# Patient Record
Sex: Male | Born: 1965 | Race: White | Hispanic: No | Smoking: Never smoker
Health system: Southern US, Community
[De-identification: ages and names within clinical notes are randomized; demographics above are authoritative.]

## PROBLEM LIST (undated history)

## (undated) DIAGNOSIS — J309 Allergic rhinitis, unspecified: Secondary | ICD-10-CM

## (undated) DIAGNOSIS — K5733 Diverticulitis of large intestine without perforation or abscess with bleeding: Secondary | ICD-10-CM

## (undated) DIAGNOSIS — K5790 Diverticulosis of intestine, part unspecified, without perforation or abscess without bleeding: Secondary | ICD-10-CM

## (undated) DIAGNOSIS — G4733 Obstructive sleep apnea (adult) (pediatric): Secondary | ICD-10-CM

## (undated) DIAGNOSIS — S82891A Other fracture of right lower leg, initial encounter for closed fracture: Secondary | ICD-10-CM

## (undated) DIAGNOSIS — R972 Elevated prostate specific antigen [PSA]: Secondary | ICD-10-CM

## (undated) DIAGNOSIS — R079 Chest pain, unspecified: Secondary | ICD-10-CM

## (undated) DIAGNOSIS — F411 Generalized anxiety disorder: Secondary | ICD-10-CM

## (undated) DIAGNOSIS — E785 Hyperlipidemia, unspecified: Secondary | ICD-10-CM

## (undated) DIAGNOSIS — R413 Other amnesia: Secondary | ICD-10-CM

## (undated) DIAGNOSIS — K219 Gastro-esophageal reflux disease without esophagitis: Secondary | ICD-10-CM

## (undated) DIAGNOSIS — I10 Essential (primary) hypertension: Secondary | ICD-10-CM

## (undated) DIAGNOSIS — F988 Other specified behavioral and emotional disorders with onset usually occurring in childhood and adolescence: Secondary | ICD-10-CM

## (undated) DIAGNOSIS — G4700 Insomnia, unspecified: Secondary | ICD-10-CM

## (undated) DIAGNOSIS — N529 Male erectile dysfunction, unspecified: Secondary | ICD-10-CM

## (undated) HISTORY — DX: Insomnia, unspecified: G47.00

## (undated) HISTORY — DX: Diverticulitis of large intestine without perforation or abscess with bleeding: K57.33

## (undated) HISTORY — DX: Obstructive sleep apnea (adult) (pediatric): G47.33

## (undated) HISTORY — DX: Gastro-esophageal reflux disease without esophagitis: K21.9

## (undated) HISTORY — DX: Other specified behavioral and emotional disorders with onset usually occurring in childhood and adolescence: F98.8

## (undated) HISTORY — DX: Diverticulosis of intestine, part unspecified, without perforation or abscess without bleeding: K57.90

## (undated) HISTORY — PX: TONSILLECTOMY: SUR1361

## (undated) HISTORY — DX: Generalized anxiety disorder: F41.1

## (undated) HISTORY — DX: Elevated prostate specific antigen (PSA): R97.20

## (undated) HISTORY — PX: NASAL SINUS SURGERY: SHX719

## (undated) HISTORY — DX: Hyperlipidemia, unspecified: E78.5

## (undated) HISTORY — DX: Chest pain, unspecified: R07.9

## (undated) HISTORY — DX: Other amnesia: R41.3

## (undated) HISTORY — DX: Allergic rhinitis, unspecified: J30.9

## (undated) HISTORY — DX: Male erectile dysfunction, unspecified: N52.9

---

## 2004-08-24 ENCOUNTER — Ambulatory Visit: Payer: Self-pay | Admitting: Internal Medicine

## 2005-01-13 ENCOUNTER — Encounter: Payer: Self-pay | Admitting: Pulmonary Disease

## 2005-01-13 ENCOUNTER — Ambulatory Visit (HOSPITAL_BASED_OUTPATIENT_CLINIC_OR_DEPARTMENT_OTHER): Admission: RE | Admit: 2005-01-13 | Discharge: 2005-01-13 | Payer: Self-pay | Admitting: Otolaryngology

## 2005-01-16 ENCOUNTER — Ambulatory Visit: Payer: Self-pay | Admitting: Internal Medicine

## 2005-02-22 ENCOUNTER — Ambulatory Visit: Payer: Self-pay | Admitting: Internal Medicine

## 2005-03-23 ENCOUNTER — Ambulatory Visit: Payer: Self-pay | Admitting: Internal Medicine

## 2005-05-10 ENCOUNTER — Ambulatory Visit: Payer: Self-pay | Admitting: Pulmonary Disease

## 2005-07-29 ENCOUNTER — Ambulatory Visit: Payer: Self-pay | Admitting: Internal Medicine

## 2005-08-16 ENCOUNTER — Ambulatory Visit: Payer: Self-pay | Admitting: Internal Medicine

## 2005-11-24 ENCOUNTER — Ambulatory Visit: Payer: Self-pay | Admitting: Internal Medicine

## 2006-06-02 ENCOUNTER — Ambulatory Visit: Payer: Self-pay | Admitting: Internal Medicine

## 2006-07-06 ENCOUNTER — Ambulatory Visit: Payer: Self-pay | Admitting: Internal Medicine

## 2006-07-14 ENCOUNTER — Ambulatory Visit: Payer: Self-pay | Admitting: Internal Medicine

## 2006-11-08 ENCOUNTER — Ambulatory Visit: Payer: Self-pay | Admitting: Internal Medicine

## 2007-05-02 ENCOUNTER — Ambulatory Visit: Payer: Self-pay | Admitting: Internal Medicine

## 2007-05-03 ENCOUNTER — Ambulatory Visit: Payer: Self-pay | Admitting: Internal Medicine

## 2007-05-03 DIAGNOSIS — F988 Other specified behavioral and emotional disorders with onset usually occurring in childhood and adolescence: Secondary | ICD-10-CM | POA: Insufficient documentation

## 2007-05-03 DIAGNOSIS — G4733 Obstructive sleep apnea (adult) (pediatric): Secondary | ICD-10-CM | POA: Insufficient documentation

## 2007-05-03 DIAGNOSIS — F411 Generalized anxiety disorder: Secondary | ICD-10-CM

## 2007-05-03 HISTORY — DX: Obstructive sleep apnea (adult) (pediatric): G47.33

## 2007-05-03 HISTORY — DX: Other specified behavioral and emotional disorders with onset usually occurring in childhood and adolescence: F98.8

## 2007-05-03 HISTORY — DX: Generalized anxiety disorder: F41.1

## 2007-05-29 ENCOUNTER — Encounter: Payer: Self-pay | Admitting: Internal Medicine

## 2007-07-31 ENCOUNTER — Ambulatory Visit: Payer: Self-pay | Admitting: Internal Medicine

## 2007-07-31 LAB — CONVERTED CEMR LAB
Albumin: 4.4 g/dL (ref 3.5–5.2)
Alkaline Phosphatase: 65 units/L (ref 39–117)
BUN: 9 mg/dL (ref 6–23)
Basophils Absolute: 0 10*3/uL (ref 0.0–0.1)
Bilirubin Urine: NEGATIVE
Creatinine, Ser: 1 mg/dL (ref 0.4–1.5)
Eosinophils Absolute: 0.1 10*3/uL (ref 0.0–0.6)
GFR calc Af Amer: 105 mL/min
GFR calc non Af Amer: 87 mL/min
HDL: 30.9 mg/dL — ABNORMAL LOW (ref >39.0)
Hemoglobin, Urine: NEGATIVE
Hemoglobin: 16.7 g/dL (ref 13.0–17.0)
Ketones, ur: NEGATIVE mg/dL
LDL Cholesterol: 94 mg/dL (ref 0–99)
Leukocytes, UA: NEGATIVE
MCHC: 34.4 g/dL (ref 30.0–36.0)
Monocytes Absolute: 0.6 10*3/uL (ref 0.2–0.7)
Monocytes Relative: 10.6 % (ref 3.0–11.0)
Potassium: 4.2 meq/L (ref 3.5–5.1)
RDW: 12.3 % (ref 11.5–14.6)
TSH: 2.46 microintl units/mL (ref 0.35–5.50)
Total Bilirubin: 0.9 mg/dL (ref 0.3–1.2)
Triglycerides: 113 mg/dL (ref 0–149)
VLDL: 23 mg/dL (ref 0–40)
pH: 7 (ref 5.0–8.0)

## 2007-08-01 ENCOUNTER — Encounter: Payer: Self-pay | Admitting: Internal Medicine

## 2007-08-01 ENCOUNTER — Ambulatory Visit: Payer: Self-pay | Admitting: Internal Medicine

## 2007-08-01 DIAGNOSIS — E785 Hyperlipidemia, unspecified: Secondary | ICD-10-CM | POA: Insufficient documentation

## 2007-08-01 HISTORY — DX: Hyperlipidemia, unspecified: E78.5

## 2007-10-17 ENCOUNTER — Ambulatory Visit: Payer: Self-pay | Admitting: Pulmonary Disease

## 2007-10-17 DIAGNOSIS — G4700 Insomnia, unspecified: Secondary | ICD-10-CM

## 2007-10-17 HISTORY — DX: Insomnia, unspecified: G47.00

## 2007-10-24 ENCOUNTER — Telehealth: Payer: Self-pay | Admitting: Internal Medicine

## 2007-10-24 ENCOUNTER — Telehealth: Payer: Self-pay | Admitting: Pulmonary Disease

## 2007-10-24 ENCOUNTER — Telehealth (INDEPENDENT_AMBULATORY_CARE_PROVIDER_SITE_OTHER): Payer: Self-pay | Admitting: *Deleted

## 2007-10-26 ENCOUNTER — Encounter: Payer: Self-pay | Admitting: Internal Medicine

## 2007-10-29 ENCOUNTER — Telehealth (INDEPENDENT_AMBULATORY_CARE_PROVIDER_SITE_OTHER): Payer: Self-pay | Admitting: *Deleted

## 2007-11-16 ENCOUNTER — Encounter: Payer: Self-pay | Admitting: Internal Medicine

## 2007-11-21 ENCOUNTER — Telehealth (INDEPENDENT_AMBULATORY_CARE_PROVIDER_SITE_OTHER): Payer: Self-pay | Admitting: *Deleted

## 2007-11-23 ENCOUNTER — Ambulatory Visit: Payer: Self-pay | Admitting: Internal Medicine

## 2007-11-23 DIAGNOSIS — R413 Other amnesia: Secondary | ICD-10-CM | POA: Insufficient documentation

## 2007-11-23 HISTORY — DX: Other amnesia: R41.3

## 2007-11-25 DIAGNOSIS — J309 Allergic rhinitis, unspecified: Secondary | ICD-10-CM

## 2007-11-25 HISTORY — DX: Allergic rhinitis, unspecified: J30.9

## 2007-11-26 ENCOUNTER — Encounter: Payer: Self-pay | Admitting: Pulmonary Disease

## 2007-11-26 ENCOUNTER — Encounter: Payer: Self-pay | Admitting: Internal Medicine

## 2008-02-12 ENCOUNTER — Telehealth (INDEPENDENT_AMBULATORY_CARE_PROVIDER_SITE_OTHER): Payer: Self-pay | Admitting: *Deleted

## 2008-02-20 ENCOUNTER — Telehealth: Payer: Self-pay | Admitting: Internal Medicine

## 2008-02-27 ENCOUNTER — Telehealth (INDEPENDENT_AMBULATORY_CARE_PROVIDER_SITE_OTHER): Payer: Self-pay | Admitting: *Deleted

## 2008-03-28 ENCOUNTER — Ambulatory Visit (HOSPITAL_COMMUNITY): Admission: RE | Admit: 2008-03-28 | Discharge: 2008-03-29 | Payer: Self-pay | Admitting: Otolaryngology

## 2008-06-27 ENCOUNTER — Ambulatory Visit: Payer: Self-pay | Admitting: Internal Medicine

## 2008-12-08 ENCOUNTER — Ambulatory Visit: Payer: Self-pay | Admitting: Internal Medicine

## 2008-12-08 DIAGNOSIS — J019 Acute sinusitis, unspecified: Secondary | ICD-10-CM | POA: Insufficient documentation

## 2009-09-10 ENCOUNTER — Ambulatory Visit: Payer: Self-pay | Admitting: Internal Medicine

## 2009-09-27 ENCOUNTER — Emergency Department (HOSPITAL_COMMUNITY): Admission: EM | Admit: 2009-09-27 | Discharge: 2009-09-27 | Payer: Self-pay | Admitting: Emergency Medicine

## 2009-10-06 ENCOUNTER — Encounter: Payer: Self-pay | Admitting: Internal Medicine

## 2009-11-29 ENCOUNTER — Encounter: Payer: Self-pay | Admitting: Internal Medicine

## 2010-01-01 ENCOUNTER — Ambulatory Visit: Payer: Self-pay | Admitting: Internal Medicine

## 2010-01-01 LAB — CONVERTED CEMR LAB
ALT: 31 units/L (ref 0–53)
Albumin: 4.2 g/dL (ref 3.5–5.2)
Basophils Relative: 0.3 % (ref 0.0–3.0)
CO2: 30 meq/L (ref 19–32)
Calcium: 9.1 mg/dL (ref 8.4–10.5)
Chloride: 106 meq/L (ref 96–112)
Cholesterol: 152 mg/dL (ref 0–200)
Creatinine, Ser: 1 mg/dL (ref 0.4–1.5)
Eosinophils Relative: 1 % (ref 0.0–5.0)
Glucose, Bld: 92 mg/dL (ref 70–99)
HCT: 49.2 % (ref 39.0–52.0)
Hemoglobin, Urine: NEGATIVE
Hemoglobin: 17.1 g/dL — ABNORMAL HIGH (ref 13.0–17.0)
LDL Cholesterol: 83 mg/dL (ref 0–99)
Lymphocytes Relative: 18.6 % (ref 12.0–46.0)
Lymphs Abs: 1.2 10*3/uL (ref 0.7–4.0)
Monocytes Relative: 10.2 % (ref 3.0–12.0)
Neutro Abs: 4.5 10*3/uL (ref 1.4–7.7)
Nitrite: NEGATIVE
RBC: 5.49 M/uL (ref 4.22–5.81)
Specific Gravity, Urine: 1.02 (ref 1.000–1.030)
Total CHOL/HDL Ratio: 4
Total Protein: 6.8 g/dL (ref 6.0–8.3)
Triglycerides: 148 mg/dL (ref 0.0–149.0)
Urine Glucose: NEGATIVE mg/dL
Urobilinogen, UA: 0.2 (ref 0.0–1.0)
WBC: 6.4 10*3/uL (ref 4.5–10.5)

## 2010-01-06 ENCOUNTER — Ambulatory Visit: Payer: Self-pay | Admitting: Internal Medicine

## 2010-01-06 DIAGNOSIS — N529 Male erectile dysfunction, unspecified: Secondary | ICD-10-CM

## 2010-01-06 DIAGNOSIS — R972 Elevated prostate specific antigen [PSA]: Secondary | ICD-10-CM | POA: Insufficient documentation

## 2010-01-06 HISTORY — DX: Male erectile dysfunction, unspecified: N52.9

## 2010-01-06 HISTORY — DX: Elevated prostate specific antigen (PSA): R97.20

## 2010-07-27 ENCOUNTER — Ambulatory Visit: Payer: Self-pay | Admitting: Internal Medicine

## 2010-07-27 LAB — CONVERTED CEMR LAB: PSA: 0.44 ng/mL (ref 0.10–4.00)

## 2010-07-28 ENCOUNTER — Ambulatory Visit: Payer: Self-pay | Admitting: Internal Medicine

## 2010-07-28 DIAGNOSIS — R079 Chest pain, unspecified: Secondary | ICD-10-CM

## 2010-07-28 DIAGNOSIS — H6690 Otitis media, unspecified, unspecified ear: Secondary | ICD-10-CM | POA: Insufficient documentation

## 2010-07-28 HISTORY — DX: Chest pain, unspecified: R07.9

## 2010-07-31 DIAGNOSIS — K219 Gastro-esophageal reflux disease without esophagitis: Secondary | ICD-10-CM | POA: Insufficient documentation

## 2010-07-31 HISTORY — DX: Gastro-esophageal reflux disease without esophagitis: K21.9

## 2010-11-04 NOTE — Assessment & Plan Note (Signed)
Summary: CPX /NWS  #   Vital Signs:  Patient profile:   45 year old male Height:      68 inches Weight:      179.25 pounds BMI:     27.35 O2 Sat:      95 % on Room air Temp:     97.6 degrees F oral Pulse rate:   98 / minute BP sitting:   110 / 82  (left arm) Cuff size:   regular  Vitals Entered ByZella Ball Ewing (January 06, 2010 10:48 AM)  O2 Flow:  Room air  CC: Adult Physical/RE   CC:  Adult Physical/RE.  History of Present Illness: excercises daily now with good HDL improvement;  using the generic flonase and doing well;  Pt denies CP, sob, doe, wheezing, orthopnea, pnd, worsening LE edema, palps, dizziness or syncope  Pt denies new neuro symptoms such as headache, facial or extremity weakness   But has had mild facial pain, pressure and now fever today with greenish d/c.    Preventive Screening-Counseling & Management      Drug Use:  no.    Problems Prior to Update: 1)  Psa, Increased  (ICD-790.93) 2)  Erectile Dysfunction, Organic  (ICD-607.84) 3)  Sinusitis- Acute-nos  (ICD-461.9) 4)  Sinusitis- Acute-nos  (ICD-461.9) 5)  Allergic Rhinitis  (ICD-477.9) 6)  Memory Loss  (ICD-780.93) 7)  Insomnia  (ICD-780.52) 8)  Preventive Health Care  (ICD-V70.0) 9)  Hyperlipidemia  (ICD-272.4) 10)  Add  (ICD-314.00) 11)  Anxiety  (ICD-300.00) 12)  Sleep Apnea, Obstructive, Severe  (ICD-327.23)  Medications Prior to Update: 1)  Alprazolam 0.5 Mg  Tabs (Alprazolam) .Marland Kitchen.. 1 - 2 By Mouth Three Times A Day As Needed 2)  Fluticasone Propionate 50 Mcg/act Susp (Fluticasone Propionate) .... Use 2 Spray Each Nostril Once Daily 3)  Cephalexin 500 Mg Caps (Cephalexin) .Marland Kitchen.. 1 By Mouth Three Times A Day 4)  Promethazine-Codeine 6.25-10 Mg/80ml Syrp (Promethazine-Codeine) .Marland Kitchen.. 1 Tsp By Mouth Q 6 Hrs As Needed Cough  Current Medications (verified): 1)  Fluticasone Propionate 50 Mcg/act Susp (Fluticasone Propionate) .... Use 2 Spray Each Nostril Once Daily 2)  Cephalexin 500 Mg Caps  (Cephalexin) .Marland Kitchen.. 1 By Mouth Three Times A Day 3)  Viagra 100 Mg Tabs (Sildenafil Citrate) .Marland Kitchen.. 1po Every Other Day As Needed  Allergies (verified): 1)  Augmentin  Past History:  Past Medical History: Last updated: 11/23/2007 Obstructive Sleep Apnea Anxiety H/O ADD Hyperlipidemia Allergic rhinitis  Past Surgical History: Last updated: 05/03/2007 Tonsillectomy  Family History: Last updated: 08/01/2007 grandfather with prostate cancer  Social History: Last updated: 01/06/2010 Never Smoked Alcohol use-yes Married Drug use-no  Risk Factors: Smoking Status: never (08/01/2007)  Social History: Never Smoked Alcohol use-yes Married Drug use-no Drug Use:  no  Review of Systems  The patient denies anorexia, fever, vision loss, decreased hearing, hoarseness, chest pain, syncope, dyspnea on exertion, peripheral edema, prolonged cough, headaches, hemoptysis, abdominal pain, melena, hematochezia, severe indigestion/heartburn, hematuria, muscle weakness, suspicious skin lesions, difficulty walking, depression, unusual weight change, abnormal bleeding, enlarged lymph nodes, and angioedema.         all otherwise negative per pt -    Physical Exam  General:  alert and well-developed.   Head:  normocephalic and atraumatic.   Eyes:  vision grossly intact, pupils equal, and pupils round.   Ears:  bilat tm's red, sinus tender bilat Nose:  nasal dischargemucosal pallor and mucosal edema.   Mouth:  pharyngeal erythema and fair dentition.  Neck:  supple and no masses.   Lungs:  normal respiratory effort and normal breath sounds.   Heart:  normal rate and regular rhythm.   Abdomen:  soft, non-tender, and normal bowel sounds.   Msk:  no joint tenderness and no joint swelling.   Extremities:  no edema, no erythema  Neurologic:  cranial nerves II-XII intact and strength normal in all extremities.     Impression & Recommendations:  Problem # 1:  Preventive Health Care  (ICD-V70.0) Overall doing well, age appropriate education and counseling updated and referral for appropriate preventive services done unless declined, immunizations up to date or declined, diet counseling done if overweight, urged to quit smoking if smokes , most recent labs reviewed and current ordered if appropriate, ecg reviewed or declined (interpretation per ECG scanned in the EMR if done); information regarding Medicare Prevention requirements given if appropriate  Orders: EKG w/ Interpretation (93000)  Problem # 2:  SINUSITIS- ACUTE-NOS (ICD-461.9)  The following medications were removed from the medication list:    Promethazine-codeine 6.25-10 Mg/8ml Syrp (Promethazine-codeine) .Marland Kitchen... 1 tsp by mouth q 6 hrs as needed cough His updated medication list for this problem includes:    Fluticasone Propionate 50 Mcg/act Susp (Fluticasone propionate) ..... Use 2 spray each nostril once daily    Cephalexin 500 Mg Caps (Cephalexin) .Marland Kitchen... 1 by mouth three times a day treat as above, f/u any worsening signs or symptoms   Problem # 3:  ERECTILE DYSFUNCTION, ORGANIC (ICD-607.84)  His updated medication list for this problem includes:    Viagra 100 Mg Tabs (Sildenafil citrate) .Marland Kitchen... 1po every other day as needed treat as above, f/u any worsening signs or symptoms   Problem # 4:  PSA, INCREASED (ICD-790.93) somewhat suspic increased PSA - to f/u PSA only in 6 mo  Complete Medication List: 1)  Fluticasone Propionate 50 Mcg/act Susp (Fluticasone propionate) .... Use 2 spray each nostril once daily 2)  Cephalexin 500 Mg Caps (Cephalexin) .Marland Kitchen.. 1 by mouth three times a day 3)  Viagra 100 Mg Tabs (Sildenafil citrate) .Marland Kitchen.. 1po every other day as needed  Patient Instructions: 1)  please return in 6 mo for LAB only:   2)  PSA 790.3 3)  Please take all new medications as prescribed 4)  Continue all previous medications as before this visit  5)  Please schedule a follow-up appointment in 1 year or  sooner if needed Prescriptions: VIAGRA 100 MG TABS (SILDENAFIL CITRATE) 1po every other day as needed  #5 x 11   Entered and Authorized by:   Corwin Levins MD   Signed by:   Corwin Levins MD on 01/06/2010   Method used:   Print then Give to Patient   RxID:   [id number] FLUTICASONE PROPIONATE 50 MCG/ACT SUSP (FLUTICASONE PROPIONATE) USE 2 SPRAY EACH NOSTRIL once daily  #1 x 11   Entered and Authorized by:   Corwin Levins MD   Signed by:   Corwin Levins MD on 01/06/2010   Method used:   Print then Give to Patient   RxID:   [id number] CEPHALEXIN 500 MG CAPS (CEPHALEXIN) 1 by mouth three times a day  #30 x 0   Entered and Authorized by:   Corwin Levins MD   Signed by:   Corwin Levins MD on 01/06/2010   Method used:   Print then Give to Patient   RxID:   [id number]

## 2010-11-04 NOTE — Assessment & Plan Note (Signed)
Summary: EARACHE  SORE THROAT  CHEST CONGEST--STC   Vital Signs:  Patient profile:   45 year old male Height:      68 inches Weight:      180.25 pounds O2 Sat:      96 % Temp:     97 degrees F Pulse rate:   80 / minute BP sitting:   120 / 72  (left arm) Cuff size:   regular  Vitals Entered By: Jarome Lamas (July 28, 2010 11:47 AM) CC: earache, sore throat, chest congestion/ pb Comments pt no longer taking cephalexin and viagra/ pb   CC:  earache, sore throat, and chest congestion/ pb.  History of Present Illness: here with acute c/o; had flu shot yesterday, and today c/o fever, ST , milid to mod right ear pain, pressure, and slight decreaed hearing, without vertigo or n/v or sinus pain, pressure, d/c.  Also with mild left upper chest sharp pleuritic pains this am, fleeting in nature, without radiation, n/v, diaphoresis, sob or doe.  Has had occasional dizziness and palpitations this am as well.  Wanted to go over PSA today that he just had done in f/u yesterday; also mentions reflux symptoms not well controlled with daily breathrough despite dietary precautions and no signficant wt gain.  Pt denies other CP, worsening sob, doe, wheezing, orthopnea, pnd, worsening LE edema, palps, or syncope .  No recent  wt loss, night sweats, loss of appetite or other constitutional symptoms .  Pt denies new neuro symptoms such as headache, facial or extremity weakness . Denies GU symtpoms such as dysuria, freq, urgency or hematuria; and no other GI such as dysphagia, other abd pain, bowel change or blood.   Problems Prior to Update: 1)  Psa, Increased  (ICD-790.93) 2)  Erectile Dysfunction, Organic  (ICD-607.84) 3)  Sinusitis- Acute-nos  (ICD-461.9) 4)  Sinusitis- Acute-nos  (ICD-461.9) 5)  Allergic Rhinitis  (ICD-477.9) 6)  Memory Loss  (ICD-780.93) 7)  Insomnia  (ICD-780.52) 8)  Preventive Health Care  (ICD-V70.0) 9)  Hyperlipidemia  (ICD-272.4) 10)  Add  (ICD-314.00) 11)  Anxiety   (ICD-300.00) 12)  Sleep Apnea, Obstructive, Severe  (ICD-327.23)  Medications Prior to Update: 1)  Fluticasone Propionate 50 Mcg/act Susp (Fluticasone Propionate) .... Use 2 Spray Each Nostril Once Daily 2)  Cephalexin 500 Mg Caps (Cephalexin) .Marland Kitchen.. 1 By Mouth Three Times A Day 3)  Viagra 100 Mg Tabs (Sildenafil Citrate) .Marland Kitchen.. 1po Every Other Day As Needed  Current Medications (verified): 1)  Fluticasone Propionate 50 Mcg/act Susp (Fluticasone Propionate) .... Use 2 Spray Each Nostril Once Daily 2)  Levofloxacin 500 Mg Tabs (Levofloxacin) .Marland Kitchen.. 1po Once Daily 3)  Omeprazole 20 Mg Cpdr (Omeprazole) .... 2 By Mouth Once Daily 4)  Alprazolam 0.5 Mg Tabs (Alprazolam) .... Per Neurology  Allergies (verified): 1)  Augmentin  Past History:  Past Surgical History: Last updated: 05/03/2007 Tonsillectomy  Social History: Last updated: 01/06/2010 Never Smoked Alcohol use-yes Married Drug use-no  Risk Factors: Smoking Status: never (08/01/2007)  Past Medical History: Obstructive Sleep Apnea Anxiety H/O ADD Hyperlipidemia Allergic rhinitis GERD  Review of Systems       all otherwise negative per pt -    Physical Exam  General:  alert and well-developed.  , mild ill  Head:  normocephalic and atraumatic.   Eyes:  vision grossly intact, pupils equal, and pupils round.   Ears:  right tm mod to severe erythema, sinus nontender Nose:  nasal dischargemucosal pallor and mucosal edema.   Mouth:  pharyngeal erythema and fair dentition.   Neck:  cervical lymphadenopathy.   Lungs:  normal respiratory effort and normal breath sounds.   Heart:  normal rate and regular rhythm.   Abdomen:  soft, non-tender, and normal bowel sounds.   Rectal:  deferred Msk:  no chest wall tender, no joint tenderness.   Extremities:  no edema, no erythema    Impression & Recommendations:  Problem # 1:  OTITIS MEDIA, ACUTE, RIGHT (ICD-382.9)  His updated medication list for this problem includes:     Levofloxacin 500 Mg Tabs (Levofloxacin) .Marland Kitchen... 1po once daily treat as above, f/u any worsening signs or symptoms   Problem # 2:  PSA, INCREASED (ICD-790.93) d/w pt - PSA improved, ok to hold further referral for now,  f/u in 6 months, ow pt asympt  Problem # 3:  CHEST PAIN (ICD-786.50) c/w pleurisy vs MSK - mild, atypical for cardiac, delcines ECG, ok to follow for now, exam o/w benign  Problem # 4:  GERD (ICD-530.81)  His updated medication list for this problem includes:    Omeprazole 20 Mg Cpdr (Omeprazole) .Marland Kitchen... 2 by mouth once daily treat as above, f/u any worsening signs or symptoms   Complete Medication List: 1)  Fluticasone Propionate 50 Mcg/act Susp (Fluticasone propionate) .... Use 2 spray each nostril once daily 2)  Levofloxacin 500 Mg Tabs (Levofloxacin) .Marland Kitchen.. 1po once daily 3)  Omeprazole 20 Mg Cpdr (Omeprazole) .... 2 by mouth once daily 4)  Alprazolam 0.5 Mg Tabs (Alprazolam) .... Per neurology  Patient Instructions: 1)  Please take all new medications as prescribed 2)  Continue all previous medications as before this visit  3)  You will be contacted about the referral(s) to: stress test 4)  Please schedule a follow-up appointment in 6 months with CPX labs Prescriptions: OMEPRAZOLE 20 MG CPDR (OMEPRAZOLE) 2 by mouth once daily  #180 x 3   Entered and Authorized by:   Corwin Levins MD   Signed by:   Corwin Levins MD on 07/28/2010   Method used:   Print then Give to Patient   RxID:   [id number] LEVOFLOXACIN 500 MG TABS (LEVOFLOXACIN) 1po once daily  #10 x 0   Entered and Authorized by:   Corwin Levins MD   Signed by:   Corwin Levins MD on 07/28/2010   Method used:   Print then Give to Patient   RxID:   [id number]    Orders Added: 1)  Est. Patient Level IV [84696]

## 2010-11-04 NOTE — Assessment & Plan Note (Signed)
Summary: FLU SHOT/JSS   Nurse Visit   Allergies: 1)  Augmentin  Orders Added: 1)  Admin 1st Vaccine [90471] 2)  Flu Vaccine 36yrs + [16109] Flu Vaccine Consent Questions     Do you have a history of severe allergic reactions to this vaccine? no    Any prior history of allergic reactions to egg and/or gelatin? no    Do you have a sensitivity to the preservative Thimersol? no    Do you have a past history of Guillan-Barre Syndrome? no    Do you currently have an acute febrile illness? no    Have you ever had a severe reaction to latex? no    Vaccine information given and explained to patient? yes    Are you currently pregnant? no    Lot Number:[id number]   Exp Date:04/02/2011   Site Given  Left Deltoid IMu Vaccine 51yrs + [60454] .lbflu

## 2010-11-04 NOTE — Letter (Signed)
Summary: [facility]  [facility]   Imported By: Lester [place] 10/15/2009 11:57:14  _____________________________________________________________________  External Attachment:    Type:   Image     Comment:   External Document

## 2011-02-15 NOTE — Op Note (Signed)
NAME:  Nathaniel [name], Nathaniel [name] NO.:  [id number]   MEDICAL RECORD NO.:  [id number]          PATIENT TYPE:  OIB   LOCATION:  3316                         FACILITY:  [facility]   PHYSICIAN:  Suzanna Obey, M.D.       DATE OF BIRTH:  07-08-1966   DATE OF PROCEDURE:  03/28/2008  DATE OF DISCHARGE:                               OPERATIVE REPORT   PREOPERATIVE DIAGNOSES:  1. Deviated septum.  2. Turbinate hypertrophy.   POSTOPERATIVE DIAGNOSES:  1. Deviated septum.  2. Turbinate hypertrophy.   SURGICAL PROCEDURE:  Septoplasty and submucous resection of the inferior  turbinates.   ANESTHESIA:  General.   ESTIMATED BLOOD LOSS:  Less than 10 mL.   INDICATIONS:  This is a 45 year old with a significant amount of  obstructive sleep apnea with nasal obstruction and congestion.  He is  really struggling to try to perform the CPAP and does not have the  anatomy of the remaining oral cavity to justify a surgical intervention  for that.  He is informed the risks and benefits of the procedure and  options were discussed.  All questions were answered and consent was  obtained.   OPERATION:  The patient was taken to the operating room and placed in  supine position.  After adequate general endotracheal tube anesthesia,  he was placed in the supine position and prepped and draped in the usual  sterile manner.  Oxymetazoline pledgets were placed into the nose  bilaterally and then the septum inferior turbinates were injected with  1% lidocaine with 1:100,000 epinephrine.  Right hemitransfixion incision  was performed, raising the mucoperichondrial and ostial flap.  The  cartilage was divided about 1.5-cm posterior to the caudal strut and the  deviated portion of the cartilage was removed.  The Jansen-Middleton  forceps were used to remove the deviated portion of the bone.  This  corrected the septal deflection.  Turbinates were infractured.  Midline  incision made with a 15-blade.  Mucosal  flap elevated superiorly and the  inferior mucosa and bone were removed with the turbinate scissors.  The  edge was cauterized with suction cautery and flaps were laid back down  and outfractured both turbinates.  Hemitransfixion incision closed with  interrupted 4-0 chromic and a 4-0 plain gut quilting stitch placed to  the septum.  There was excellent hemostasis, so no packing was placed.  The nasopharynx was suctioned out of all blood and debris.  The patient  was awakened and brought to the recovery room in stable condition.  Counts were correct.           ______________________________  Suzanna Obey, M.D.    JB/MEDQ  D:  03/28/2008  T:  03/28/2008  Job:  [id number]

## 2011-02-18 NOTE — Procedures (Signed)
NAME:  Nathaniel [name], Nathaniel [name] NO.:  [id number]   MEDICAL RECORD NO.:  [id number]          PATIENT TYPE:  OUT   LOCATION:  SLEEP CENTER                 FACILITY:  [facility]   PHYSICIAN:  Clinton D. Maple Hudson, M.D. DATE OF BIRTH:  Jun 18, 1966   DATE OF STUDY:  01/13/2005                              NOCTURNAL POLYSOMNOGRAM   REFERRING PHYSICIAN:  Onalee Hua L. Annalee Genta, M.D.   INDICATIONS FOR STUDY:  Hypersomnia with sleep apnea. Epworth sleepiness  score 5/24, BMI 24, weight 164 pounds.   SLEEP ARCHITECTURE:  Total sleep time 336 minutes with sleep efficiency of  75%. Stage I was 13%, stage II was 71%, stages III and IV were 3%, REM was  13% of total sleep time. Sleep latency was 13 minutes. REM latency 238  minutes. Awake after sleep onset 98 minutes. Arousal index increased at 48.  Ambien was taken at 22:30 p.m.   RESPIRATORY DATA:  Split study protocol.  Respiratory disturbance index  (RDI, AHI) 57 obstructive events per hour indicating severe obstructive  sleep apnea/hypopnea syndrome. This included 87 obstructive apneas, 8  central apneas, 6 mixed apneas, and 24 hypopneas before CPAP control. Most  events were recorded while sleeping supine. REM RDI 4 per hour. CPAP was  titrated to 9 CWP, RDI 2.6 per hour using a medium ResMed Ultra Mirage full  face mask with heated humidifier.   OXYGEN DATA:  Mild to moderate snoring with oxygen desaturation to a nadir  of 74% before CPAP control. After CPAP saturation held 95% to 98% on room  air.   CARDIAC DATA:  Normal sinus rhythm.   MOVEMENT/PARASOMNIA:  Rare leg jerk.   IMPRESSION/RECOMMENDATIONS:  1.  Moderately severe obstructive sleep apnea/hypopnea syndrome, RDI of 57      per hour with moderate snoring and oxygen desaturation to 74%.  2.  Successful CPAP titration to 9 CWP, RDI 2.6 per hour using a medium      ResMed Ultra Mirage full face mask with heated humidifier.     CDY/MEDQ  D:  01/16/2005 17:01:29  T:  01/16/2005  21:35:07  Job:  [id number]

## 2011-06-30 LAB — CBC
HCT: 47.2
Platelets: 213
RDW: 13
WBC: 6.2

## 2011-09-12 ENCOUNTER — Encounter: Payer: Self-pay | Admitting: Internal Medicine

## 2011-09-12 DIAGNOSIS — Z0001 Encounter for general adult medical examination with abnormal findings: Secondary | ICD-10-CM | POA: Insufficient documentation

## 2011-09-12 DIAGNOSIS — Z0000 Encounter for general adult medical examination without abnormal findings: Secondary | ICD-10-CM | POA: Insufficient documentation

## 2011-09-13 ENCOUNTER — Encounter: Payer: Self-pay | Admitting: Internal Medicine

## 2011-09-13 ENCOUNTER — Ambulatory Visit (INDEPENDENT_AMBULATORY_CARE_PROVIDER_SITE_OTHER): Payer: BC Managed Care – PPO | Admitting: Internal Medicine

## 2011-09-13 VITALS — BP 120/80 | HR 79 | Temp 98.7°F | Ht 68.0 in | Wt 174.0 lb

## 2011-09-13 DIAGNOSIS — F411 Generalized anxiety disorder: Secondary | ICD-10-CM

## 2011-09-13 DIAGNOSIS — Z0000 Encounter for general adult medical examination without abnormal findings: Secondary | ICD-10-CM

## 2011-09-13 MED ORDER — ALPRAZOLAM 0.5 MG PO TABS: 0.5000 mg | ORAL_TABLET | Freq: Every evening | ORAL | Status: DC | PRN

## 2011-09-13 MED ORDER — FLUTICASONE PROPIONATE 50 MCG/ACT NA SUSP: 2.0000 | Freq: Every day | NASAL | Status: DC

## 2011-09-13 NOTE — Assessment & Plan Note (Signed)
?    Bipolar per recent psychiatry eval,  Pt plans to seek second opinion

## 2011-09-13 NOTE — Assessment & Plan Note (Signed)
Overall doing well, age appropriate education and counseling updated, referrals for preventative services and immunizations addressed, dietary and smoking counseling addressed, most recent labs and ECG reviewed.  I have personally reviewed and have noted: 1) the patient's medical and social history 2) The pt's use of alcohol, tobacco, and illicit drugs 3) The patient's current medications and supplements 4) Functional ability including ADL's, fall risk, home safety risk, hearing and visual impairment 5) Diet and physical activities 6) Evidence for depression or mood disorder 7) The patient's height, weight, and BMI have been recorded in the chart I have made referrals, and provided counseling and education based on review of the above ECG reviewed as per emr For blood work today

## 2011-09-13 NOTE — Patient Instructions (Addendum)
You had the flu shot today Your EKG was normal Please go to LAB in the Basement for the blood and/or urine tests to be done today Please call the phone number [phone] (the PhoneTree System) for results of testing in 2-3 days;  When calling, simply dial the number, and when prompted enter the MRN number above (the Medical Record Number) and the # key, then the message should start. Your medications were refilled today (the nasal spray was sent to the pharmacy) Please consider Dr Emerson Monte for second opinion Please return in 1 year for your yearly visit, or sooner if needed, with Lab testing done 3-5 days before

## 2011-09-14 ENCOUNTER — Telehealth: Payer: Self-pay

## 2011-09-14 MED ORDER — BENZONATATE 100 MG PO CAPS: ORAL_CAPSULE | ORAL | Status: DC

## 2011-09-14 MED ORDER — OSELTAMIVIR PHOSPHATE 75 MG PO CAPS: 75.0000 mg | ORAL_CAPSULE | Freq: Two times a day (BID) | ORAL | Status: AC

## 2011-09-14 NOTE — Telephone Encounter (Signed)
Patient informed. 

## 2011-09-14 NOTE — Telephone Encounter (Signed)
Ok for tamiflu course for prevention/tx

## 2011-09-14 NOTE — Telephone Encounter (Signed)
Pt called stating that his daughter was Dx with the flu yesterday. Pt is concerned because his sxs have gotten worse; body pain, chills, cough. Pt denies ST or fever. Pt is requesting advisement from MD, could he possible be at beginning stages of flu? Is there something he should be taking as a precaution? Please advise,

## 2011-09-14 NOTE — Telephone Encounter (Signed)
No common serious side effect;  Complete list can be obtained at the pharmacy  tamiflu is an antibiotic, just for viral infection;  No other antibiotic needed  OK for tesssalon perle for cough - done per emr

## 2011-09-14 NOTE — Telephone Encounter (Signed)
Patient was called in Nathaniel [name] today and would like to know the side affects if any. Also would like an antibiotic and cough medication as has bad cough. He did inform would understand if needed an OV, please advise

## 2011-09-15 NOTE — Telephone Encounter (Signed)
Patient informed. 

## 2011-09-17 ENCOUNTER — Encounter: Payer: Self-pay | Admitting: Family Medicine

## 2011-09-17 ENCOUNTER — Ambulatory Visit (INDEPENDENT_AMBULATORY_CARE_PROVIDER_SITE_OTHER): Payer: BC Managed Care – PPO | Admitting: Family Medicine

## 2011-09-17 VITALS — BP 120/82 | HR 77 | Temp 98.0°F | Ht 68.0 in | Wt 175.0 lb

## 2011-09-17 DIAGNOSIS — J40 Bronchitis, not specified as acute or chronic: Secondary | ICD-10-CM

## 2011-09-17 MED ORDER — AZITHROMYCIN 250 MG PO TABS: 250.0000 mg | ORAL_TABLET | Freq: Every day | ORAL | Status: AC

## 2011-09-17 NOTE — Assessment & Plan Note (Signed)
Given duration of cough and change in character will start abx.  Reviewed supportive care and red flags that should prompt return. Pt expressed understanding and is in agreement w/ plan.

## 2011-09-17 NOTE — Progress Notes (Signed)
  Subjective:    Patient ID: Nathaniel [name], male    DOB: [birth year], 45 y.o.   MRN: [id number]  HPI ? Sinus infxn- saw Dr Jonny Ruiz on Monday for CPE, was started on Tamiflu b/c daughter had flu.  sxs started Monday afternoon.  + fever and chills.  Fever broke Thursday AM and was feeling 'a lot better'.  Now w/ cough productive of green sputum, + ear pain, nasal congestion, sinus pressure.  Total cough duration- 3 weeks.   Review of Systems For ROS see HPI     Objective:   Physical Exam  Vitals reviewed. Constitutional: He appears well-developed and well-nourished. No distress.  HENT:  Head: Normocephalic and atraumatic.  Right Ear: Tympanic membrane normal.  Left Ear: Tympanic membrane normal.  Nose: No mucosal edema or rhinorrhea. Right sinus exhibits no maxillary sinus tenderness and no frontal sinus tenderness. Left sinus exhibits no maxillary sinus tenderness and no frontal sinus tenderness.  Mouth/Throat: Mucous membranes are normal. No oropharyngeal exudate, posterior oropharyngeal edema or posterior oropharyngeal erythema.  Eyes: Conjunctivae and EOM are normal. Pupils are equal, round, and reactive to light.  Neck: Normal range of motion. Neck supple.  Cardiovascular: Normal rate, regular rhythm and normal heart sounds.   Pulmonary/Chest: Effort normal and breath sounds normal. No respiratory distress. He has no wheezes.       + hacking cough  Lymphadenopathy:    He has no cervical adenopathy.  Skin: Skin is warm and dry.          Assessment & Plan:

## 2011-09-17 NOTE — Patient Instructions (Signed)
Start the Zpack for the bronchitis Add Mucinex to thin your congestion Tessalon as needed for cough Drink plenty of fluids REST! Hang in there! Happy Holidays!

## 2011-09-18 ENCOUNTER — Encounter: Payer: Self-pay | Admitting: Internal Medicine

## 2011-09-18 NOTE — Progress Notes (Signed)
Subjective:    Patient ID: Nathaniel [name], male    DOB: [birth year], 45 y.o.   MRN: [id number]  HPI  Here for wellness and f/u;  Overall doing ok;  Pt denies CP, worsening SOB, DOE, wheezing, orthopnea, PND, worsening LE edema, palpitations, dizziness or syncope.  Pt denies neurological change such as new Headache, facial or extremity weakness.  Pt denies polydipsia, polyuria, or low sugar symptoms. Pt states overall good compliance with treatment and medications, good tolerability, and trying to follow lower cholesterol diet.  Pt denies worsening depressive symptoms, suicidal ideation or panic. No fever, wt loss, night sweats, loss of appetite, or other constitutional symptoms.  Pt states good ability with ADL's, low fall risk, home safety reviewed and adequate, no significant changes in hearing or vision, and occasionally active with exercise.  Has some concern that it ws suggested he may have bipolar by a psychiatrist and is considering a second opinion Past Medical History  Diagnosis Date  . ADD 05/03/2007  . ALLERGIC RHINITIS 11/25/2007  . ANXIETY 05/03/2007  . CHEST PAIN 07/28/2010  . 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 GERD 07/31/2010  . HYPERLIPIDEMIA 08/01/2007  . INSOMNIA 10/17/2007  . Memory loss 11/23/2007  . PSA, INCREASED 01/06/2010  . SLEEP APNEA, OBSTRUCTIVE, SEVERE 05/03/2007   Past Surgical History  Procedure Date  . Tonsillectomy     reports that he has never smoked. He does not have any smokeless tobacco history on file. He reports that he drinks alcohol. He reports that he does not use illicit drugs. family history includes Cancer in his maternal grandfather. Allergies  Allergen Reactions  . BJY:NWGNFAOZHYQ+MVHQIONGE+XBMWUXLKGM Acid+Aspartame     REACTION: nausea/GI upset   No current outpatient prescriptions on file prior to visit.   Review of Systems Review of Systems  Constitutional: Negative for diaphoresis, activity change, appetite change and unexpected weight  change.  HENT: Negative for hearing loss, ear pain, facial swelling, mouth sores and neck stiffness.   Eyes: Negative for pain, redness and visual disturbance.  Respiratory: Negative for shortness of breath and wheezing.   Cardiovascular: Negative for chest pain and palpitations.  Gastrointestinal: Negative for diarrhea, blood in stool, abdominal distention and rectal pain.  Genitourinary: Negative for hematuria, flank pain and decreased urine volume.  Musculoskeletal: Negative for myalgias and joint swelling.  Skin: Negative for color change and wound.  Neurological: Negative for syncope and numbness.  Hematological: Negative for adenopathy.  Psychiatric/Behavioral: Negative for hallucinations, self-injury, decreased concentration and agitation.      Objective:   Physical Exam BP 120/80  Pulse 79  Temp(Src) 98.7 F (37.1 C) (Oral)  Ht 5\' 8"  (1.727 m)  Wt 174 lb (78.926 kg)  BMI 26.46 kg/m2  SpO2 97% Physical Exam  VS noted Constitutional: Pt is oriented to person, place, and time. Appears well-developed and well-nourished.  HENT:  Head: Normocephalic and atraumatic.  Right Ear: External ear normal.  Left Ear: External ear normal.  Nose: Nose normal.  Mouth/Throat: Oropharynx is clear and moist.  Eyes: Conjunctivae and EOM are normal. Pupils are equal, round, and reactive to light.  Neck: Normal range of motion. Neck supple. No JVD present. No tracheal deviation present.  Cardiovascular: Normal rate, regular rhythm, normal heart sounds and intact distal pulses.   Pulmonary/Chest: Effort normal and breath sounds normal.  Abdominal: Soft. Bowel sounds are normal. There is no tenderness.  Musculoskeletal: Normal range of motion. Exhibits no edema.  Lymphadenopathy:  Has no cervical adenopathy.  Neurological: Pt is alert  and oriented to person, place, and time. Pt has normal reflexes. No cranial nerve deficit.  Skin: Skin is warm and dry. No rash noted.  Psychiatric:   Behavior  is normal. 1+ nervous    Assessment & Plan:

## 2011-09-19 ENCOUNTER — Telehealth: Payer: Self-pay

## 2011-09-19 NOTE — Telephone Encounter (Signed)
[facility] Triage Call Report Triage Record Num: [id number] Operator: Chevis Pretty Patient Name: Nathaniel [name] Call Date & Time: 09/17/2011 8:29:37AM Patient Phone: [phone] PCP: Patient Gender: Male PCP Fax : Patient DOB: 1966-01-23 Practice Name: [facility] Reason for Call: Caller: Giovannie/Patient; PCP: Oliver Barre; CB#: [phone]; Call Reason: Cough/Congestion; Sx Onset: 09/10/2011; Sx Notes: Seen in office 09/12/11 for cold symptoms. Since that visit, daughter was diagnosed with flu, and office called in Tamiflu for him 09/14/11. States has continued to have increasing green nasal drainage, and now earache. Emergent symptoms denied; to be seen within 24 hours.; Afebrile; Home treatment(s) tried: Fluids, Acetaminophen, ; Did home treatment help?: Yes; Guideline Used: Cough, Adult; Disp: See/24; Appt Scheduled?: No; patient to call office for appt 0900 09/17/11. Protocol(s) Used: Cough - Adult Recommended Outcome per Protocol: See Provider within 24 hours Reason for Outcome: Productive cough with colored sputum (other than clear or white sputum) Care Advice: ~ Use a cool mist humidifier to moisten air. Be sure to clean according to manufacturer's instructions. Limit or avoid exposure to irritants and allergens (e.g. air pollution, smoke/smoking, chemicals, dust, pollen, pet dander, etc.) ~ Call provider if fever greater than 101.5 F (38.6 C) or 100.5 F (38.1C) in an immunocompromised patient (such as diabetes, HIV/AIDS, renal disease, chemotherapy, organ transplant, or chronic steroid use) has not improved in 24 hours. ~ Increase fluids to 8-12 eight oz (1.6 to 2.4 liters) glasses per day, half of them to be water. Soups, popsicles, fruit juices, non-caffeinated sodas (unless restricting sodium intake), jello, broths, decaf teas, etc. are all okay. Warm fluids can be soothing. ~ ~ If you can, stop smoking now and avoid all secondhand smoke. ~ HEALTH PROMOTION /  MAINTENANCE ~ SYMPTOM / CONDITION MANAGEMENT ~ CAUTIONS Coughing up mucus or phlegm helps to get rid of an infection. A productive cough should not be stopped. A cough medicine with guaifenesin (Robitussin, Mucinex) can help loosen the mucus. Cough medicine with dextromethorphan (DM) should be avoided. Drinking lots of fluids can help loosen the mucus too, especially warm fluids. ~ 09/17/2011 8:36:34AM Page 1 of 1 [id number]

## 2011-09-23 ENCOUNTER — Telehealth: Payer: Self-pay

## 2011-09-23 MED ORDER — HYDROCODONE-HOMATROPINE 5-1.5 MG/5ML PO SYRP: 5.0000 mL | ORAL_SOLUTION | Freq: Four times a day (QID) | ORAL | Status: AC | PRN

## 2011-09-23 NOTE — Telephone Encounter (Signed)
Pt informed, Rx faxed to pharmacy

## 2011-09-23 NOTE — Telephone Encounter (Signed)
Pt called c/o continued cough. Pt is concerned and is requesting advisement from MD. Pt has not tried tessalon during the day due to drowsiness but is willing try another medication, please advise.

## 2011-09-23 NOTE — Telephone Encounter (Signed)
Ok for cough med - Done hardcopy to dahlia/LIM B

## 2011-10-06 ENCOUNTER — Telehealth: Payer: Self-pay

## 2011-10-06 MED ORDER — ESCITALOPRAM OXALATE 10 MG PO TABS: 10.0000 mg | ORAL_TABLET | Freq: Every day | ORAL | Status: DC

## 2011-10-06 NOTE — Telephone Encounter (Signed)
Pt called stating that his BP has been elevated the last 2 times he has check it at his local pharmacy - 144/117  145/132. Pt stated that he also had palpitations and chest tightness at the time. Pt is concerned that this can be related to anxiety and possible panic attacks. He states that he been taking the Xanax only sparingly and it does seem to help with stress (has been experiencing increased stress since last OV). Pt is requesting advisement from MD

## 2011-10-06 NOTE — Telephone Encounter (Signed)
Last 4 BP on the chart from 2011 and 2012 have been normal  I suspect possibly the BP's at the pharmacy may be inaccurate; I would hold on medication for BP at this time  Stress can also make BP temporarily increase  OK for lexapro 10 mg per day if he is willing to try - done per emr

## 2011-10-07 NOTE — Telephone Encounter (Signed)
Patient informed. 

## 2011-10-14 ENCOUNTER — Encounter: Payer: Self-pay | Admitting: Internal Medicine

## 2011-11-09 ENCOUNTER — Telehealth: Payer: Self-pay

## 2011-11-09 MED ORDER — HYDROCORTISONE ACETATE 25 MG RE SUPP: 25.0000 mg | Freq: Two times a day (BID) | RECTAL | Status: AC

## 2011-11-09 NOTE — Telephone Encounter (Signed)
If no bleeding or fever, I can rx anusol HC supp for ? Anal tear - done per emr

## 2011-11-09 NOTE — Telephone Encounter (Signed)
Pt called stating he is experiencing rectal pain. Pt denied constipation, says he had a normal BM this morning. Pt is requesting MD's advisement on possible causes for his pain, please advise.

## 2011-11-09 NOTE — Telephone Encounter (Signed)
Patient informed. 

## 2011-12-23 ENCOUNTER — Encounter: Payer: Self-pay | Admitting: Internal Medicine

## 2011-12-23 ENCOUNTER — Other Ambulatory Visit (INDEPENDENT_AMBULATORY_CARE_PROVIDER_SITE_OTHER): Payer: BC Managed Care – PPO

## 2011-12-23 ENCOUNTER — Telehealth: Payer: Self-pay

## 2011-12-23 DIAGNOSIS — Z0000 Encounter for general adult medical examination without abnormal findings: Secondary | ICD-10-CM

## 2011-12-23 LAB — URINALYSIS, ROUTINE W REFLEX MICROSCOPIC
Bilirubin Urine: NEGATIVE
Hgb urine dipstick: NEGATIVE
Leukocytes, UA: NEGATIVE
Nitrite: NEGATIVE
pH: 7.5 (ref 5.0–8.0)

## 2011-12-23 LAB — CBC WITH DIFFERENTIAL/PLATELET
Basophils Relative: 0.9 % (ref 0.0–3.0)
Eosinophils Absolute: 0.1 10*3/uL (ref 0.0–0.7)
HCT: 45.1 % (ref 39.0–52.0)
Hemoglobin: 15.6 g/dL (ref 13.0–17.0)
Lymphocytes Relative: 22.4 % (ref 12.0–46.0)
Lymphs Abs: 1.1 10*3/uL (ref 0.7–4.0)
MCHC: 34.6 g/dL (ref 30.0–36.0)
MCV: 90.9 fl (ref 78.0–100.0)
Neutro Abs: 3.3 10*3/uL (ref 1.4–7.7)
RBC: 4.96 Mil/uL (ref 4.22–5.81)

## 2011-12-23 LAB — BASIC METABOLIC PANEL
BUN: 16 mg/dL (ref 6–23)
Calcium: 9.6 mg/dL (ref 8.4–10.5)
Chloride: 105 mEq/L (ref 96–112)
Creatinine, Ser: 1.1 mg/dL (ref 0.4–1.5)

## 2011-12-23 LAB — PSA: PSA: 0.48 ng/mL (ref 0.10–4.00)

## 2011-12-23 LAB — HEPATIC FUNCTION PANEL
ALT: 23 U/L (ref 0–53)
Total Bilirubin: 0.8 mg/dL (ref 0.3–1.2)
Total Protein: 6.5 g/dL (ref 6.0–8.3)

## 2011-12-23 LAB — LIPID PANEL: Total CHOL/HDL Ratio: 3

## 2011-12-23 LAB — TSH: TSH: 1.36 u[IU]/mL (ref 0.35–5.50)

## 2011-12-23 NOTE — Telephone Encounter (Signed)
Put order in for labs. 

## 2012-02-17 ENCOUNTER — Ambulatory Visit (INDEPENDENT_AMBULATORY_CARE_PROVIDER_SITE_OTHER): Payer: BC Managed Care – PPO | Admitting: Internal Medicine

## 2012-02-17 ENCOUNTER — Encounter: Payer: Self-pay | Admitting: Internal Medicine

## 2012-02-17 VITALS — BP 112/80 | HR 76 | Temp 97.0°F | Ht 68.0 in | Wt 172.1 lb

## 2012-02-17 DIAGNOSIS — F988 Other specified behavioral and emotional disorders with onset usually occurring in childhood and adolescence: Secondary | ICD-10-CM

## 2012-02-17 DIAGNOSIS — F411 Generalized anxiety disorder: Secondary | ICD-10-CM

## 2012-02-17 DIAGNOSIS — G4700 Insomnia, unspecified: Secondary | ICD-10-CM

## 2012-02-17 DIAGNOSIS — K219 Gastro-esophageal reflux disease without esophagitis: Secondary | ICD-10-CM

## 2012-02-17 MED ORDER — VILAZODONE HCL 40 MG PO TABS: 40.0000 mg | ORAL_TABLET | Freq: Every day | ORAL | Status: DC

## 2012-02-17 MED ORDER — CLONAZEPAM 0.5 MG PO TABS: ORAL_TABLET | ORAL | Status: DC

## 2012-02-17 NOTE — Patient Instructions (Addendum)
Take all new medications as prescribed Continue all other medications as before Please keep your appointments with your specialists as you have planned Please continue your efforts at being more active, low cholesterol diet, and weight control. Please have the pharmacy call with any refills you may need.

## 2012-02-18 ENCOUNTER — Encounter: Payer: Self-pay | Admitting: Internal Medicine

## 2012-02-18 NOTE — Assessment & Plan Note (Signed)
Not clear the viibryd is best choice for depression with his anxiety and is expensive, but overall no daytime panic and to cont meds as is, f/u with psychiatry

## 2012-02-18 NOTE — Progress Notes (Signed)
Subjective:    Patient ID: Nathaniel [name], male    DOB: [birth year], 46 y.o.   MRN: [id number]  HPI  Here to f/u;  Has been seeing psychiatry with worsening depression now improved on escalating viibryd but anxiety and sleep remains difficult, not so much getting to sleep as staying asleep, meds seem to tend to wear off about 3-4 am and cannot get back to sleep.  Has been on risperidone but did not work well, was suggested to change to seroquel but is expensive and does not like the idea of taking and atypical antipsychotic.  Even took [place] 10 and risperidone together without good improveement.  Does not have signficant panic during the day, does not want daytime benzo, but was suggested per psychiatry to try change of xanax to clonopin.  ADD overall stable with task completion, concentration.  Denies worsening reflux, dysphagia, abd pain, n/v, bowel change or blood.  Allergy symptoms overall controlled on current meds without worsening congestion, sneeze, itch. Past Medical History  Diagnosis Date  . ADD 05/03/2007  . ALLERGIC RHINITIS 11/25/2007  . ANXIETY 05/03/2007  . CHEST PAIN 07/28/2010  . 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 GERD 07/31/2010  . HYPERLIPIDEMIA 08/01/2007  . INSOMNIA 10/17/2007  . Memory loss 11/23/2007  . PSA, INCREASED 01/06/2010  . SLEEP APNEA, OBSTRUCTIVE, SEVERE 05/03/2007   Past Surgical History  Procedure Date  . Tonsillectomy     reports that he has never smoked. He does not have any smokeless tobacco history on file. He reports that he drinks alcohol. He reports that he does not use illicit drugs. family history includes Cancer in his maternal grandfather. Allergies  Allergen Reactions  . Amoxicillin-Pot Clavulanate     REACTION: nausea/GI upset   Current Outpatient Prescriptions on File Prior to Visit  Medication Sig Dispense Refill  . benzonatate (TESSALON PERLES) 100 MG capsule 1-2 tabs by mouth 3 times daily prn cough  90 capsule  0  . clonazePAM  (KLONOPIN) 0.5 MG tablet 1-2 tabs by mouth at night for sleep as needed  60 tablet  1  . escitalopram (LEXAPRO) 10 MG tablet Take 1 tablet (10 mg total) by mouth daily.  30 tablet  11  . fluticasone (FLONASE) 50 MCG/ACT nasal spray Place 2 sprays into the nose daily.  16 g  5  . Vilazodone HCl (VIIBRYD) 40 MG TABS Take 1 tablet (40 mg total) by mouth daily.  90 tablet  3   Review of Systems Review of Systems  Constitutional: Negative for diaphoresis and unexpected weight change.  HENT: Negative for drooling and tinnitus.   Eyes: Negative for photophobia and visual disturbance.  Respiratory: Negative for choking and stridor.    Musculoskeletal: Negative for gait problem.  Skin: Negative for color change and wound.  Neurological: Negative for tremors and numbness.  Psychiatric/Behavioral: Negative for decreased concentration. The patient is not hyperactive.     Objective:   Physical Exam BP 112/80  Pulse 76  Temp(Src) 97 F (36.1 C) (Oral)  Ht 5\' 8"  (1.727 m)  Wt 172 lb 2 oz (78.075 kg)  BMI 26.17 kg/m2  SpO2 97% Physical Exam  VS noted Constitutional: Pt appears well-developed and well-nourished.  HENT: Head: Normocephalic.  Right Ear: External ear normal.  Left Ear: External ear normal.  Eyes: Conjunctivae and EOM are normal. Pupils are equal, round, and reactive to light.  Neck: Normal range of motion. Neck supple.  Cardiovascular: Normal rate and regular rhythm.   Pulmonary/Chest: Effort normal  and breath sounds normal.  Neurological: Pt is alert. Not confused, motor/gait intact Skin: Skin is warm. No erythema. No rash Psychiatric: Pt behavior is normal. Thought content normal. 1+ nervous    Assessment & Plan:

## 2012-02-18 NOTE — Assessment & Plan Note (Signed)
stable overall by hx and exam, , and pt to continue medical treatment as before - declines need for further tx at this time

## 2012-02-18 NOTE — Assessment & Plan Note (Signed)
stable overall by hx and exam, , and pt to continue medical treatment as before   

## 2012-02-18 NOTE — Assessment & Plan Note (Signed)
Ok to change xanax to klonopin asd,   to f/u any worsening symptoms or concerns

## 2012-04-16 ENCOUNTER — Telehealth: Payer: Self-pay

## 2012-04-16 MED ORDER — SILDENAFIL CITRATE 100 MG PO TABS: 50.0000 mg | ORAL_TABLET | Freq: Every day | ORAL | Status: DC | PRN

## 2012-04-16 NOTE — Telephone Encounter (Signed)
Done erx 

## 2012-04-16 NOTE — Telephone Encounter (Signed)
Pt called requesting Rx for Viagra, please advise.  

## 2012-04-17 NOTE — Telephone Encounter (Signed)
Called the patient left message that prescription requested has been filled

## 2012-04-26 ENCOUNTER — Telehealth: Payer: Self-pay

## 2012-04-26 NOTE — Telephone Encounter (Signed)
viagra

## 2012-04-27 NOTE — Telephone Encounter (Signed)
A user error has taken place: encounter opened in error, closed for administrative reasons.

## 2012-05-11 ENCOUNTER — Other Ambulatory Visit: Payer: Self-pay | Admitting: Internal Medicine

## 2012-06-12 ENCOUNTER — Other Ambulatory Visit: Payer: Self-pay | Admitting: Internal Medicine

## 2012-06-12 NOTE — Telephone Encounter (Signed)
Done hardcopy to robin  

## 2012-06-12 NOTE — Telephone Encounter (Signed)
Faxed hardcopy to pharmacy. 

## 2012-09-13 ENCOUNTER — Telehealth: Payer: Self-pay

## 2012-09-13 ENCOUNTER — Ambulatory Visit (INDEPENDENT_AMBULATORY_CARE_PROVIDER_SITE_OTHER): Payer: BC Managed Care – PPO | Admitting: Internal Medicine

## 2012-09-13 ENCOUNTER — Encounter: Payer: Self-pay | Admitting: Internal Medicine

## 2012-09-13 VITALS — BP 100/78 | HR 76 | Temp 98.0°F | Ht 68.0 in | Wt 189.5 lb

## 2012-09-13 DIAGNOSIS — Z0000 Encounter for general adult medical examination without abnormal findings: Secondary | ICD-10-CM

## 2012-09-13 DIAGNOSIS — Z23 Encounter for immunization: Secondary | ICD-10-CM

## 2012-09-13 DIAGNOSIS — F411 Generalized anxiety disorder: Secondary | ICD-10-CM

## 2012-09-13 MED ORDER — VILAZODONE HCL 40 MG PO TABS: 20.0000 mg | ORAL_TABLET | Freq: Every day | ORAL | Status: DC

## 2012-09-13 MED ORDER — CELECOXIB 400 MG PO CAPS: 200.0000 mg | ORAL_CAPSULE | Freq: Two times a day (BID) | ORAL | Status: DC | PRN

## 2012-09-13 NOTE — Telephone Encounter (Signed)
The pharmacy called requesting clarification on Celebrex 400 mg sent in today.  Please advise if ok to fill as pharmacist stated is a high dose.

## 2012-09-13 NOTE — Assessment & Plan Note (Signed)
To cont med, f/u psychiatry as planned

## 2012-09-13 NOTE — Telephone Encounter (Signed)
rx should have been for 200 bid prn, ok for this for this pt

## 2012-09-13 NOTE — Progress Notes (Signed)
Subjective:    Patient ID: Nathaniel [name], male    DOB: May 09, 1966, 46 y.o.   MRN: [id number]  HPI  Here for wellness and f/u;  Overall doing ok;  Pt denies CP, worsening SOB, DOE, wheezing, orthopnea, PND, worsening LE edema, palpitations, dizziness or syncope.  Pt denies neurological change such as new Headache, facial or extremity weakness.  Pt denies polydipsia, polyuria, or low sugar symptoms. Pt states overall good compliance with treatment and medications, good tolerability, and trying to follow lower cholesterol diet.  Pt denies worsening depressive symptoms, suicidal ideation or panic. No fever, wt loss, night sweats, loss of appetite, or other constitutional symptoms.  Pt states good ability with ADL's, low fall risk, home safety reviewed and adequate, no significant changes in hearing or vision, and occasionally active with exercise.  Vibrryd seems to be helping, but has been less active and gained several lbs. No elevated BP at home. Only takine half vibryd for now, as appetitie seems to have increased, seems to work ok.  Only taking half the klonopin at bedtime some nights. Has been seeing Dr Ann Maki McKinney/psychiatry, due soon for f/u.   Past Medical History  Diagnosis Date  . ADD 05/03/2007  . ALLERGIC RHINITIS 11/25/2007  . ANXIETY 05/03/2007  . CHEST PAIN 07/28/2010  . 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 GERD 07/31/2010  . HYPERLIPIDEMIA 08/01/2007  . INSOMNIA 10/17/2007  . Memory loss 11/23/2007  . PSA, INCREASED 01/06/2010  . SLEEP APNEA, OBSTRUCTIVE, SEVERE 05/03/2007   Past Surgical History  Procedure Date  . Tonsillectomy     reports that he has never smoked. He does not have any smokeless tobacco history on file. He reports that he drinks alcohol. He reports that he does not use illicit drugs. family history includes Cancer in his maternal grandfather. Allergies  Allergen Reactions  . Amoxicillin-Pot Clavulanate     REACTION: nausea/GI upset   Current  Outpatient Prescriptions on File Prior to Visit  Medication Sig Dispense Refill  . clonazePAM (KLONOPIN) 0.5 MG tablet take 1-2 tablets by mouth at bedtime for sleep  60 tablet  2  . Vilazodone HCl (VIIBRYD) 40 MG TABS Take 1 tablet (40 mg total) by mouth daily.  90 tablet  3  . benzonatate (TESSALON PERLES) 100 MG capsule 1-2 tabs by mouth 3 times daily prn cough  90 capsule  0  . escitalopram (LEXAPRO) 10 MG tablet Take 1 tablet (10 mg total) by mouth daily.  30 tablet  11  . fluticasone (FLONASE) 50 MCG/ACT nasal spray instill 2 sprays into each nostril once daily  16 g  5  . sildenafil (VIAGRA) 100 MG tablet Take 0.5-1 tablets (50-100 mg total) by mouth daily as needed for erectile dysfunction.  5 tablet  11   Review of Systems Review of Systems  Constitutional: Negative for diaphoresis, activity change, appetite change and unexpected weight change.  HENT: Negative for hearing loss, ear pain, facial swelling, mouth sores and neck stiffness.   Eyes: Negative for pain, redness and visual disturbance.  Respiratory: Negative for shortness of breath and wheezing.   Cardiovascular: Negative for chest pain and palpitations.  Gastrointestinal: Negative for diarrhea, blood in stool, abdominal distention and rectal pain.  Genitourinary: Negative for hematuria, flank pain and decreased urine volume.  Musculoskeletal: Negative for myalgias and joint swelling.  Skin: Negative for color change and wound.  Neurological: Negative for syncope and numbness.  Hematological: Negative for adenopathy.  Psychiatric/Behavioral: Negative for hallucinations, self-injury, decreased concentration  and agitation.      Objective:   Physical Exam BP 100/78  Pulse 76  Temp 98 F (36.7 C) (Oral)  Ht 5\' 8"  (1.727 m)  Wt 189 lb 8 oz (85.957 kg)  BMI 28.81 kg/m2  SpO2 93% Physical Exam  VS noted Constitutional: Pt is oriented to person, place, and time. Appears well-developed and well-nourished.  HENT:  Head:  Normocephalic and atraumatic.  Right Ear: External ear normal.  Left Ear: External ear normal.  Nose: Nose normal.  Mouth/Throat: Oropharynx is clear and moist.  Eyes: Conjunctivae and EOM are normal. Pupils are equal, round, and reactive to light.  Neck: Normal range of motion. Neck supple. No JVD present. No tracheal deviation present.  Cardiovascular: Normal rate, regular rhythm, normal heart sounds and intact distal pulses.   Pulmonary/Chest: Effort normal and breath sounds normal.  Abdominal: Soft. Bowel sounds are normal. There is no tenderness.  Musculoskeletal: Normal range of motion. Exhibits no edema.  Lymphadenopathy:  Has no cervical adenopathy.  Neurological: Pt is alert and oriented to person, place, and time. Pt has normal reflexes. No cranial nerve deficit.  Skin: Skin is warm and dry. No rash noted.  Psychiatric:  Has  normal mood and affect. Behavior is normal. Not nervous or depressed affect    Assessment & Plan:

## 2012-09-13 NOTE — Patient Instructions (Addendum)
You had the flu shot today Continue all other medications as before Please have the pharmacy call with any other refills you may need Please continue your efforts at being more active, low cholesterol diet, and weight control. You are otherwise up to date with prevention Thank you for enrolling in MyChart. Please follow the instructions below to securely access your online medical record. MyChart allows you to send messages to your doctor, view your test results, renew your prescriptions, schedule appointments, and more. To Log into MyChart, please go to [email or web address], and your Username is: [email or web address] Please return in 1 year for your yearly visit, or sooner if needed, with Lab testing done 3-5 days before

## 2012-09-13 NOTE — Assessment & Plan Note (Signed)

## 2012-09-14 NOTE — Telephone Encounter (Signed)
Pharmacist informed and corrected med. List.

## 2012-10-05 ENCOUNTER — Telehealth: Payer: Self-pay

## 2012-10-05 NOTE — Telephone Encounter (Signed)
Received PA Approval for Celebrex from 09/14/12 through 10/05/2013 ZO#[id number].

## 2013-02-28 ENCOUNTER — Other Ambulatory Visit: Payer: Self-pay

## 2013-02-28 ENCOUNTER — Other Ambulatory Visit: Payer: Self-pay | Admitting: Internal Medicine

## 2013-02-28 MED ORDER — FLUTICASONE PROPIONATE 50 MCG/ACT NA SUSP: 2.0000 | Freq: Every day | NASAL | Status: DC

## 2013-03-01 ENCOUNTER — Telehealth: Payer: Self-pay

## 2013-03-01 NOTE — Telephone Encounter (Signed)
Called left message to call back 

## 2013-03-01 NOTE — Telephone Encounter (Signed)
Sorry, there is none less expensive, but he could try a lower quantity such as 5 instead of the 10 like a whole prescription

## 2013-03-01 NOTE — Telephone Encounter (Signed)
The patient would like a refill on viagra, but cost was going to be $180 please advise on alternative  Cheaper medication.  Pharmacy is [facility]

## 2013-03-01 NOTE — Telephone Encounter (Signed)
Patient calls requesting samples of viagra. I called him back and left a message that we do not have any samples.

## 2013-03-04 NOTE — Telephone Encounter (Signed)
Called the patient left message to call back 

## 2013-05-22 ENCOUNTER — Telehealth: Payer: Self-pay | Admitting: Neurology

## 2013-05-24 NOTE — Telephone Encounter (Signed)
I called pt and he would like to see if Dr. Vickey Huger will write a letter relating to the last time he was in the office.  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  He states he was going thru some rough times back when seen and due to his condition (trouble sleeping, apnea, insomnia) that this caused him to forget about filing his taxes.  The note can be a brief paragraph noting his condition at that time.  Please advise  Dr. Vickey Huger if will do.  Thanks [phone]

## 2013-06-06 ENCOUNTER — Encounter: Payer: Self-pay | Admitting: Neurology

## 2013-06-06 NOTE — Telephone Encounter (Signed)
Dr. Vickey Huger has agreed to the letter.  Will notify patient when complete.

## 2013-08-08 ENCOUNTER — Other Ambulatory Visit: Payer: Self-pay

## 2013-12-09 ENCOUNTER — Other Ambulatory Visit: Payer: Self-pay | Admitting: Internal Medicine

## 2013-12-16 ENCOUNTER — Other Ambulatory Visit: Payer: Self-pay | Admitting: Internal Medicine

## 2013-12-16 ENCOUNTER — Other Ambulatory Visit: Payer: Self-pay | Admitting: *Deleted

## 2013-12-20 ENCOUNTER — Encounter: Payer: Self-pay | Admitting: Internal Medicine

## 2013-12-20 ENCOUNTER — Other Ambulatory Visit (INDEPENDENT_AMBULATORY_CARE_PROVIDER_SITE_OTHER): Payer: BC Managed Care – PPO

## 2013-12-20 ENCOUNTER — Ambulatory Visit (INDEPENDENT_AMBULATORY_CARE_PROVIDER_SITE_OTHER): Payer: BC Managed Care – PPO | Admitting: Internal Medicine

## 2013-12-20 VITALS — BP 140/100 | HR 93 | Temp 98.1°F | Ht 68.0 in | Wt 196.5 lb

## 2013-12-20 DIAGNOSIS — Z0000 Encounter for general adult medical examination without abnormal findings: Secondary | ICD-10-CM

## 2013-12-20 DIAGNOSIS — R030 Elevated blood-pressure reading, without diagnosis of hypertension: Secondary | ICD-10-CM

## 2013-12-20 DIAGNOSIS — J019 Acute sinusitis, unspecified: Secondary | ICD-10-CM

## 2013-12-20 LAB — URINALYSIS, ROUTINE W REFLEX MICROSCOPIC
BILIRUBIN URINE: NEGATIVE
HGB URINE DIPSTICK: NEGATIVE
KETONES UR: NEGATIVE
LEUKOCYTES UA: NEGATIVE
Nitrite: NEGATIVE
RBC / HPF: NONE SEEN (ref 0–?)
Specific Gravity, Urine: 1.01 (ref 1.000–1.030)
Total Protein, Urine: NEGATIVE
URINE GLUCOSE: NEGATIVE
UROBILINOGEN UA: 1 (ref 0.0–1.0)
pH: 7 (ref 5.0–8.0)

## 2013-12-20 LAB — CBC WITH DIFFERENTIAL/PLATELET
BASOS ABS: 0 10*3/uL (ref 0.0–0.1)
Basophils Relative: 0.5 % (ref 0.0–3.0)
EOS PCT: 2.3 % (ref 0.0–5.0)
Eosinophils Absolute: 0.2 10*3/uL (ref 0.0–0.7)
HEMATOCRIT: 46.8 % (ref 39.0–52.0)
HEMOGLOBIN: 16.2 g/dL (ref 13.0–17.0)
LYMPHS PCT: 19.7 % (ref 12.0–46.0)
Lymphs Abs: 1.6 10*3/uL (ref 0.7–4.0)
MCHC: 34.7 g/dL (ref 30.0–36.0)
MCV: 88.2 fl (ref 78.0–100.0)
MONOS PCT: 14.4 % — AB (ref 3.0–12.0)
Monocytes Absolute: 1.1 10*3/uL — ABNORMAL HIGH (ref 0.1–1.0)
Neutro Abs: 5 10*3/uL (ref 1.4–7.7)
Neutrophils Relative %: 63.1 % (ref 43.0–77.0)
PLATELETS: 234 10*3/uL (ref 150.0–400.0)
RBC: 5.3 Mil/uL (ref 4.22–5.81)
RDW: 12.9 % (ref 11.5–14.6)
WBC: 7.9 10*3/uL (ref 4.5–10.5)

## 2013-12-20 LAB — BASIC METABOLIC PANEL
BUN: 11 mg/dL (ref 6–23)
CHLORIDE: 101 meq/L (ref 96–112)
CO2: 29 mEq/L (ref 19–32)
CREATININE: 1 mg/dL (ref 0.4–1.5)
Calcium: 9.5 mg/dL (ref 8.4–10.5)
GFR: 88.13 mL/min (ref >60.00)
Glucose, Bld: 88 mg/dL (ref 70–99)
Potassium: 4.3 mEq/L (ref 3.5–5.1)
Sodium: 138 mEq/L (ref 135–145)

## 2013-12-20 LAB — LIPID PANEL
CHOL/HDL RATIO: 4
Cholesterol: 160 mg/dL (ref 0–200)
HDL: 36 mg/dL — ABNORMAL LOW (ref >39.00)
LDL CALC: 72 mg/dL (ref 0–99)
Triglycerides: 262 mg/dL — ABNORMAL HIGH (ref 0.0–149.0)
VLDL: 52.4 mg/dL — ABNORMAL HIGH (ref 0.0–40.0)

## 2013-12-20 LAB — HEPATIC FUNCTION PANEL
ALT: 46 U/L (ref 0–53)
AST: 29 U/L (ref 0–37)
Albumin: 4.3 g/dL (ref 3.5–5.2)
Alkaline Phosphatase: 80 U/L (ref 39–117)
BILIRUBIN DIRECT: 0.1 mg/dL (ref 0.0–0.3)
BILIRUBIN TOTAL: 0.8 mg/dL (ref 0.3–1.2)
Total Protein: 6.7 g/dL (ref 6.0–8.3)

## 2013-12-20 LAB — PSA: PSA: 0.4 ng/mL (ref 0.10–4.00)

## 2013-12-20 LAB — TSH: TSH: 2.61 u[IU]/mL (ref 0.35–5.50)

## 2013-12-20 MED ORDER — LEVOFLOXACIN 500 MG PO TABS: 500.0000 mg | ORAL_TABLET | Freq: Every day | ORAL | Status: DC

## 2013-12-20 MED ORDER — FLUTICASONE PROPIONATE 50 MCG/ACT NA SUSP: 2.0000 | Freq: Every day | NASAL | Status: DC

## 2013-12-20 NOTE — Progress Notes (Signed)
Subjective:    Patient ID: Nathaniel [name], male    DOB: 05/29/1966, 48 y.o.   MRN: [id number]  HPI  Here for wellness and f/u;  Overall doing ok;  Pt denies CP, worsening SOB, DOE, wheezing, orthopnea, PND, worsening LE edema, palpitations, dizziness or syncope.  Pt denies neurological change such as new headache, facial or extremity weakness.  Pt denies polydipsia, polyuria, or low sugar symptoms. Pt states overall good compliance with treatment and medications, good tolerability, and has been trying to follow lower cholesterol diet.  Pt denies worsening depressive symptoms, suicidal ideation or panic. No fever, night sweats, wt loss, loss of appetite, or other constitutional symptoms.  Pt states good ability with ADL's, has low fall risk, home safety reviewed and adequate, no other significant changes in hearing or vision, and only occasionally active with exercise, though has gained a few lbs with better appetiete after depression resolved. Incidentally with 2-3 days acute onset fever, facial pain, pressure, headache, general weakness and malaise, and greenish d/c, with mild ST and cough,  BP at home < 140/90 Past Medical History  Diagnosis Date  . ADD 05/03/2007  . ALLERGIC RHINITIS 11/25/2007  . ANXIETY 05/03/2007  . CHEST PAIN 07/28/2010  . 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 GERD 07/31/2010  . HYPERLIPIDEMIA 08/01/2007  . INSOMNIA 10/17/2007  . Memory loss 11/23/2007  . PSA, INCREASED 01/06/2010  . SLEEP APNEA, OBSTRUCTIVE, SEVERE 05/03/2007   Past Surgical History  Procedure Laterality Date  . Tonsillectomy      reports that he has never smoked. He does not have any smokeless tobacco history on file. He reports that he drinks alcohol. He reports that he does not use illicit drugs. family history includes Cancer in his maternal grandfather. Allergies  Allergen Reactions  . Amoxicillin-Pot Clavulanate     REACTION: nausea/GI upset   Current Outpatient Prescriptions on File  Prior to Visit  Medication Sig Dispense Refill  . celecoxib (CELEBREX) 200 MG capsule Take 200 mg by mouth 2 (two) times daily.      . clonazePAM (KLONOPIN) 0.5 MG tablet take 1-2 tablets by mouth at bedtime for sleep  60 tablet  2  . sildenafil (VIAGRA) 100 MG tablet Take 0.5-1 tablets (50-100 mg total) by mouth daily as needed for erectile dysfunction.  5 tablet  11   No current facility-administered medications on file prior to visit.   Review of Systems  Constitutional: Negative for diaphoresis, activity change, appetite change or unexpected weight change.  HENT: Negative for hearing loss, ear pain, facial swelling, mouth sores and neck stiffness.   Eyes: Negative for pain, redness and visual disturbance.  Respiratory: Negative for shortness of breath and wheezing.   Cardiovascular: Negative for chest pain and palpitations.  Gastrointestinal: Negative for diarrhea, blood in stool, abdominal distention or other pain Genitourinary: Negative for hematuria, flank pain or change in urine volume.  Musculoskeletal: Negative for myalgias and joint swelling.  Skin: Negative for color change and wound.  Neurological: Negative for syncope and numbness. other than noted Hematological: Negative for adenopathy.  Psychiatric/Behavioral: Negative for hallucinations, self-injury, decreased concentration and agitation.         Objective:   Physical Exam BP 140/100  Pulse 93  Temp(Src) 98.1 F (36.7 C) (Oral)  Ht 5\' 8"  (1.727 m)  Wt 196 lb 8 oz (89.132 kg)  BMI 29.88 kg/m2  SpO2 96% VS noted, mild ill  Constitutional: Pt is oriented to person, place, and time. Appears well-developed and  well-nourished.  Head: Normocephalic and atraumatic.  Right Ear: External ear normal.  Left Ear: External ear normal.  Nose: Nose normal.  Mouth/Throat: Oropharynx is clear and moist.  Bilat tm's with mild erythema.  Max sinus areas mild tender.  Pharynx with mild erythema, no exudate Eyes: Conjunctivae and  EOM are normal. Pupils are equal, round, and reactive to light.  Neck: Normal range of motion. Neck supple. No JVD present. No tracheal deviation present.  Cardiovascular: Normal rate, regular rhythm, normal heart sounds and intact distal pulses.   Pulmonary/Chest: Effort normal and breath sounds normal.  Abdominal: Soft. Bowel sounds are normal. There is no tenderness. No HSM  Musculoskeletal: Normal range of motion. Exhibits no edema.  Lymphadenopathy:  Has no cervical adenopathy.  Neurological: Pt is alert and oriented to person, place, and time. Pt has normal reflexes. No cranial nerve deficit.  Skin: Skin is warm and dry. No rash noted.  Psychiatric:  Has  normal mood and affect. Behavior is normal.     Assessment & Plan:

## 2013-12-20 NOTE — Progress Notes (Signed)
Pre visit review using our clinic review tool, if applicable. No additional management support is needed unless otherwise documented below in the visit note. 

## 2013-12-20 NOTE — Patient Instructions (Signed)
Please take all new medication as prescribed - the antibiotic  Please continue all other medications as before, and refills have been done if requested. - the flonase Please have the pharmacy call with any other refills you may need.  You will be contacted regarding the referral for: colonoscopy  Please continue your efforts at being more active, low cholesterol diet, and weight control. You are otherwise up to date with prevention measures today.  Please go to the LAB in the Basement (turn left off the elevator) for the tests to be done today You will be contacted by phone if any changes need to be made immediately.  Otherwise, you will receive a letter about your results with an explanation, but please check with MyChart first.  Please return in 1 year for your yearly visit, or sooner if needed, with Lab testing done 3-5 days before  OK to cancel the appt in June 2015

## 2013-12-21 DIAGNOSIS — J019 Acute sinusitis, unspecified: Secondary | ICD-10-CM | POA: Insufficient documentation

## 2013-12-21 DIAGNOSIS — R030 Elevated blood-pressure reading, without diagnosis of hypertension: Secondary | ICD-10-CM | POA: Insufficient documentation

## 2013-12-21 NOTE — Assessment & Plan Note (Signed)
Mild to mod, for antibx course,  to f/u any worsening symptoms or concerns 

## 2013-12-21 NOTE — Assessment & Plan Note (Signed)

## 2013-12-21 NOTE — Assessment & Plan Note (Signed)
?   Reactive due to illness, to cont to monitor for persistent elev > 140/90

## 2014-01-23 ENCOUNTER — Encounter: Payer: Self-pay | Admitting: Internal Medicine

## 2014-03-11 ENCOUNTER — Encounter: Payer: BC Managed Care – PPO | Admitting: Internal Medicine

## 2014-04-09 ENCOUNTER — Encounter: Payer: Self-pay | Admitting: Internal Medicine

## 2014-06-10 ENCOUNTER — Telehealth: Payer: Self-pay

## 2014-06-10 ENCOUNTER — Ambulatory Visit (AMBULATORY_SURGERY_CENTER): Payer: Self-pay | Admitting: *Deleted

## 2014-06-10 ENCOUNTER — Telehealth: Payer: Self-pay | Admitting: Internal Medicine

## 2014-06-10 VITALS — Ht 68.0 in | Wt 193.2 lb

## 2014-06-10 DIAGNOSIS — Z1211 Encounter for screening for malignant neoplasm of colon: Secondary | ICD-10-CM

## 2014-06-10 MED ORDER — MOVIPREP 100 G PO SOLR: 1.0000 | Freq: Once | ORAL | Status: DC

## 2014-06-10 NOTE — Telephone Encounter (Signed)
The patients dentist is needing to make a new oral appliance for the patients Sleep Apnea.  His present one he has had for 5 years.  The dentist needs PCP's ok.  Call back number to Dr. Toy Cookey or her assistant Darrick Penna is [phone].

## 2014-06-10 NOTE — Telephone Encounter (Signed)
Called the patient left a detailed message of results and mailed a copy of results to his home.

## 2014-06-10 NOTE — Telephone Encounter (Signed)
Called Dr. Corky Sing office left detailed message of verbal ok.

## 2014-06-10 NOTE — Telephone Encounter (Signed)
Pt came into office this morning wanting results from labs drawn December 20, 2013.  He stated he never heard anything regarding the results.  Please advise.

## 2014-06-10 NOTE — Progress Notes (Signed)
No egg or soy allergy. ewm Has cpap, cannot use, no home 02 use. ewm No issues with past sedation. ewm emmi video to pt's e mail. ewm

## 2014-06-10 NOTE — Telephone Encounter (Signed)
[place] for verbal if that is what is needed, thanks

## 2014-06-20 ENCOUNTER — Encounter: Payer: Self-pay | Admitting: Internal Medicine

## 2014-06-24 ENCOUNTER — Encounter: Payer: Self-pay | Admitting: Internal Medicine

## 2014-06-24 ENCOUNTER — Ambulatory Visit (AMBULATORY_SURGERY_CENTER): Payer: BC Managed Care – PPO | Admitting: Internal Medicine

## 2014-06-24 VITALS — BP 137/88 | HR 68 | Temp 97.4°F | Resp 15 | Ht 68.0 in | Wt 193.0 lb

## 2014-06-24 DIAGNOSIS — Z1211 Encounter for screening for malignant neoplasm of colon: Secondary | ICD-10-CM

## 2014-06-24 MED ORDER — SODIUM CHLORIDE 0.9 % IV SOLN: 500.0000 mL | INTRAVENOUS | Status: DC

## 2014-06-24 NOTE — Patient Instructions (Signed)
YOU HAD AN ENDOSCOPIC PROCEDURE TODAY AT THE [facility]: Refer to the procedure report that was given to you for any specific questions about what was found during the examination.  If the procedure report does not answer your questions, please call your gastroenterologist to clarify.  If you requested that your care partner not be given the details of your procedure findings, then the procedure report has been included in a sealed envelope for you to review at your convenience later.  YOU SHOULD EXPECT: Some feelings of bloating in the abdomen. Passage of more gas than usual.  Walking can help get rid of the air that was put into your GI tract during the procedure and reduce the bloating. If you had a lower endoscopy (such as a colonoscopy or flexible sigmoidoscopy) you may notice spotting of blood in your stool or on the toilet paper. If you underwent a bowel prep for your procedure, then you may not have a normal bowel movement for a few days.  DIET: Your first meal following the procedure should be a light meal and then it is ok to progress to your normal diet.  A half-sandwich or bowl of soup is an example of a good first meal.  Heavy or fried foods are harder to digest and may make you feel nauseous or bloated.  Likewise meals heavy in dairy and vegetables can cause extra gas to form and this can also increase the bloating.  Drink plenty of fluids but you should avoid alcoholic beverages for 24 hours.  ACTIVITY: Your care partner should take you home directly after the procedure.  You should plan to take it easy, moving slowly for the rest of the day.  You can resume normal activity the day after the procedure however you should NOT DRIVE or use heavy machinery for 24 hours (because of the sedation medicines used during the test).    SYMPTOMS TO REPORT IMMEDIATELY: A gastroenterologist can be reached at any hour.  During normal business hours, 8:30 AM to 5:00 PM Monday through Friday,  call [phone].  After hours and on weekends, please call the GI answering service at [phone] who will take a message and have the physician on call contact you.   Following lower endoscopy (colonoscopy or flexible sigmoidoscopy):  Excessive amounts of blood in the stool  Significant tenderness or worsening of abdominal pains  Swelling of the abdomen that is new, acute  Fever of 100F or higher  FOLLOW UP: If any biopsies were taken you will be contacted by phone or by letter within the next 1-3 weeks.  Call your gastroenterologist if you have not heard about the biopsies in 3 weeks.  Our staff will call the home number listed on your records the next business day following your procedure to check on you and address any questions or concerns that you may have at that time regarding the information given to you following your procedure. This is a courtesy call and so if there is no answer at the home number and we have not heard from you through the emergency physician on call, we will assume that you have returned to your regular daily activities without incident.  SIGNATURES/CONFIDENTIALITY: You and/or your care partner have signed paperwork which will be entered into your electronic medical record.  These signatures attest to the fact that that the information above on your After Visit Summary has been reviewed and is understood.  Full responsibility of the confidentiality of this   discharge information lies with you and/or your care-partner.  Recommendations Next colonoscopy in 10 years.

## 2014-06-24 NOTE — Op Note (Signed)
[facility] [place]  [address] [place], [id number]   COLONOSCOPY PROCEDURE REPORT  PATIENT: Nathaniel [name], Nathaniel [name]  MR#: [id number] BIRTHDATE: Jul 09, 1966 , 48  yrs. old GENDER: male ENDOSCOPIST: Eustace Quail, MD REFERRED [id number] Damarko, M.D. PROCEDURE DATE:  06/24/2014 PROCEDURE:   Colonoscopy, screening First Screening Colonoscopy - Avg.  risk and is 50 yrs.  old or older Yes.  Prior Negative Screening - Now for repeat screening. N/A  History of Adenoma - Now for follow-up colonoscopy & has been > or = to 3 yrs.  N/A  Polyps Removed Today? No.  Recommend repeat exam, <10 yrs? No. ASA CLASS:   Class I INDICATIONS:average risk for colorectal cancer. MEDICATIONS: Propofol 350 mg  DESCRIPTION OF PROCEDURE:   After the risks benefits and alternatives of the procedure were thoroughly explained, informed consent was obtained. Digital exam  revealed no abnormalities of the rectum.   The LB [id number]  endoscope was introduced through the anus and advanced to the cecum, which was identified by both the appendix and ileocecal valve. No adverse events experienced.   The quality of the prep was good, using MoviPrep The instrument was then slowly withdrawn as the colon was fully examined.  COLON FINDINGS: The examined terminal ileum appeared to be normal. There was mild diverticulosis noted in the left colon.   The colon mucosa was otherwise normal.  No polyps, cancers or masses were seen.  Retroflexed views revealed no abnormalities. The time to cecum=3 minutes 15 seconds.  Withdrawal time=16 minutes 32 seconds. The scope was withdrawn and the procedure completed. COMPLICATIONS: There were no complications.  ENDOSCOPIC IMPRESSION: 1.   The examined terminal ileum appeared to be normal 2.   Mild diverticulosis was noted in the left colon 3.   The colon mucosa was otherwise normal  RECOMMENDATIONS: 1. Continue current colorectal screening recommendations for "routine  risk" patients with a repeat colonoscopy in 10 years.  eSigned:  Eustace Quail, MD 06/24/2014 10:50 AM   cc: Biagio Borg, MD and The Patient

## 2014-06-25 ENCOUNTER — Telehealth: Payer: Self-pay | Admitting: *Deleted

## 2014-06-25 NOTE — Telephone Encounter (Signed)
  Follow up Call-  Call back number 06/24/2014  Post procedure Call Back phone  # [phone]  Permission to leave phone message Yes     Patient questions:  Do you have a fever, pain , or abdominal swelling? No. Pain Score  0 *  Have you tolerated food without any problems? Yes.    Have you been able to return to your normal activities? Yes.    Do you have any questions about your discharge instructions: Diet   No. Medications  No. Follow up visit  No.  Do you have questions or concerns about your Care? No.  Actions: * If pain score is 4 or above: No action needed, pain <4.

## 2014-08-08 ENCOUNTER — Telehealth: Payer: Self-pay | Admitting: Internal Medicine

## 2014-08-08 NOTE — Telephone Encounter (Signed)
Patient Information:  Caller Name: April  Phone: [phone]  Patient: Nathaniel [name], Nathaniel [name]  Gender: Male  DOB: 06/05/1966  Age: 48 Years  PCP: [name] (Adults only)  Office Follow Up:  Does the office need to follow up with this patient?: No  Instructions For The Office: N/A  RN Note:  Pt reports that he has chest congestion, mild ear congestion, coughing, afebrile.  He states that he is able to eat well and sleep.  The cough is productive.  He currently has an appt on 11/10 for OV.  No diff breathing.  No face or sinus pain.  Pt agrees to be seen on Tues or earlier if needed.  Home care advice and call back information given.  Symptoms  Reason For Call & Symptoms: URI  Reviewed Health History In EMR: Yes  Reviewed Medications In EMR: Yes  Reviewed Allergies In EMR: Yes  Reviewed Surgeries / Procedures: Yes  Date of Onset of Symptoms: 07/25/2014  Guideline(s) Used:  Colds  Cough  Disposition Per Guideline:   See Within 3 Days in Office  Reason For Disposition Reached:   Cough has been present for > 10 days  Advice Given:  Reassurance  Coughing is the way that our lungs remove irritants and mucus. It helps protect our lungs from getting pneumonia.  You can get a dry hacking cough after a chest cold. Sometimes this type of cough can last 1-3 weeks, and be worse at night.  OTC Cough Syrup - Dextromethorphan:  Cough syrups containing the cough suppressant dextromethorphan (DM) may help decrease your cough. Cough syrups work best for coughs that keep you awake at night. They can also sometimes help in the late stages of a respiratory infection when the cough is dry and hacking. They can be used along with cough drops.  Coughing Spasms:  Drink warm fluids. Inhale warm mist (Reason: both relax the airway and loosen up the phlegm).  Prevent Dehydration:  Drink adequate liquids.  Call Back If:  Difficulty breathing  Cough lasts more than 3 weeks  Fever lasts > 3 days  You become  worse.  Patient Will Follow Care Advice:  YES

## 2014-08-12 ENCOUNTER — Encounter: Payer: Self-pay | Admitting: Internal Medicine

## 2014-08-12 ENCOUNTER — Ambulatory Visit (INDEPENDENT_AMBULATORY_CARE_PROVIDER_SITE_OTHER): Payer: BC Managed Care – PPO | Admitting: Internal Medicine

## 2014-08-12 VITALS — BP 112/72 | HR 91 | Temp 98.1°F | Wt 194.1 lb

## 2014-08-12 DIAGNOSIS — J209 Acute bronchitis, unspecified: Secondary | ICD-10-CM | POA: Insufficient documentation

## 2014-08-12 MED ORDER — HYDROCODONE-HOMATROPINE 5-1.5 MG/5ML PO SYRP: 5.0000 mL | ORAL_SOLUTION | Freq: Four times a day (QID) | ORAL | Status: DC | PRN

## 2014-08-12 MED ORDER — LEVOFLOXACIN 500 MG PO TABS: 500.0000 mg | ORAL_TABLET | Freq: Every day | ORAL | Status: DC

## 2014-08-12 NOTE — Assessment & Plan Note (Signed)
?   Viral but with worsening cough, prod colored  - Mild to mod, for antibx course,  to f/u any worsening symptoms or concerns, also cough med

## 2014-08-12 NOTE — Patient Instructions (Signed)
You had the flu shot today  Please take all new medication as prescribed - the antibiotic, and cough med  Please continue all other medications as before, and refills have been done if requested.  Please have the pharmacy call with any other refills you may need.  Please keep your appointments with your specialists as you may have planned

## 2014-08-12 NOTE — Progress Notes (Signed)
Pre visit review using our clinic review tool, if applicable. No additional management support is needed unless otherwise documented below in the visit note. 

## 2014-08-12 NOTE — Progress Notes (Signed)
   Subjective:    Patient ID: Nathaniel [name], male    DOB: 02-11-1966, 48 y.o.   MRN: [id number]  HPI  Here with 3 wks worsening URI symtpoms, now with more prod cough - Here with acute onset mild to mod 2-3 wks ST, HA, general weakness and malaise, with prod cough greenish sputum, but Pt denies chest pain, increased sob or doe, wheezing, orthopnea, PND, increased LE swelling, palpitations, dizziness or syncope. Past Medical History  Diagnosis Date  . ADD 05/03/2007  . ALLERGIC RHINITIS 11/25/2007  . ANXIETY 05/03/2007  . CHEST PAIN 07/28/2010  . 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 GERD 07/31/2010  . HYPERLIPIDEMIA 08/01/2007  . INSOMNIA 10/17/2007  . Memory loss 11/23/2007  . PSA, INCREASED 01/06/2010  . SLEEP APNEA, OBSTRUCTIVE, SEVERE 05/03/2007    no cpap use   Past Surgical History  Procedure Laterality Date  . Tonsillectomy    . Nasal sinus surgery      dr Janace Hoard    reports that he has never smoked. He has never used smokeless tobacco. He reports that he drinks alcohol. He reports that he does not use illicit drugs. family history includes Cancer in his maternal grandfather; Liver disease in his sister. There is no history of Colon cancer, Rectal cancer, or Stomach cancer. Allergies  Allergen Reactions  . Amoxicillin-Pot Clavulanate Nausea And Vomiting    REACTION: nausea/GI upset   Current Outpatient Prescriptions on File Prior to Visit  Medication Sig Dispense Refill  . fluticasone (FLONASE) 50 MCG/ACT nasal spray Place 2 sprays into both nostrils daily. 16 g 5  . sildenafil (VIAGRA) 100 MG tablet Take 0.5-1 tablets (50-100 mg total) by mouth daily as needed for erectile dysfunction. 5 tablet 11   No current facility-administered medications on file prior to visit.   Review of Systems All otherwise neg per pt     Objective:   Physical Exam BP 112/72 mmHg  Pulse 91  Temp(Src) 98.1 F (36.7 C) (Oral)  Wt 194 lb 2 oz (88.055 kg)  SpO2 95% VS noted,    Constitutional: Pt appears well-developed, well-nourished.  HENT: Head: NCAT.  Right Ear: External ear normal.  Left Ear: External ear normal.  Eyes: . Pupils are equal, round, and reactive to light. Conjunctivae and EOM are normal Neck: Normal range of motion. Neck supple.  Cardiovascular: Normal rate and regular rhythm.   Pulmonary/Chest: Effort normal and breath sounds slight decreased, no rales or wheezing Neurological: Pt is alert. Not confused , motor grossly intact Skin: Skin is warm. No rash Psychiatric: Pt behavior is normal. No agitation.      Assessment & Plan:

## 2014-09-03 ENCOUNTER — Telehealth: Payer: Self-pay | Admitting: Internal Medicine

## 2014-09-03 MED ORDER — FLUTICASONE PROPIONATE 50 MCG/ACT NA SUSP: 2.0000 | Freq: Every day | NASAL | Status: DC

## 2014-09-03 NOTE — Telephone Encounter (Signed)
Pt requesting generic flonase refill.

## 2014-09-03 NOTE — Telephone Encounter (Signed)
Refill done.  

## 2014-11-14 ENCOUNTER — Telehealth: Payer: Self-pay

## 2014-11-14 NOTE — Telephone Encounter (Signed)
lvm for pt to call back  RE: flu vaccine for 2015/2016?

## 2014-12-10 NOTE — Telephone Encounter (Signed)
Pt called in rec flu vaccine here in nov or dec 2015

## 2015-03-25 ENCOUNTER — Encounter: Payer: Self-pay | Admitting: Internal Medicine

## 2015-03-25 ENCOUNTER — Other Ambulatory Visit (INDEPENDENT_AMBULATORY_CARE_PROVIDER_SITE_OTHER): Payer: BC Managed Care – PPO

## 2015-03-25 ENCOUNTER — Ambulatory Visit (INDEPENDENT_AMBULATORY_CARE_PROVIDER_SITE_OTHER): Payer: BC Managed Care – PPO | Admitting: Internal Medicine

## 2015-03-25 VITALS — BP 138/88 | HR 86 | Temp 98.2°F | Ht 68.0 in | Wt 195.0 lb

## 2015-03-25 DIAGNOSIS — I10 Essential (primary) hypertension: Secondary | ICD-10-CM | POA: Insufficient documentation

## 2015-03-25 DIAGNOSIS — Z0000 Encounter for general adult medical examination without abnormal findings: Secondary | ICD-10-CM

## 2015-03-25 LAB — LIPID PANEL
CHOLESTEROL: 156 mg/dL (ref 0–200)
HDL: 34.6 mg/dL — ABNORMAL LOW (ref >39.00)
LDL Cholesterol: 87 mg/dL (ref 0–99)
NonHDL: 121.4
Total CHOL/HDL Ratio: 5
Triglycerides: 170 mg/dL — ABNORMAL HIGH (ref 0.0–149.0)
VLDL: 34 mg/dL (ref 0.0–40.0)

## 2015-03-25 LAB — URINALYSIS, ROUTINE W REFLEX MICROSCOPIC
Bilirubin Urine: NEGATIVE
HGB URINE DIPSTICK: NEGATIVE
Ketones, ur: NEGATIVE
LEUKOCYTES UA: NEGATIVE
Nitrite: NEGATIVE
PH: 7 (ref 5.0–8.0)
RBC / HPF: NONE SEEN (ref 0–?)
Total Protein, Urine: NEGATIVE
UROBILINOGEN UA: 0.2 (ref 0.0–1.0)
Urine Glucose: NEGATIVE
WBC UA: NONE SEEN (ref 0–?)

## 2015-03-25 LAB — BASIC METABOLIC PANEL
BUN: 15 mg/dL (ref 6–23)
CHLORIDE: 104 meq/L (ref 96–112)
CO2: 28 mEq/L (ref 19–32)
Calcium: 9.6 mg/dL (ref 8.4–10.5)
Creatinine, Ser: 0.98 mg/dL (ref 0.40–1.50)
GFR: 85.62 mL/min (ref >60.00)
Glucose, Bld: 92 mg/dL (ref 70–99)
POTASSIUM: 4.3 meq/L (ref 3.5–5.1)
SODIUM: 137 meq/L (ref 135–145)

## 2015-03-25 LAB — HEPATIC FUNCTION PANEL
ALK PHOS: 74 U/L (ref 39–117)
ALT: 28 U/L (ref 0–53)
AST: 19 U/L (ref 0–37)
Albumin: 4.5 g/dL (ref 3.5–5.2)
BILIRUBIN DIRECT: 0.1 mg/dL (ref 0.0–0.3)
BILIRUBIN TOTAL: 0.5 mg/dL (ref 0.2–1.2)
Total Protein: 7.2 g/dL (ref 6.0–8.3)

## 2015-03-25 LAB — CBC WITH DIFFERENTIAL/PLATELET
BASOS PCT: 0.5 % (ref 0.0–3.0)
Basophils Absolute: 0 10*3/uL (ref 0.0–0.1)
Eosinophils Absolute: 0.1 10*3/uL (ref 0.0–0.7)
Eosinophils Relative: 1.7 % (ref 0.0–5.0)
HCT: 48.9 % (ref 39.0–52.0)
Hemoglobin: 16.8 g/dL (ref 13.0–17.0)
Lymphocytes Relative: 26.9 % (ref 12.0–46.0)
Lymphs Abs: 1.7 10*3/uL (ref 0.7–4.0)
MCHC: 34.4 g/dL (ref 30.0–36.0)
MCV: 87.7 fl (ref 78.0–100.0)
MONO ABS: 0.7 10*3/uL (ref 0.1–1.0)
Monocytes Relative: 11.5 % (ref 3.0–12.0)
NEUTROS ABS: 3.8 10*3/uL (ref 1.4–7.7)
Neutrophils Relative %: 59.4 % (ref 43.0–77.0)
PLATELETS: 242 10*3/uL (ref 150.0–400.0)
RBC: 5.58 Mil/uL (ref 4.22–5.81)
RDW: 13 % (ref 11.5–15.5)
WBC: 6.3 10*3/uL (ref 4.0–10.5)

## 2015-03-25 LAB — TSH: TSH: 2.38 u[IU]/mL (ref 0.35–4.50)

## 2015-03-25 MED ORDER — ASPIRIN EC 81 MG PO TBEC: 81.0000 mg | DELAYED_RELEASE_TABLET | Freq: Every day | ORAL | Status: DC

## 2015-03-25 MED ORDER — LOSARTAN POTASSIUM 50 MG PO TABS: 50.0000 mg | ORAL_TABLET | Freq: Every day | ORAL | Status: DC

## 2015-03-25 NOTE — Progress Notes (Signed)
Pre visit review using our clinic review tool, if applicable. No additional management support is needed unless otherwise documented below in the visit note. 

## 2015-03-25 NOTE — Assessment & Plan Note (Signed)
Borderline elevated, ok to start losartan 50 qd, cont monitor BP at home and next visit

## 2015-03-25 NOTE — Progress Notes (Signed)
Subjective:    Patient ID: Nathaniel [name], male    DOB: 03-02-1966, 49 y.o.   MRN: [id number]  HPI  Here for wellness and f/u;  Overall doing ok;  Pt denies Chest pain, worsening SOB, DOE, wheezing, orthopnea, PND, worsening LE edema, palpitations, dizziness or syncope.  Pt denies neurological change such as new headache, facial or extremity weakness.  Pt denies polydipsia, polyuria, or low sugar symptoms. Pt states overall good compliance with treatment and medications, good tolerability, and has been trying to follow appropriate diet.  Pt denies worsening depressive symptoms, suicidal ideation or panic. No fever, night sweats, wt loss, loss of appetite, or other constitutional symptoms.  Pt states good ability with ADL's, has low fall risk, home safety reviewed and adequate, no other significant changes in hearing or vision, and only occasionally active with exercise.  Bp has been mildly elevated recently, has gained wt - lowest wt in past has been 165. BP high at [facility] 136/77f BP Readings from Last 3 Encounters:  03/25/15 138/88  08/12/14 112/72  06/24/14 137/88    Wt Readings from Last 3 Encounters:  03/25/15 195 lb (88.451 kg)  08/12/14 194 lb 2 oz (88.055 kg)  06/24/14 193 lb (87.544 kg)   Past Medical History  Diagnosis Date  . ADD 05/03/2007  . ALLERGIC RHINITIS 11/25/2007  . ANXIETY 05/03/2007  . CHEST PAIN 07/28/2010  . 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 GERD 07/31/2010  . HYPERLIPIDEMIA 08/01/2007  . INSOMNIA 10/17/2007  . Memory loss 11/23/2007  . PSA, INCREASED 01/06/2010  . SLEEP APNEA, OBSTRUCTIVE, SEVERE 05/03/2007    no cpap use   Past Surgical History  Procedure Laterality Date  . Tonsillectomy    . Nasal sinus surgery      dr Janace Hoard    reports that he has never smoked. He has never used smokeless tobacco. He reports that he drinks alcohol. He reports that he does not use illicit drugs. family history includes Cancer in his maternal grandfather; Liver  disease in his sister. There is no history of Colon cancer, Rectal cancer, or Stomach cancer. Allergies  Allergen Reactions  . Amoxicillin-Pot Clavulanate Nausea And Vomiting    REACTION: nausea/GI upset    Current Outpatient Prescriptions on File Prior to Visit  Medication Sig Dispense Refill  . fluticasone (FLONASE) 50 MCG/ACT nasal spray Place 2 sprays into both nostrils daily. 16 g 11  . HYDROcodone-homatropine (HYCODAN) 5-1.5 MG/5ML syrup Take 5 mLs by mouth every 6 (six) hours as needed for cough. (Patient not taking: Reported on 03/25/2015) 180 mL 0  . levofloxacin (LEVAQUIN) 500 MG tablet Take 1 tablet (500 mg total) by mouth daily. (Patient not taking: Reported on 03/25/2015) 10 tablet 0  . sildenafil (VIAGRA) 100 MG tablet Take 0.5-1 tablets (50-100 mg total) by mouth daily as needed for erectile dysfunction. 5 tablet 11   No current facility-administered medications on file prior to visit.   Review of Systems Constitutional: Negative for increased diaphoresis, other activity, appetite or siginficant weight change other than noted HENT: Negative for worsening hearing loss, ear pain, facial swelling, mouth sores and neck stiffness.   Eyes: Negative for other worsening pain, redness or visual disturbance.  Respiratory: Negative for shortness of breath and wheezing  Cardiovascular: Negative for chest pain and palpitations.  Gastrointestinal: Negative for diarrhea, blood in stool, abdominal distention or other pain Genitourinary: Negative for hematuria, flank pain or change in urine volume.  Musculoskeletal: Negative for myalgias or other joint  complaints.  Skin: Negative for color change and wound or drainage.  Neurological: Negative for syncope and numbness. other than noted Hematological: Negative for adenopathy. or other swelling Psychiatric/Behavioral: Negative for hallucinations, SI, self-injury, decreased concentration or other worsening agitation.      Objective:    Physical Exam BP 138/88 mmHg  Pulse 86  Temp(Src) 98.2 F (36.8 C) (Oral)  Ht 5\' 8"  (1.727 m)  Wt 195 lb (88.451 kg)  BMI 29.66 kg/m2  SpO2 97% VS noted,  Constitutional: Pt is oriented to person, place, and time. Appears well-developed and well-nourished, in no significant distress Head: Normocephalic and atraumatic.  Right Ear: External ear normal.  Left Ear: External ear normal.  Nose: Nose normal.  Mouth/Throat: Oropharynx is clear and moist.  Eyes: Conjunctivae and EOM are normal. Pupils are equal, round, and reactive to light.  Neck: Normal range of motion. Neck supple. No JVD present. No tracheal deviation present or significant neck LA or mass Cardiovascular: Normal rate, regular rhythm, normal heart sounds and intact distal pulses.   Pulmonary/Chest: Effort normal and breath sounds without rales or wheezing  Abdominal: Soft. Bowel sounds are normal. NT. No HSM  Musculoskeletal: Normal range of motion. Exhibits no edema.  Lymphadenopathy:  Has no cervical adenopathy.  Neurological: Pt is alert and oriented to person, place, and time. Pt has normal reflexes. No cranial nerve deficit. Motor grossly intact Skin: Skin is warm and dry. No rash noted.  Psychiatric:  Has normal mood and affect. Behavior is normal.     Assessment & Plan:

## 2015-03-25 NOTE — Patient Instructions (Addendum)
Please take all new medication as prescribed - the losartan 50mg    Please continue all other medications as before, and refills have been done if requested.  Please have the pharmacy call with any other refills you may need.  Please continue your efforts at being more active, low cholesterol diet, and weight control.  You are otherwise up to date with prevention measures today.  Please keep your appointments with your specialists as you may have planned  Please go to the LAB in the Basement (turn left off the elevator) for the tests to be done today  You will be contacted by phone if any changes need to be made immediately.  Otherwise, you will receive a letter about your results with an explanation, but please check with MyChart first.  Please remember to sign up for MyChart if you have not done so, as this will be important to you in the future with finding out test results, communicating by private email, and scheduling acute appointments online when needed.  Please return in 1 year for your yearly visit, or sooner if needed, with Lab testing done 3-5 days before

## 2015-03-25 NOTE — Assessment & Plan Note (Signed)

## 2015-04-22 ENCOUNTER — Telehealth: Payer: Self-pay | Admitting: Internal Medicine

## 2015-04-22 NOTE — Telephone Encounter (Signed)
Patient has questions about b/pmedication Dr Jenny Reichmann prescribed. Please advise

## 2015-04-22 NOTE — Telephone Encounter (Signed)
Left message on machine for pt to return my call  

## 2015-04-24 NOTE — Telephone Encounter (Signed)
Pt called to ask if BP medication are live-long medications. Pt states that he has been trying to make lifestyle changes. Pt was advised that HTN is a closely monitored condition and it will be regularly evaluated and adjusted/discontinued is needed

## 2015-05-28 ENCOUNTER — Encounter: Payer: BC Managed Care – PPO | Admitting: Internal Medicine

## 2015-07-30 ENCOUNTER — Encounter: Payer: Self-pay | Admitting: Internal Medicine

## 2015-07-30 ENCOUNTER — Ambulatory Visit (INDEPENDENT_AMBULATORY_CARE_PROVIDER_SITE_OTHER): Payer: BC Managed Care – PPO | Admitting: Internal Medicine

## 2015-07-30 VITALS — BP 116/72 | HR 82 | Temp 98.0°F | Ht 68.0 in | Wt 201.0 lb

## 2015-07-30 DIAGNOSIS — Z23 Encounter for immunization: Secondary | ICD-10-CM | POA: Diagnosis not present

## 2015-07-30 DIAGNOSIS — E785 Hyperlipidemia, unspecified: Secondary | ICD-10-CM | POA: Diagnosis not present

## 2015-07-30 DIAGNOSIS — I10 Essential (primary) hypertension: Secondary | ICD-10-CM | POA: Diagnosis not present

## 2015-07-30 DIAGNOSIS — H6691 Otitis media, unspecified, right ear: Secondary | ICD-10-CM | POA: Insufficient documentation

## 2015-07-30 MED ORDER — AZITHROMYCIN 250 MG PO TABS: ORAL_TABLET | ORAL | Status: DC

## 2015-07-30 NOTE — Assessment & Plan Note (Signed)
stable overall by history and exam, recent data reviewed with pt, and pt to continue medical treatment as before,  to f/u any worsening symptoms or concerns Lab Results  Component Value Date   LDLCALC 87 03/25/2015

## 2015-07-30 NOTE — Assessment & Plan Note (Addendum)
Mild to mod, for antibx course,  to f/u any worsening symptoms or concerns, also for mucinex otc prn

## 2015-07-30 NOTE — Progress Notes (Signed)
Subjective:    Patient ID: Nathaniel [name], male    DOB: 07/30/1966, 49 y.o.   MRN: [id number]  HPI   Here with 2-3 days acute onset right ear pain and pressure, nausea without vomiting,  headache, general weakness and malaise, with mild ST and cough, but pt denies chest pain, wheezing, increased sob or doe, orthopnea, PND, increased LE swelling, palpitations, dizziness or syncope.  No help with sudafed. Has some reduced hearing on the right as well.  Would like to review chol as well.  Pt denies new neurological symptoms such as new headache, or facial or extremity weakness or numbness  Pt denies polydipsia, polyuria, has missed a few cozaar recently Past Medical History  Diagnosis Date  . ADD 05/03/2007  . ALLERGIC RHINITIS 11/25/2007  . ANXIETY 05/03/2007  . CHEST PAIN 07/28/2010  . 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 GERD 07/31/2010  . HYPERLIPIDEMIA 08/01/2007  . INSOMNIA 10/17/2007  . Memory loss 11/23/2007  . PSA, INCREASED 01/06/2010  . SLEEP APNEA, OBSTRUCTIVE, SEVERE 05/03/2007    no cpap use   Past Surgical History  Procedure Laterality Date  . Tonsillectomy    . Nasal sinus surgery      dr Janace Hoard    reports that he has never smoked. He has never used smokeless tobacco. He reports that he drinks alcohol. He reports that he does not use illicit drugs. family history includes Cancer in his maternal grandfather; Liver disease in his sister. There is no history of Colon cancer, Rectal cancer, or Stomach cancer. Allergies  Allergen Reactions  . Amoxicillin-Pot Clavulanate Nausea And Vomiting    REACTION: nausea/GI upset   Current Outpatient Prescriptions on File Prior to Visit  Medication Sig Dispense Refill  . aspirin EC 81 MG tablet Take 1 tablet (81 mg total) by mouth daily. 90 tablet 11  . fluticasone (FLONASE) 50 MCG/ACT nasal spray Place 2 sprays into both nostrils daily. 16 g 11  . losartan (COZAAR) 50 MG tablet Take 1 tablet (50 mg total) by mouth daily. 90 tablet  3  . sildenafil (VIAGRA) 100 MG tablet Take 0.5-1 tablets (50-100 mg total) by mouth daily as needed for erectile dysfunction. 5 tablet 11   No current facility-administered medications on file prior to visit.   Review of Systems  Constitutional: Negative for unusual diaphoresis or night sweats HENT: Negative for ringing in ear or discharge Eyes: Negative for double vision or worsening visual disturbance.  Respiratory: Negative for choking and stridor.   Gastrointestinal: Negative for vomiting or other signifcant bowel change Genitourinary: Negative for hematuria or change in urine volume.  Musculoskeletal: Negative for other MSK pain or swelling Skin: Negative for color change and worsening wound.  Neurological: Negative for tremors and numbness other than noted  Psychiatric/Behavioral: Negative for decreased concentration or agitation other than above       Objective:   Physical Exam BP 116/72 mmHg  Pulse 82  Temp(Src) 98 F (36.7 C) (Oral)  Ht 5\' 8"  (1.727 m)  Wt 201 lb (91.173 kg)  BMI 30.57 kg/m2  SpO2 97% VS noted, mild ill Constitutional: Pt appears in no significant distress HENT: Head: NCAT.  Right Ear: External ear normal.  Left Ear: External ear normal.  Eyes: . Pupils are equal, round, and reactive to light. Conjunctivae and EOM are normal. Bilat tm's with mild erythema right > left with effusion on the right, slight bulging.  Max sinus areas non tender.  Pharynx with mild erythema, no exudate  Neck: Normal range of motion. Neck supple.  Cardiovascular: Normal rate and regular rhythm.   Pulmonary/Chest: Effort normal and breath sounds without rales or wheezing.  Neurological: Pt is alert. Not confused , motor grossly intact Skin: Skin is warm. No rash, no LE edema Psychiatric: Pt behavior is normal. No agitation.     Assessment & Plan:

## 2015-07-30 NOTE — Progress Notes (Signed)
Pre visit review using our clinic review tool, if applicable. No additional management support is needed unless otherwise documented below in the visit note. 

## 2015-07-30 NOTE — Patient Instructions (Signed)
Please take all new medication as prescribed - the antibiotic  You can also take Delsym OTC for cough, and/or Mucinex (or it's generic off brand) for congestion, and tylenol as needed for pain.  Please continue all other medications as before, and refills have been done if requested.  Please have the pharmacy call with any other refills you may need.  Please continue your efforts at being more active, low cholesterol diet, and weight control.  Please keep your appointments with your specialists as you may have planned     

## 2015-07-30 NOTE — Assessment & Plan Note (Signed)
stable overall by history and exam, recent data reviewed with pt, and pt to continue medical treatment as before,  to f/u any worsening symptoms or concerns BP Readings from Last 3 Encounters:  07/30/15 116/72  03/25/15 138/88  08/12/14 112/72

## 2016-01-07 ENCOUNTER — Telehealth: Payer: Self-pay | Admitting: Internal Medicine

## 2016-01-07 MED ORDER — CIPROFLOXACIN HCL 500 MG PO TABS: 500.0000 mg | ORAL_TABLET | Freq: Two times a day (BID) | ORAL | Status: DC

## 2016-01-07 NOTE — Telephone Encounter (Signed)
Please advise can we do this 

## 2016-01-07 NOTE — Telephone Encounter (Signed)
Pt is going to the [place] and would like to take some med with him in case he drinks water and get sick.

## 2016-01-07 NOTE — Telephone Encounter (Signed)
rx done erx 

## 2016-05-30 ENCOUNTER — Other Ambulatory Visit: Payer: Self-pay | Admitting: Internal Medicine

## 2016-08-02 ENCOUNTER — Telehealth: Payer: Self-pay | Admitting: Internal Medicine

## 2016-08-02 NOTE — Telephone Encounter (Signed)
[facility]  Patient Name: Nathaniel [name]  DOB: Jan 27, 1966    Initial Comment Caller states his eyes are red, watering and sore.    Nurse Assessment  Nurse: Harlow Mares, RN, Suanne Marker Date/Time [name] Time): 08/02/2016 2:36:05 PM  Confirm and document reason for call. If symptomatic, describe symptoms. You must click the next button to save text entered. ---Caller states his eyes are red and puffy on the lower lid, watering and sore.  Has the patient traveled out of the country within the last 30 days? ---Not Applicable  Does the patient have any new or worsening symptoms? ---Yes  Will a triage be completed? ---Yes  Related visit to physician within the last 2 weeks? ---No  Does the PT have any chronic conditions? (i.e. diabetes, asthma, etc.) ---Yes  List chronic conditions. ---HTN  Is this a behavioral health or substance abuse call? ---No     Guidelines    Guideline Title Affirmed Question Affirmed Notes  Sty Sty on eyelid (all triage questions negative)    Final Disposition User   [place], RN, Suanne Marker    Disagree/Comply: Comply

## 2016-09-27 ENCOUNTER — Ambulatory Visit: Payer: BC Managed Care – PPO | Admitting: Internal Medicine

## 2016-10-05 ENCOUNTER — Ambulatory Visit (INDEPENDENT_AMBULATORY_CARE_PROVIDER_SITE_OTHER): Payer: BLUE CROSS/BLUE SHIELD | Admitting: Internal Medicine

## 2016-10-05 ENCOUNTER — Encounter: Payer: Self-pay | Admitting: Internal Medicine

## 2016-10-05 ENCOUNTER — Other Ambulatory Visit (INDEPENDENT_AMBULATORY_CARE_PROVIDER_SITE_OTHER): Payer: BLUE CROSS/BLUE SHIELD

## 2016-10-05 VITALS — BP 128/74 | HR 85 | Temp 98.5°F | Resp 20 | Wt 212.0 lb

## 2016-10-05 DIAGNOSIS — Z23 Encounter for immunization: Secondary | ICD-10-CM

## 2016-10-05 DIAGNOSIS — E669 Obesity, unspecified: Secondary | ICD-10-CM | POA: Insufficient documentation

## 2016-10-05 DIAGNOSIS — Z0001 Encounter for general adult medical examination with abnormal findings: Secondary | ICD-10-CM

## 2016-10-05 DIAGNOSIS — Z1159 Encounter for screening for other viral diseases: Secondary | ICD-10-CM

## 2016-10-05 DIAGNOSIS — I10 Essential (primary) hypertension: Secondary | ICD-10-CM

## 2016-10-05 DIAGNOSIS — G4733 Obstructive sleep apnea (adult) (pediatric): Secondary | ICD-10-CM

## 2016-10-05 LAB — CBC WITH DIFFERENTIAL/PLATELET
BASOS ABS: 0 10*3/uL (ref 0.0–0.1)
Basophils Relative: 0.2 % (ref 0.0–3.0)
EOS ABS: 0.1 10*3/uL (ref 0.0–0.7)
Eosinophils Relative: 1.5 % (ref 0.0–5.0)
HEMATOCRIT: 47 % (ref 39.0–52.0)
HEMOGLOBIN: 16.3 g/dL (ref 13.0–17.0)
LYMPHS PCT: 23.4 % (ref 12.0–46.0)
Lymphs Abs: 1.9 10*3/uL (ref 0.7–4.0)
MCHC: 34.8 g/dL (ref 30.0–36.0)
MCV: 87.4 fl (ref 78.0–100.0)
MONOS PCT: 10.4 % (ref 3.0–12.0)
Monocytes Absolute: 0.8 10*3/uL (ref 0.1–1.0)
NEUTROS ABS: 5.2 10*3/uL (ref 1.4–7.7)
Neutrophils Relative %: 64.5 % (ref 43.0–77.0)
PLATELETS: 273 10*3/uL (ref 150.0–400.0)
RBC: 5.38 Mil/uL (ref 4.22–5.81)
RDW: 13.4 % (ref 11.5–15.5)
WBC: 8 10*3/uL (ref 4.0–10.5)

## 2016-10-05 LAB — LIPID PANEL
CHOL/HDL RATIO: 5
CHOLESTEROL: 174 mg/dL (ref 0–200)
HDL: 34.5 mg/dL — AB (ref >39.00)
NONHDL: 139.11
TRIGLYCERIDES: 232 mg/dL — AB (ref 0.0–149.0)
VLDL: 46.4 mg/dL — ABNORMAL HIGH (ref 0.0–40.0)

## 2016-10-05 LAB — BASIC METABOLIC PANEL
BUN: 11 mg/dL (ref 6–23)
CALCIUM: 9.4 mg/dL (ref 8.4–10.5)
CHLORIDE: 104 meq/L (ref 96–112)
CO2: 27 mEq/L (ref 19–32)
CREATININE: 0.9 mg/dL (ref 0.40–1.50)
GFR: 93.9 mL/min (ref >60.00)
Glucose, Bld: 96 mg/dL (ref 70–99)
Potassium: 4.1 mEq/L (ref 3.5–5.1)
Sodium: 138 mEq/L (ref 135–145)

## 2016-10-05 LAB — URINALYSIS, ROUTINE W REFLEX MICROSCOPIC
BILIRUBIN URINE: NEGATIVE
HGB URINE DIPSTICK: NEGATIVE
KETONES UR: NEGATIVE
LEUKOCYTES UA: NEGATIVE
NITRITE: NEGATIVE
PH: 7 (ref 5.0–8.0)
RBC / HPF: NONE SEEN (ref 0–?)
Specific Gravity, Urine: 1.01 (ref 1.000–1.030)
Total Protein, Urine: NEGATIVE
UROBILINOGEN UA: 0.2 (ref 0.0–1.0)
Urine Glucose: NEGATIVE
WBC UA: NONE SEEN (ref 0–?)

## 2016-10-05 LAB — TSH: TSH: 3.22 u[IU]/mL (ref 0.35–4.50)

## 2016-10-05 LAB — HEPATIC FUNCTION PANEL
ALT: 30 U/L (ref 0–53)
AST: 19 U/L (ref 0–37)
Albumin: 4.6 g/dL (ref 3.5–5.2)
Alkaline Phosphatase: 82 U/L (ref 39–117)
BILIRUBIN DIRECT: 0.1 mg/dL (ref 0.0–0.3)
TOTAL PROTEIN: 6.8 g/dL (ref 6.0–8.3)
Total Bilirubin: 0.5 mg/dL (ref 0.2–1.2)

## 2016-10-05 LAB — LDL CHOLESTEROL, DIRECT: Direct LDL: 110 mg/dL

## 2016-10-05 LAB — PSA: PSA: 0.49 ng/mL (ref 0.10–4.00)

## 2016-10-05 MED ORDER — LOSARTAN POTASSIUM 50 MG PO TABS: 50.0000 mg | ORAL_TABLET | Freq: Every day | ORAL | 3 refills | Status: DC

## 2016-10-05 MED ORDER — LOSARTAN POTASSIUM 100 MG PO TABS: 100.0000 mg | ORAL_TABLET | Freq: Every day | ORAL | 3 refills | Status: DC

## 2016-10-05 NOTE — Patient Instructions (Addendum)
You had the flu shot today, and the Tdap tetanus shot today  Please continue all other medications as before, and refills have been done if requested.  Please have the pharmacy call with any other refills you may need.  Please continue your efforts at being more active, low cholesterol diet, and weight control.  You are otherwise up to date with prevention measures today.  Please keep your appointments with your specialists as you may have planned  You will be contacted regarding the referral for: pulmonary  Please go to the LAB in the Basement (turn left off the elevator) for the tests to be done today  You will be contacted by phone if any changes need to be made immediately.  Otherwise, you will receive a letter about your results with an explanation, but please check with MyChart first.  Please remember to sign up for MyChart if you have not done so, as this will be important to you in the future with finding out test results, communicating by private email, and scheduling acute appointments online when needed.  Please return in 1 year for your yearly visit, or sooner if needed, with Lab testing done 3-5 days before

## 2016-10-05 NOTE — Progress Notes (Signed)
Subjective:    Patient ID: Nathaniel [name], male    DOB: October 21, 1965, 51 y.o.   MRN: [id number]  HPI  Here for wellness and f/u;  Overall doing ok;  Pt denies Chest pain, worsening SOB, DOE, wheezing, orthopnea, PND, worsening LE edema, palpitations, dizziness or syncope.  Pt denies neurological change such as new headache, facial or extremity weakness.  Pt denies polydipsia, polyuria, or low sugar symptoms. Pt states overall good compliance with treatment and medications, good tolerability, and has been trying to follow appropriate diet.  Pt denies worsening depressive symptoms, suicidal ideation or panic. No fever, night sweats, wt loss, loss of appetite, or other constitutional symptoms.  Pt states good ability with ADL's, has low fall risk, home safety reviewed and adequate, no other significant changes in hearing or vision, and only occasionally active with exercise.  Has been working more hours lately,  Has gained significant weight. Wt Readings from Last 3 Encounters:  10/05/16 212 lb (96.2 kg)  07/30/15 201 lb (91.2 kg)  03/25/15 195 lb (88.5 kg)  BP ok at home with sbp < 140, though occas has dbp 90 and app on his phone says he is "high".   Due for OSA f/u, not using cpap as mask is difficult Past Medical History:  Diagnosis Date  . ADD 05/03/2007  . ALLERGIC RHINITIS 11/25/2007  . ANXIETY 05/03/2007  . CHEST PAIN 07/28/2010  . 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 GERD 07/31/2010  . HYPERLIPIDEMIA 08/01/2007  . INSOMNIA 10/17/2007  . Memory loss 11/23/2007  . PSA, INCREASED 01/06/2010  . SLEEP APNEA, OBSTRUCTIVE, SEVERE 05/03/2007   no cpap use   Past Surgical History:  Procedure Laterality Date  . NASAL SINUS SURGERY     dr Janace Hoard  . TONSILLECTOMY      reports that he has never smoked. He has never used smokeless tobacco. He reports that he drinks alcohol. He reports that he does not use drugs. family history includes Cancer in his maternal grandfather; Liver disease in his  sister. Allergies  Allergen Reactions  . Amoxicillin-Pot Clavulanate Nausea And Vomiting    REACTION: nausea/GI upset   Current Outpatient Prescriptions on File Prior to Visit  Medication Sig Dispense Refill  . aspirin EC 81 MG tablet Take 1 tablet (81 mg total) by mouth daily. 90 tablet 11  . sildenafil (VIAGRA) 100 MG tablet Take 0.5-1 tablets (50-100 mg total) by mouth daily as needed for erectile dysfunction. 5 tablet 11   No current facility-administered medications on file prior to visit.    Review of Systems Constitutional: Negative for increased diaphoresis, or other activity, appetite or siginficant weight change other than noted HENT: Negative for worsening hearing loss, ear pain, facial swelling, mouth sores and neck stiffness.   Eyes: Negative for other worsening pain, redness or visual disturbance.  Respiratory: Negative for choking or stridor Cardiovascular: Negative for other chest pain and palpitations.  Gastrointestinal: Negative for worsening diarrhea, blood in stool, or abdominal distention Genitourinary: Negative for hematuria, flank pain or change in urine volume.  Musculoskeletal: Negative for myalgias or other joint complaints.  Skin: Negative for other color change and wound or drainage.  Neurological: Negative for syncope and numbness. other than noted Hematological: Negative for adenopathy. or other swelling Psychiatric/Behavioral: Negative for hallucinations, SI, self-injury, decreased concentration or other worsening agitation.  All other system neg per pt    Objective:   Physical Exam BP 128/74   Pulse 85   Temp 98.5 F (36.9  C) (Oral)   Resp 20   Wt 212 lb (96.2 kg)   SpO2 97%   BMI 32.23 kg/m  VS noted,  Constitutional: Pt appears in no apparent distress HENT: Head: NCAT.  Right Ear: External ear normal.  Left Ear: External ear normal.  Eyes: . Pupils are equal, round, and reactive to light. Conjunctivae and EOM are normal Neck: Normal range  of motion. Neck supple.  Cardiovascular: Normal rate and regular rhythm.   Pulmonary/Chest: Effort normal and breath sounds without rales or wheezing.  Abd:  Soft, NT, ND, + BS Neurological: Pt is alert. Not confused , motor grossly intact Skin: Skin is warm. No rash, no LE edema Psychiatric: Pt behavior is normal. No agitation.  No other new exam findings    Assessment & Plan:

## 2016-10-05 NOTE — Progress Notes (Signed)
Pre visit review using our clinic review tool, if applicable. No additional management support is needed unless otherwise documented below in the visit note. 

## 2016-10-06 LAB — HEPATITIS C ANTIBODY: HCV Ab: NEGATIVE

## 2016-10-09 NOTE — Assessment & Plan Note (Signed)
Ok for referral pulm for osa f/u

## 2016-10-09 NOTE — Assessment & Plan Note (Signed)
stable overall by history and exam, recent data reviewed with pt, and pt to continue medical treatment as before,  to f/u any worsening symptoms or concerns BP Readings from Last 3 Encounters:  10/05/16 128/74  07/30/15 116/72  03/25/15 138/88

## 2016-10-09 NOTE — Assessment & Plan Note (Signed)

## 2016-10-21 ENCOUNTER — Encounter: Payer: Self-pay | Admitting: Internal Medicine

## 2016-11-03 ENCOUNTER — Telehealth: Payer: Self-pay | Admitting: Internal Medicine

## 2016-11-03 NOTE — Telephone Encounter (Signed)
Patient Name: Nathaniel [name]  DOB: 06/08/1966    Initial Comment caller states he woke up with upset stomach, diarrhea, nausea and feels like he has a fever   Nurse Assessment  Nurse: Julien Girt, RN, Almyra Free Date/Time [name] Time): 11/03/2016 4:21:16 PM  Confirm and document reason for call. If symptomatic, describe symptoms. ---Caller states he woke up this morning with stomach upset/cramping and began to have diarrhea. Then he developed nausea, vomited , felt better but is nauseated and now having constant upper abdominal pain. Marland Kitchen He has been chilling, temp is 100.2/ear. He urinated 40 mins ago.  Does the patient have any new or worsening symptoms? ---Yes  Will a triage be completed? ---Yes  Related visit to physician within the last 2 weeks? ---No  Does the PT have any chronic conditions? (i.e. diabetes, asthma, etc.) ---Yes  List chronic conditions. ---Htn  Is this a behavioral health or substance abuse call? ---No     Guidelines    Guideline Title Affirmed Question Affirmed Notes  Diarrhea [1] Constant abdominal pain AND [2] present > 2 hours    Final Disposition User   See Physician within 4 Hours (or PCP triage) Julien Girt, RN, Almyra Free    Referrals  [facility]   Reason: Called back line, spoke to [place] who provided information to Dr. Jenny Reichmann. He agrees that pt needs to be evaluated in ED or UC.

## 2016-12-14 ENCOUNTER — Encounter: Payer: Self-pay | Admitting: Pulmonary Disease

## 2016-12-14 ENCOUNTER — Ambulatory Visit (INDEPENDENT_AMBULATORY_CARE_PROVIDER_SITE_OTHER): Payer: BLUE CROSS/BLUE SHIELD | Admitting: Pulmonary Disease

## 2016-12-14 VITALS — BP 122/74 | HR 85 | Ht 68.0 in | Wt 214.4 lb

## 2016-12-14 DIAGNOSIS — G4710 Hypersomnia, unspecified: Secondary | ICD-10-CM

## 2016-12-14 DIAGNOSIS — G4733 Obstructive sleep apnea (adult) (pediatric): Secondary | ICD-10-CM

## 2016-12-14 NOTE — Patient Instructions (Signed)

## 2016-12-14 NOTE — Progress Notes (Signed)
Subjective:    Patient ID: Nathaniel [name], male    DOB: 29-Aug-1966, 51 y.o.   MRN: [id number]  HPI   This is the case of TREYLAN MCCLINTOCK [name], 51 y.o. Male, who was referred by Dr. [name] in consultation regarding OSA.    As you very well know, patient is a non smoker, not known to have asthma or copd.  He was diagnosed with severe OSA in 2006.  He had a  Lab study and his RDI was 56. He was very symptomatic with snoring, gasping, choking, unrefreshed sleep, frequent awakenings, fatigue, hypersomnia. He was started on cpap 9 cm water.   He had issues falling asleep with cpap. Too much pressure. He ended up using it for couple months only. He tried an oral device from [place], [place]. He has had it for 7 years. Initially was working well but through the years, the oral device is lost its luster.  Patient has  gained 30 pounds through the years. He also tries to sleep on his side. Despite that, his symptoms have gotten worse.   Has snoring, witnessed apneas, gasping, choking, has frequent awakenings at night.  He does Adult nurse estate. Gets sleepy driving.   Feels unrefreshed in am.  (-) abnormal behavior in sleep.     Past Medical History:  Diagnosis Date  . ADD 05/03/2007  . ALLERGIC RHINITIS 11/25/2007  . ANXIETY 05/03/2007  . CHEST PAIN 07/28/2010  . 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 GERD 07/31/2010  . HYPERLIPIDEMIA 08/01/2007  . INSOMNIA 10/17/2007  . Memory loss 11/23/2007  . PSA, INCREASED 01/06/2010  . SLEEP APNEA, OBSTRUCTIVE, SEVERE 05/03/2007   no cpap use   (-) CA, DVT  Family History  Problem Relation Age of Onset  . Cancer Maternal Grandfather     prostate cancer  . Liver disease Sister   . Colon cancer Neg Hx   . Rectal cancer Neg Hx   . Stomach cancer Neg Hx      Past Surgical History:  Procedure Laterality Date  . NASAL SINUS SURGERY     dr Janace Hoard  . TONSILLECTOMY      Social History   Social History  . Marital status: Married     Spouse name: N/A  . Number of children: N/A  . Years of education: N/A   Occupational History  . Not on file.   Social History Main Topics  . Smoking status: Never Smoker  . Smokeless tobacco: Never Used  . Alcohol use Yes     Comment: socially on weekends  . Drug use: No  . Sexual activity: Not on file   Other Topics Concern  . Not on file   Social History Narrative  . No narrative on file     Allergies  Allergen Reactions  . Amoxicillin-Pot Clavulanate Nausea And Vomiting    REACTION: nausea/GI upset     Outpatient Medications Prior to Visit  Medication Sig Dispense Refill  . aspirin EC 81 MG tablet Take 1 tablet (81 mg total) by mouth daily. 90 tablet 11  . losartan (COZAAR) 50 MG tablet Take 1 tablet (50 mg total) by mouth daily. 90 tablet 3  . sildenafil (VIAGRA) 100 MG tablet Take 0.5-1 tablets (50-100 mg total) by mouth daily as needed for erectile dysfunction. 5 tablet 11   No facility-administered medications prior to visit.    No orders of the defined types were placed in this encounter.  Review of Systems  Constitutional: Positive for unexpected weight change. Negative for fever.  HENT: Negative.  Negative for congestion, dental problem, ear pain, nosebleeds, postnasal drip, rhinorrhea, sinus pressure, sneezing, sore throat and trouble swallowing.   Eyes: Negative.  Negative for redness and itching.  Respiratory: Negative.  Negative for cough, chest tightness, shortness of breath and wheezing.   Cardiovascular: Negative.  Negative for palpitations and leg swelling.  Gastrointestinal: Negative.  Negative for nausea and vomiting.  Endocrine: Negative.   Genitourinary: Negative.  Negative for dysuria.  Musculoskeletal: Negative.  Negative for joint swelling.  Skin: Negative.  Negative for rash.  Allergic/Immunologic: Negative.  Negative for environmental allergies, food allergies and immunocompromised state.  Neurological: Negative.  Negative for  headaches.  Hematological: Negative.  Does not bruise/bleed easily.  Psychiatric/Behavioral: Negative.  Negative for dysphoric mood. The patient is not nervous/anxious.        Objective:   Physical Exam  Vitals:  Vitals:   12/14/16 1446  BP: 122/74  Pulse: 85  SpO2: 97%  Weight: 214 lb 6.4 oz (97.3 kg)  Height: 5\' 8"  (1.727 m)    Constitutional/General:  Pleasant, well-nourished, well-developed, not in any distress,  Comfortably seating.  Well kempt  Body mass index is 32.6 kg/m. Wt Readings from Last 3 Encounters:  12/14/16 214 lb 6.4 oz (97.3 kg)  10/05/16 212 lb (96.2 kg)  07/30/15 201 lb (91.2 kg)     HEENT: Pupils equal and reactive to light and accommodation. Anicteric sclerae. Normal nasal mucosa.   No oral  lesions,  mouth clear,  oropharynx clear, no postnasal drip. (-) Oral thrush. No dental caries.  Airway - Mallampati class [name]  Neck: No masses. Midline trachea. No JVD, (-) LAD. (-) bruits appreciated.  Respiratory/Chest: Grossly normal chest. (-) deformity. (-) Accessory muscle use.  Symmetric expansion. (-) Tenderness on palpation.  Resonant on percussion.  Diminished BS on both lower lung zones. (-) wheezing, crackles, rhonchi (-) egophony  Cardiovascular: Regular rate and  rhythm, heart sounds normal, no murmur or gallops, no peripheral edema  Gastrointestinal:  Normal bowel sounds. Soft, non-tender. No hepatosplenomegaly.  (-) masses.   Musculoskeletal:  Normal muscle tone. Normal gait.   Extremities: Grossly normal. (-) clubbing, cyanosis.  (-) edema  Skin: (-) rash,lesions seen.   Neurological/Psychiatric : alert, oriented to time, place, person. Normal mood and affect         Assessment & Plan:  Obstructive sleep apnea He was diagnosed with severe OSA in 2006.  He had a  Lab study and his RDI was 56. He was very symptomatic with snoring, gasping, choking, unrefreshed sleep, frequent awakenings, fatigue, hypersomnia. He was  started on cpap 9 cm water.   He had issues falling asleep with cpap. Too much pressure. He ended up using it for couple months only. He tried an oral device from [place], [place]. He has had it for 7 years. Initially was working well but through the years, the oral device is lost its luster.  Patient has  gained 30 pounds through the years. He also tries to sleep on his side. Despite that, his symptoms have gotten worse.   Has snoring, witnessed apneas, gasping, choking, has frequent awakenings at night.  He does Adult nurse estate. Gets sleepy driving.   Feels unrefreshed in am.  (-) abnormal behavior in sleep.   Plan :   We discussed about the diagnosis of Obstructive Sleep Apnea (OSA) and implications of untreated OSA. We discussed about CPAP and  BiPaP as possible treatment options.    We will schedule the patient for a sleep study. Plan for home sleep study. Most likely moderate-severe. He had issues with CPAP pressure in the past. We'll try lower CPAP pressures and increase accordingly. Had issues with a mass. We'll need a mask fitting session. May benefit from nasal pillows or nasal mask. Patient also has an oral device from a dentist in [place]. That's an alternative Rx for OSA.    Patient was instructed to call the office if he/she has not heard back from the office 1-2 weeks after the sleep study.   Patient was instructed to call the office if he/she is having issues with the PAP device.   We discussed good sleep hygiene.   Patient was advised not to engage in activities requiring concentration and/or vigilance if he/she is sleepy.  Patient was advised not to drive if he/she is sleepy.        Thank you very much for letting me participate in this patient's care. Please do not hesitate to give me a call if you have any questions or concerns regarding the treatment plan.   Patient will follow up with me in 8-10 weeks    J. Shirl Harris, MD 12/14/2016   5:15  PM [facility] [place] Pager: [phone] Office: [phone], Fax: [phone]

## 2016-12-14 NOTE — Assessment & Plan Note (Signed)
He was diagnosed with severe OSA in 2006.  He had a  Lab study and his RDI was 56. He was very symptomatic with snoring, gasping, choking, unrefreshed sleep, frequent awakenings, fatigue, hypersomnia. He was started on cpap 9 cm water.   He had issues falling asleep with cpap. Too much pressure. He ended up using it for couple months only. He tried an oral device from [place], [place]. He has had it for 7 years. Initially was working well but through the years, the oral device is lost its luster.  Patient has  gained 30 pounds through the years. He also tries to sleep on his side. Despite that, his symptoms have gotten worse.   Has snoring, witnessed apneas, gasping, choking, has frequent awakenings at night.  He does Adult nurse estate. Gets sleepy driving.   Feels unrefreshed in am.  (-) abnormal behavior in sleep.   Plan :   We discussed about the diagnosis of Obstructive Sleep Apnea (OSA) and implications of untreated OSA. We discussed about CPAP and BiPaP as possible treatment options.    We will schedule the patient for a sleep study. Plan for home sleep study. Most likely moderate-severe. He had issues with CPAP pressure in the past. We'll try lower CPAP pressures and increase accordingly. Had issues with a mass. We'll need a mask fitting session. May benefit from nasal pillows or nasal mask. Patient also has an oral device from a dentist in [place]. That's an alternative Rx for OSA.    Patient was instructed to call the office if he/she has not heard back from the office 1-2 weeks after the sleep study.   Patient was instructed to call the office if he/she is having issues with the PAP device.   We discussed good sleep hygiene.   Patient was advised not to engage in activities requiring concentration and/or vigilance if he/she is sleepy.  Patient was advised not to drive if he/she is sleepy.

## 2017-01-23 ENCOUNTER — Telehealth: Payer: Self-pay | Admitting: Pulmonary Disease

## 2017-01-23 DIAGNOSIS — G4733 Obstructive sleep apnea (adult) (pediatric): Secondary | ICD-10-CM

## 2017-01-23 NOTE — Telephone Encounter (Signed)
lmomtcb x1 

## 2017-01-24 NOTE — Telephone Encounter (Signed)
lmomtcb x 2  

## 2017-01-24 NOTE — Telephone Encounter (Signed)
Pt states that he has difficulty wearing cpap machine- has difficulty getting comfortable with mask on, difficulty breathing with air pressure blowing through mask. Pt was to have a HST per last office visit but pt declines at this time d/t cost of procedure- pt states "I already know I have sleep apnea, I don't need another sleep study to confirm this".  Pt is wanting to know if anything further can be recommended for him.  AD please advise.  Thanks!

## 2017-01-24 NOTE — Telephone Encounter (Signed)
Pt returning call and can be reached @ [phone].Nathaniel [name]

## 2017-01-25 NOTE — Telephone Encounter (Signed)
   I spoke with pt. He has a cpap machine obtained in 2006.  He hardly used it.  Can we hook with up with a DME company and have the machine checked and see if it still works? thanks  J. Shirl Harris, MD 01/25/2017, 7:11 AM [facility] Pager [phone] After 3 pm or if no answer, call [phone]

## 2017-01-26 NOTE — Telephone Encounter (Signed)
Spoke with pt, he could not remember who his dme was in the past, and he states he believes they are no longer in business. He is aware the order is being placed for his current machine to be evaluated to see if it can be fixed.  As of now, nothing further is needed

## 2017-01-27 NOTE — Telephone Encounter (Signed)
Staff message has been sent to me and AD will await AD's decision

## 2017-02-14 ENCOUNTER — Telehealth: Payer: Self-pay | Admitting: Pulmonary Disease

## 2017-02-14 NOTE — Telephone Encounter (Signed)
Spoke with pt, aware of TP's recs.  Nothing further needed at this time.

## 2017-02-14 NOTE — Telephone Encounter (Signed)
Most likely no . Can cause nasal stuffiness Would use saline nasal rinses and get As needed   If not better will need ov  Please contact office for sooner follow up if symptoms do not improve or worsen or seek emergency care

## 2017-02-14 NOTE — Telephone Encounter (Signed)
Spoke with pt, who states he has been using cpap machine for 3 week. Pt states he has fluid build up on his right up and nasal/chest congestion that developed 3-4d ago. Pt is wanting to be sure that the cpap machine is not causing the above symptoms. Pt is requesting recommendations.  TP please advise, as it is so late in the afternoon. Thanks.

## 2017-02-16 ENCOUNTER — Ambulatory Visit: Payer: BLUE CROSS/BLUE SHIELD | Admitting: Pulmonary Disease

## 2017-02-16 ENCOUNTER — Telehealth: Payer: Self-pay | Admitting: Pulmonary Disease

## 2017-02-16 DIAGNOSIS — G4733 Obstructive sleep apnea (adult) (pediatric): Secondary | ICD-10-CM

## 2017-02-16 NOTE — Telephone Encounter (Signed)
Spoke with pt and advised that order was sent to [facility] for cpap mask and supplies.  Pt verbalized understanding.  Nothing further needed

## 2017-04-24 ENCOUNTER — Other Ambulatory Visit (INDEPENDENT_AMBULATORY_CARE_PROVIDER_SITE_OTHER): Payer: BLUE CROSS/BLUE SHIELD

## 2017-04-24 ENCOUNTER — Other Ambulatory Visit: Payer: Self-pay | Admitting: Internal Medicine

## 2017-04-24 ENCOUNTER — Encounter: Payer: Self-pay | Admitting: Internal Medicine

## 2017-04-24 ENCOUNTER — Ambulatory Visit (INDEPENDENT_AMBULATORY_CARE_PROVIDER_SITE_OTHER): Payer: BLUE CROSS/BLUE SHIELD | Admitting: Internal Medicine

## 2017-04-24 DIAGNOSIS — R1030 Lower abdominal pain, unspecified: Secondary | ICD-10-CM | POA: Diagnosis not present

## 2017-04-24 DIAGNOSIS — F411 Generalized anxiety disorder: Secondary | ICD-10-CM

## 2017-04-24 DIAGNOSIS — R109 Unspecified abdominal pain: Secondary | ICD-10-CM | POA: Insufficient documentation

## 2017-04-24 LAB — CBC WITH DIFFERENTIAL/PLATELET
Basophils Absolute: 0 10*3/uL (ref 0.0–0.1)
Basophils Relative: 0.2 % (ref 0.0–3.0)
EOS ABS: 0.1 10*3/uL (ref 0.0–0.7)
Eosinophils Relative: 0.3 % (ref 0.0–5.0)
HCT: 44.7 % (ref 39.0–52.0)
HEMOGLOBIN: 15.2 g/dL (ref 13.0–17.0)
Lymphocytes Relative: 7.2 % — ABNORMAL LOW (ref 12.0–46.0)
Lymphs Abs: 1.2 10*3/uL (ref 0.7–4.0)
MCHC: 33.9 g/dL (ref 30.0–36.0)
MCV: 89.1 fl (ref 78.0–100.0)
MONO ABS: 1.3 10*3/uL — AB (ref 0.1–1.0)
Monocytes Relative: 7.8 % (ref 3.0–12.0)
NEUTROS PCT: 84.5 % — AB (ref 43.0–77.0)
Neutro Abs: 14.3 10*3/uL — ABNORMAL HIGH (ref 1.4–7.7)
Platelets: 262 10*3/uL (ref 150.0–400.0)
RBC: 5.02 Mil/uL (ref 4.22–5.81)
RDW: 13.3 % (ref 11.5–15.5)
WBC: 16.9 10*3/uL — AB (ref 4.0–10.5)

## 2017-04-24 LAB — HEPATIC FUNCTION PANEL
ALBUMIN: 4.3 g/dL (ref 3.5–5.2)
ALT: 22 U/L (ref 0–53)
AST: 16 U/L (ref 0–37)
Alkaline Phosphatase: 69 U/L (ref 39–117)
Bilirubin, Direct: 0.2 mg/dL (ref 0.0–0.3)
Total Bilirubin: 0.7 mg/dL (ref 0.2–1.2)
Total Protein: 7.2 g/dL (ref 6.0–8.3)

## 2017-04-24 LAB — BASIC METABOLIC PANEL
BUN: 10 mg/dL (ref 6–23)
CHLORIDE: 99 meq/L (ref 96–112)
CO2: 24 mEq/L (ref 19–32)
Calcium: 9.3 mg/dL (ref 8.4–10.5)
Creatinine, Ser: 0.92 mg/dL (ref 0.40–1.50)
GFR: 91.35 mL/min (ref >60.00)
GLUCOSE: 116 mg/dL — AB (ref 70–99)
POTASSIUM: 3.4 meq/L — AB (ref 3.5–5.1)
Sodium: 133 mEq/L — ABNORMAL LOW (ref 135–145)

## 2017-04-24 LAB — URINALYSIS
BILIRUBIN URINE: NEGATIVE
HGB URINE DIPSTICK: NEGATIVE
KETONES UR: 40 — AB
LEUKOCYTES UA: NEGATIVE
NITRITE: NEGATIVE
Specific Gravity, Urine: <=1.005 — AB (ref 1.000–1.030)
TOTAL PROTEIN, URINE-UPE24: NEGATIVE
Urine Glucose: NEGATIVE
Urobilinogen, UA: 0.2 (ref 0.0–1.0)
pH: 6.5 (ref 5.0–8.0)

## 2017-04-24 LAB — SEDIMENTATION RATE: SED RATE: 55 mm/h — AB (ref 0–20)

## 2017-04-24 MED ORDER — CIPROFLOXACIN HCL 500 MG PO TABS: 500.0000 mg | ORAL_TABLET | Freq: Two times a day (BID) | ORAL | 0 refills | Status: DC

## 2017-04-24 MED ORDER — ALPRAZOLAM 0.5 MG PO TABS: 0.5000 mg | ORAL_TABLET | Freq: Two times a day (BID) | ORAL | 0 refills | Status: DC | PRN

## 2017-04-24 NOTE — Assessment & Plan Note (Addendum)
?  diverticulitis vs other. Colon 2015 Labs Cipro CT if not better in 2-3 d or if abn labs

## 2017-04-24 NOTE — Patient Instructions (Signed)

## 2017-04-24 NOTE — Progress Notes (Signed)
Subjective:  Patient ID: Nathaniel [name], male    DOB: 1965/11/04  Age: 51 y.o. MRN: [id number]  CC: No chief complaint on file.   HPI Nigel Ericsson Purdie III presents for a lower abd pain in the lower abd pain since Thursday, worse on the weekend 6-7/10. C/o frequent urination since Fri  Outpatient Medications Prior to Visit  Medication Sig Dispense Refill  . aspirin EC 81 MG tablet Take 1 tablet (81 mg total) by mouth daily. 90 tablet 11  . losartan (COZAAR) 50 MG tablet Take 1 tablet (50 mg total) by mouth daily. 90 tablet 3  . sildenafil (VIAGRA) 100 MG tablet Take 0.5-1 tablets (50-100 mg total) by mouth daily as needed for erectile dysfunction. 5 tablet 11   No facility-administered medications prior to visit.     ROS Review of Systems  Constitutional: Negative for appetite change, fatigue and unexpected weight change.  HENT: Negative for congestion, nosebleeds, sneezing, sore throat and trouble swallowing.   Eyes: Negative for itching and visual disturbance.  Respiratory: Negative for cough.   Cardiovascular: Negative for chest pain, palpitations and leg swelling.  Gastrointestinal: Positive for abdominal pain. Negative for abdominal distention, blood in stool, diarrhea and nausea.  Genitourinary: Positive for frequency and urgency. Negative for hematuria.  Musculoskeletal: Negative for back pain, gait problem, joint swelling and neck pain.  Skin: Negative for rash.  Neurological: Negative for dizziness, tremors, speech difficulty and weakness.  Psychiatric/Behavioral: Negative for agitation, dysphoric mood, sleep disturbance and suicidal ideas. The patient is not nervous/anxious.     Objective:  BP 118/80 (BP Location: Left Arm, Patient Position: Sitting, Cuff Size: Large)   Pulse 93   Temp 98.5 F (36.9 C) (Oral)   Ht 5\' 8"  (1.727 m)   Wt 202 lb (91.6 kg)   SpO2 99%   BMI 30.71 kg/m   BP Readings from Last 3 Encounters:  04/24/17 118/80  12/14/16 122/74  10/05/16  128/74    Wt Readings from Last 3 Encounters:  04/24/17 202 lb (91.6 kg)  12/14/16 214 lb 6.4 oz (97.3 kg)  10/05/16 212 lb (96.2 kg)    Physical Exam  Constitutional: He is oriented to person, place, and time. He appears well-developed. No distress.  NAD  HENT:  Mouth/Throat: Oropharynx is clear and moist.  Eyes: Pupils are equal, round, and reactive to light. Conjunctivae are normal.  Neck: Normal range of motion. No JVD present. No thyromegaly present.  Cardiovascular: Normal rate, regular rhythm, normal heart sounds and intact distal pulses.  Exam reveals no gallop and no friction rub.   No murmur heard. Pulmonary/Chest: Effort normal and breath sounds normal. No respiratory distress. He has no wheezes. He has no rales. He exhibits no tenderness.  Abdominal: Soft. Bowel sounds are normal. He exhibits no distension and no mass. There is no tenderness. There is no rebound and no guarding.  Musculoskeletal: Normal range of motion. He exhibits no edema or tenderness.  Lymphadenopathy:    He has no cervical adenopathy.  Neurological: He is alert and oriented to person, place, and time. He has normal reflexes. No cranial nerve deficit. He exhibits normal muscle tone. He displays a negative Romberg sign. Coordination abnormal. Gait normal.  Skin: Skin is warm and dry. No rash noted.  Psychiatric: He has a normal mood and affect. His behavior is normal. Judgment and thought content normal.  Lower abd is tender L>R. No mass or rebound  Lab Results  Component Value Date  WBC 8.0 10/05/2016   HGB 16.3 10/05/2016   HCT 47.0 10/05/2016   PLT 273.0 10/05/2016   GLUCOSE 96 10/05/2016   CHOL 174 10/05/2016   TRIG 232.0 (H) 10/05/2016   HDL 34.50 (L) 10/05/2016   LDLDIRECT 110.0 10/05/2016   LDLCALC 87 03/25/2015   ALT 30 10/05/2016   AST 19 10/05/2016   NA 138 10/05/2016   K 4.1 10/05/2016   CL 104 10/05/2016   CREATININE 0.90 10/05/2016   BUN 11 10/05/2016   CO2 27 10/05/2016    TSH 3.22 10/05/2016   PSA 0.49 10/05/2016   HGBA1C 5.2 12/23/2011    No results found.  Assessment & Plan:   There are no diagnoses linked to this encounter. I am having [name] maintain his sildenafil, aspirin EC, and losartan.  No orders of the defined types were placed in this encounter.    Follow-up: No Follow-up on file.  Walker Kehr, MD

## 2017-04-24 NOTE — Assessment & Plan Note (Addendum)
Worse he is getting separated Xanax prn  Potential benefits of a long term benzodiazepines  use as well as potential risks  and complications were explained to the patient and were aknowledged. Dr Jenny Reichmann in 2 weeks

## 2017-04-25 ENCOUNTER — Telehealth: Payer: Self-pay | Admitting: *Deleted

## 2017-04-25 DIAGNOSIS — R1030 Lower abdominal pain, unspecified: Secondary | ICD-10-CM

## 2017-04-25 NOTE — Telephone Encounter (Signed)
Per Cecille Rubin pt is in her office. Pt was originally supposed to go to [facility] for his abdominal CT, but pt want to go to [facility] because it will be cheaper w/ghis insurance. [facility] is requesting new order. Updated new order...Nathaniel [name]

## 2017-04-26 ENCOUNTER — Encounter (HOSPITAL_COMMUNITY): Payer: Self-pay | Admitting: Emergency Medicine

## 2017-04-26 ENCOUNTER — Ambulatory Visit
Admission: RE | Admit: 2017-04-26 | Discharge: 2017-04-26 | Disposition: A | Discharge status: Home / Self Care | Payer: BLUE CROSS/BLUE SHIELD | Source: Ambulatory Visit | Attending: Internal Medicine | Admitting: Internal Medicine

## 2017-04-26 ENCOUNTER — Inpatient Hospital Stay (HOSPITAL_COMMUNITY)
Admission: EM | Admit: 2017-04-26 | Discharge: 2017-04-30 | DRG: 392 | Disposition: A | Discharge status: Home / Self Care | Payer: BLUE CROSS/BLUE SHIELD | Attending: Internal Medicine | Admitting: Internal Medicine

## 2017-04-26 ENCOUNTER — Inpatient Hospital Stay: Admission: RE | Admit: 2017-04-26 | Payer: BLUE CROSS/BLUE SHIELD | Source: Ambulatory Visit

## 2017-04-26 DIAGNOSIS — K5720 Diverticulitis of large intestine with perforation and abscess without bleeding: Principal | ICD-10-CM | POA: Diagnosis present

## 2017-04-26 DIAGNOSIS — G4733 Obstructive sleep apnea (adult) (pediatric): Secondary | ICD-10-CM | POA: Diagnosis present

## 2017-04-26 DIAGNOSIS — Z8042 Family history of malignant neoplasm of prostate: Secondary | ICD-10-CM

## 2017-04-26 DIAGNOSIS — N2882 Megaloureter: Secondary | ICD-10-CM | POA: Diagnosis present

## 2017-04-26 DIAGNOSIS — N529 Male erectile dysfunction, unspecified: Secondary | ICD-10-CM | POA: Diagnosis present

## 2017-04-26 DIAGNOSIS — I10 Essential (primary) hypertension: Secondary | ICD-10-CM | POA: Diagnosis present

## 2017-04-26 DIAGNOSIS — E785 Hyperlipidemia, unspecified: Secondary | ICD-10-CM | POA: Diagnosis present

## 2017-04-26 DIAGNOSIS — K219 Gastro-esophageal reflux disease without esophagitis: Secondary | ICD-10-CM | POA: Diagnosis present

## 2017-04-26 DIAGNOSIS — K5732 Diverticulitis of large intestine without perforation or abscess without bleeding: Secondary | ICD-10-CM

## 2017-04-26 DIAGNOSIS — R1030 Lower abdominal pain, unspecified: Secondary | ICD-10-CM

## 2017-04-26 DIAGNOSIS — G4700 Insomnia, unspecified: Secondary | ICD-10-CM | POA: Diagnosis present

## 2017-04-26 HISTORY — DX: Essential (primary) hypertension: I10

## 2017-04-26 LAB — CBC
HEMATOCRIT: 42.7 % (ref 39.0–52.0)
Hemoglobin: 14.4 g/dL (ref 13.0–17.0)
MCH: 29.3 pg (ref 26.0–34.0)
MCHC: 33.7 g/dL (ref 30.0–36.0)
MCV: 87 fL (ref 78.0–100.0)
PLATELETS: 307 10*3/uL (ref 150–400)
RBC: 4.91 MIL/uL (ref 4.22–5.81)
RDW: 13.2 % (ref 11.5–15.5)
WBC: 13.7 10*3/uL — ABNORMAL HIGH (ref 4.0–10.5)

## 2017-04-26 LAB — COMPREHENSIVE METABOLIC PANEL
ALT: 44 U/L (ref 17–63)
AST: 31 U/L (ref 15–41)
Albumin: 3.4 g/dL — ABNORMAL LOW (ref 3.5–5.0)
Alkaline Phosphatase: 70 U/L (ref 38–126)
Anion gap: 8 (ref 5–15)
BUN: 8 mg/dL (ref 6–20)
CHLORIDE: 102 mmol/L (ref 101–111)
CO2: 25 mmol/L (ref 22–32)
CREATININE: 0.91 mg/dL (ref 0.61–1.24)
Calcium: 8.9 mg/dL (ref 8.9–10.3)
GFR calc non Af Amer: >60 mL/min (ref >60)
Glucose, Bld: 94 mg/dL (ref 65–99)
POTASSIUM: 3.4 mmol/L — AB (ref 3.5–5.1)
SODIUM: 135 mmol/L (ref 135–145)
Total Bilirubin: 0.7 mg/dL (ref 0.3–1.2)
Total Protein: 6.9 g/dL (ref 6.5–8.1)

## 2017-04-26 LAB — URINALYSIS, ROUTINE W REFLEX MICROSCOPIC
Bilirubin Urine: NEGATIVE
GLUCOSE, UA: NEGATIVE mg/dL
HGB URINE DIPSTICK: NEGATIVE
Ketones, ur: 5 mg/dL — AB
Leukocytes, UA: NEGATIVE
Nitrite: NEGATIVE
PH: 7 (ref 5.0–8.0)
PROTEIN: NEGATIVE mg/dL
Specific Gravity, Urine: 1.02 (ref 1.005–1.030)

## 2017-04-26 LAB — LIPASE, BLOOD: LIPASE: 25 U/L (ref 11–51)

## 2017-04-26 MED ORDER — HYDRALAZINE HCL 20 MG/ML IJ SOLN: 10.0000 mg | INTRAMUSCULAR | Status: DC | PRN

## 2017-04-26 MED ORDER — ONDANSETRON HCL 4 MG/2ML IJ SOLN: 4.0000 mg | Freq: Four times a day (QID) | INTRAMUSCULAR | Status: DC | PRN

## 2017-04-26 MED ORDER — MORPHINE SULFATE (PF) 4 MG/ML IV SOLN
1.0000 mg | INTRAVENOUS | Status: DC | PRN
  Administered 2017-04-27: 1 mg via INTRAVENOUS
  Filled 2017-04-26: qty 1

## 2017-04-26 MED ORDER — CIPROFLOXACIN IN D5W 400 MG/200ML IV SOLN
400.0000 mg | Freq: Once | INTRAVENOUS | Status: AC
  Administered 2017-04-26: 400 mg via INTRAVENOUS
  Filled 2017-04-26: qty 200

## 2017-04-26 MED ORDER — KCL IN DEXTROSE-NACL 20-5-0.9 MEQ/L-%-% IV SOLN
INTRAVENOUS | Status: DC
  Administered 2017-04-27 – 2017-04-29 (×4): via INTRAVENOUS
  Filled 2017-04-26 (×6): qty 1000

## 2017-04-26 MED ORDER — METRONIDAZOLE IN NACL 5-0.79 MG/ML-% IV SOLN
500.0000 mg | Freq: Once | INTRAVENOUS | Status: AC
  Administered 2017-04-26: 500 mg via INTRAVENOUS
  Filled 2017-04-26: qty 100

## 2017-04-26 MED ORDER — FLUTICASONE PROPIONATE 50 MCG/ACT NA SUSP
2.0000 | Freq: Every day | NASAL | Status: DC
  Administered 2017-04-27 – 2017-04-30 (×4): 2 via NASAL
  Filled 2017-04-26: qty 16

## 2017-04-26 MED ORDER — IOPAMIDOL (ISOVUE-300) INJECTION 61%
100.0000 mL | Freq: Once | INTRAVENOUS | Status: AC | PRN
  Administered 2017-04-26: 100 mL via INTRAVENOUS

## 2017-04-26 MED ORDER — ACETAMINOPHEN 650 MG RE SUPP: 650.0000 mg | Freq: Four times a day (QID) | RECTAL | Status: DC | PRN

## 2017-04-26 MED ORDER — ACETAMINOPHEN 325 MG PO TABS
650.0000 mg | ORAL_TABLET | Freq: Four times a day (QID) | ORAL | Status: DC | PRN
  Administered 2017-04-27 (×2): 650 mg via ORAL
  Filled 2017-04-26 (×3): qty 2

## 2017-04-26 MED ORDER — ONDANSETRON HCL 4 MG PO TABS: 4.0000 mg | ORAL_TABLET | Freq: Four times a day (QID) | ORAL | Status: DC | PRN

## 2017-04-26 MED ORDER — METRONIDAZOLE IN NACL 5-0.79 MG/ML-% IV SOLN
500.0000 mg | Freq: Three times a day (TID) | INTRAVENOUS | Status: DC
  Administered 2017-04-27 – 2017-04-29 (×7): 500 mg via INTRAVENOUS
  Filled 2017-04-26 (×8): qty 100

## 2017-04-26 NOTE — ED Provider Notes (Signed)
[facility] DEPT Provider Note   CSN: [id number] Arrival date & time: 04/26/17  1938     History   Chief Complaint Chief Complaint  Patient presents with  . Abdominal Pain    HPI Nathaniel [name] is a 51 y.o. male.  HPI   51 year old male presenting with abdominal pain. Patient sent here from PCP for admission. Patient's been having left lower quadrant, pain. Started on Cipro. Not improving. Patient taken 4 doses of Cipro. Patient outpatient CAT scan. CAT scan showed sigmoid diverticulitis with colonic perforation with a small pericolonic abscess with air and fluid in the pericolonic soft tissues  Past Medical History:  Diagnosis Date  . ADD 05/03/2007  . ALLERGIC RHINITIS 11/25/2007  . ANXIETY 05/03/2007  . CHEST PAIN 07/28/2010  . 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 GERD 07/31/2010  . HYPERLIPIDEMIA 08/01/2007  . Hypertension   . INSOMNIA 10/17/2007  . Memory loss 11/23/2007  . PSA, INCREASED 01/06/2010  . SLEEP APNEA, OBSTRUCTIVE, SEVERE 05/03/2007   no cpap use    Patient Active Problem List   Diagnosis Date Noted  . Abdominal pain 04/24/2017  . Obesity 10/05/2016  . Right otitis media 07/30/2015  . Essential hypertension 03/25/2015  . Acute bronchitis 08/12/2014  . Elevated blood pressure reading without diagnosis of hypertension 12/21/2013  . Encounter for well adult exam with abnormal findings 09/12/2011  . GERD 07/31/2010  . 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 PSA, INCREASED 01/06/2010  . ALLERGIC RHINITIS 11/25/2007  . INSOMNIA 10/17/2007  . Hyperlipidemia 08/01/2007  . Anxiety state 05/03/2007  . ADD 05/03/2007  . Obstructive sleep apnea 05/03/2007    Past Surgical History:  Procedure Laterality Date  . NASAL SINUS SURGERY     dr Janace Hoard  . TONSILLECTOMY         Home Medications    Prior to Admission medications   Medication Sig Start Date End Date Taking? Authorizing Provider  ALPRAZolam Duanne Moron) 0.5 MG tablet Take 1 tablet  (0.5 mg total) by mouth 2 (two) times daily as needed for anxiety. 04/24/17   Plotnikov, Evie Lacks, MD  aspirin EC 81 MG tablet Take 1 tablet (81 mg total) by mouth daily. 03/25/15   Biagio Borg, MD  ciprofloxacin (CIPRO) 500 MG tablet Take 1 tablet (500 mg total) by mouth 2 (two) times daily. 04/24/17 05/08/17  Plotnikov, Evie Lacks, MD  losartan (COZAAR) 50 MG tablet Take 1 tablet (50 mg total) by mouth daily. 10/05/16   Biagio Borg, MD  sildenafil (VIAGRA) 100 MG tablet Take 0.5-1 tablets (50-100 mg total) by mouth daily as needed for erectile dysfunction. 04/16/12 05/16/12  Biagio Borg, MD    Family History Family History  Problem Relation Age of Onset  . Cancer Maternal Grandfather        prostate cancer  . Liver disease Sister   . Colon cancer Neg Hx   . Rectal cancer Neg Hx   . Stomach cancer Neg Hx     Social History Social History  Substance Use Topics  . Smoking status: Never Smoker  . Smokeless tobacco: Never Used  . Alcohol use Yes     Comment: socially on weekends     Allergies   Amoxicillin-pot clavulanate   Review of Systems Review of Systems  Constitutional: Positive for activity change, appetite change and fatigue. Negative for fever.  Gastrointestinal: Positive for abdominal pain. Negative for diarrhea, nausea and vomiting.  All other systems reviewed and are negative.  Physical Exam Updated Vital Signs BP (!) 141/92   Pulse 91   Temp 98.7 F (37.1 C) (Oral)   Resp 18   SpO2 97%   Physical Exam  Constitutional: He is oriented to person, place, and time. He appears well-nourished.  HENT:  Head: Normocephalic.  Eyes: Conjunctivae are normal.  Cardiovascular: Normal rate.   Pulmonary/Chest: Effort normal and breath sounds normal. No respiratory distress.  Abdominal: Soft.  Mild tenderness to palpation  Neurological: He is oriented to person, place, and time.  Skin: Skin is warm and dry. He is not diaphoretic.  Psychiatric: He has a normal mood  and affect. His behavior is normal.     ED Treatments / Results  Labs (all labs ordered are listed, but only abnormal results are displayed) Labs Reviewed  COMPREHENSIVE METABOLIC PANEL - Abnormal; Notable for the following:       Result Value   Potassium 3.4 (*)    Albumin 3.4 (*)    All other components within normal limits  CBC - Abnormal; Notable for the following:    WBC 13.7 (*)    All other components within normal limits  URINALYSIS, ROUTINE W REFLEX MICROSCOPIC - Abnormal; Notable for the following:    Ketones, ur 5 (*)    All other components within normal limits  LIPASE, BLOOD    EKG  EKG Interpretation None       Radiology Ct Abdomen Pelvis W Contrast  Result Date: 04/26/2017 CLINICAL DATA:  Lower abdominal pain for 5 days.  Weight loss. EXAM: CT ABDOMEN AND PELVIS WITH CONTRAST TECHNIQUE: Multidetector CT imaging of the abdomen and pelvis was performed using the standard protocol following bolus administration of intravenous contrast. CONTRAST:  189mL ISOVUE-300 IOPAMIDOL (ISOVUE-300) INJECTION 61% COMPARISON:  None. FINDINGS: Lower chest: Normal. Hepatobiliary: Several tiny cysts in the liver. Liver parenchyma is otherwise normal. Biliary tree appears normal. Pancreas: Unremarkable. No pancreatic ductal dilatation or surrounding inflammatory changes. Spleen: Normal in size without focal abnormality. Adrenals/Urinary Tract: The adrenal glands and kidneys appear normal. The distal right ureter is dilated but there is no evidence of a stone or mass in the ureter or bladder. 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 There is no delayed excretion of contrast from the right kidney and the proximal ureter is not dilated. Stomach/Bowel: There is a focal: Perforation in the mid sigmoid region air in the mesenteric in as well as a small amount of fluid adjacent to the air consistent with a small diverticular  abscess. There is marked edema of the mucosa in the area of inflammation. There are rare diverticula in the colon. The appendix and terminal ileum appear normal. Vascular/Lymphatic: No significant vascular findings are present. No enlarged abdominal or pelvic lymph nodes. Reproductive: Prostate is unremarkable. Other: No abdominal wall hernia or abnormality. No abdominopelvic ascites. Musculoskeletal: No acute abnormality. Severe spinal stenosis at L4-5. IMPRESSION: 1. Acute sigmoid diverticulitis with colonic perforation with a small pericolonic abscess with air and fluid in the pericolonic soft tissues. 2. Focal dilatation of the distal right ureter without evidence of a stone. Correlation with urinalysis is suggested. 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 I cannot exclude a distal ureteral mass. Electronically Signed   By: Lorriane Shire M.D.   On: 04/26/2017 14:41    Procedures Procedures (including critical care time)  Medications Ordered in ED Medications  ciprofloxacin (CIPRO) IVPB 400 mg (not administered)  metroNIDAZOLE (FLAGYL) IVPB 500 mg (not  administered)     Initial Impression / Assessment and Plan / ED Course  I have reviewed the triage vital signs and the nursing notes.  Pertinent labs & imaging results that were available during my care of the patient were reviewed by me and considered in my medical decision making (see chart for details).     51 year old male presenting with abdominal pain. Patient sent here from PCP for admission. Patient's been having left lower quadrant, pain. Started on Cipro. Not improving. Patient taken 4 doses of Cipro. Patient outpatient CAT scan. CAT scan showed sigmoid diverticulitis with colonic perforation with a small pericolonic abscess with air and fluid in the pericolonic soft tissues  We'll discuss with surgery.  Surgery will follow, they recc admit to medicne for IV abx.   Final Clinical Impressions(s) / ED Diagnoses   Final  diagnoses:  None    New Prescriptions New Prescriptions   No medications on file     Macarthur Critchley, MD 04/26/17 2321

## 2017-04-26 NOTE — Consult Note (Signed)
Reason for Consult:Diverticulitis with peridiverticular abscess Referring Physician: [place]  Nathaniel [name] is an 51 y.o. male.  HPI: Has been having abdominal pain for several days and was being treated for diverticulitis with Cipro and flagyl by PCP.  Did not improve, then had CT abd/pelvis which showed a peridiverticular abscessin the sigmoid colon region, not large, probably not accessible to percutaneous drainage,and probably too small for drainage.  Past Medical History:  Diagnosis Date  . ADD 05/03/2007  . ALLERGIC RHINITIS 11/25/2007  . ANXIETY 05/03/2007  . CHEST PAIN 07/28/2010  . 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 GERD 07/31/2010  . HYPERLIPIDEMIA 08/01/2007  . Hypertension   . INSOMNIA 10/17/2007  . Memory loss 11/23/2007  . PSA, INCREASED 01/06/2010  . SLEEP APNEA, OBSTRUCTIVE, SEVERE 05/03/2007   no cpap use    Past Surgical History:  Procedure Laterality Date  . NASAL SINUS SURGERY     dr byers  . TONSILLECTOMY      Family History  Problem Relation Age of Onset  . Cancer Maternal Grandfather        prostate cancer  . Liver disease Sister   . Colon cancer Neg Hx   . Rectal cancer Neg Hx   . Stomach cancer Neg Hx     Social History:  reports that he has never smoked. He has never used smokeless tobacco. He reports that he drinks alcohol. He reports that he does not use drugs.  Allergies:  Allergies  Allergen Reactions  . Amoxicillin-Pot Clavulanate Nausea And Vomiting    REACTION: nausea/GI upset    Medications: I have reviewed the patient's current medications.  Results for orders placed or performed during the hospital encounter of 04/26/17 (from the past 48 hour(s))  Lipase, blood     Status: None   Collection Time: 04/26/17  8:12 PM  Result Value Ref Range   Lipase 25 11 - 51 U/L  Comprehensive metabolic panel     Status: Abnormal   Collection Time: 04/26/17  8:12 PM  Result Value Ref Range   Sodium 135 135 - 145 mmol/L   Potassium 3.4  (L) 3.5 - 5.1 mmol/L   Chloride 102 101 - 111 mmol/L   CO2 25 22 - 32 mmol/L   Glucose, Bld 94 65 - 99 mg/dL   BUN 8 6 - 20 mg/dL   Creatinine, Ser 0.91 0.61 - 1.24 mg/dL   Calcium 8.9 8.9 - 10.3 mg/dL   Total Protein 6.9 6.5 - 8.1 g/dL   Albumin 3.4 (L) 3.5 - 5.0 g/dL   AST 31 15 - 41 U/L   ALT 44 17 - 63 U/L   Alkaline Phosphatase 70 38 - 126 U/L   Total Bilirubin 0.7 0.3 - 1.2 mg/dL   GFR calc non Af Amer >60 >60 mL/min   GFR calc Af Amer >60 >60 mL/min    Comment: (NOTE) The eGFR has been calculated using the CKD EPI equation. This calculation has not been validated in all clinical situations. eGFR's persistently <60 mL/min signify possible Chronic Kidney Disease.    Anion gap 8 5 - 15  CBC     Status: Abnormal   Collection Time: 04/26/17  8:12 PM  Result Value Ref Range   WBC 13.7 (H) 4.0 - 10.5 K/uL   RBC 4.91 4.22 - 5.81 MIL/uL   Hemoglobin 14.4 13.0 - 17.0 g/dL   HCT 42.7 39.0 - 52.0 %   MCV 87.0 78.0 - 100.0 fL   MCH 29.3   26.0 - 34.0 pg   MCHC 33.7 30.0 - 36.0 g/dL   RDW 13.2 11.5 - 15.5 %   Platelets 307 150 - 400 K/uL  Urinalysis, Routine w reflex microscopic     Status: Abnormal   Collection Time: 04/26/17  8:15 PM  Result Value Ref Range   Color, Urine YELLOW YELLOW   APPearance CLEAR CLEAR   Specific Gravity, Urine 1.020 1.005 - 1.030   pH 7.0 5.0 - 8.0   Glucose, UA NEGATIVE NEGATIVE mg/dL   Hgb urine dipstick NEGATIVE NEGATIVE   Bilirubin Urine NEGATIVE NEGATIVE   Ketones, ur 5 (A) NEGATIVE mg/dL   Protein, ur NEGATIVE NEGATIVE mg/dL   Nitrite NEGATIVE NEGATIVE   Leukocytes, UA NEGATIVE NEGATIVE    Ct Abdomen Pelvis W Contrast  Result Date: 04/26/2017 CLINICAL DATA:  Lower abdominal pain for 5 days.  Weight loss. EXAM: CT ABDOMEN AND PELVIS WITH CONTRAST TECHNIQUE: Multidetector CT imaging of the abdomen and pelvis was performed using the standard protocol following bolus administration of intravenous contrast. CONTRAST:  125m ISOVUE-300 IOPAMIDOL  (ISOVUE-300) INJECTION 61% COMPARISON:  None. FINDINGS: Lower chest: Normal. Hepatobiliary: Several tiny cysts in the liver. Liver parenchyma is otherwise normal. Biliary tree appears normal. Pancreas: Unremarkable. No pancreatic ductal dilatation or surrounding inflammatory changes. Spleen: Normal in size without focal abnormality. Adrenals/Urinary Tract: The adrenal glands and kidneys appear normal. The distal right ureter is dilated but there is no evidence of a stone or mass in the ureter or bladder. 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 There is no delayed excretion of contrast from the right kidney and the proximal ureter is not dilated. Stomach/Bowel: There is a focal: Perforation in the mid sigmoid region air in the mesenteric in as well as a small amount of fluid adjacent to the air consistent with a small diverticular abscess. There is marked edema of the mucosa in the area of inflammation. There are rare diverticula in the colon. The appendix and terminal ileum appear normal. Vascular/Lymphatic: No significant vascular findings are present. No enlarged abdominal or pelvic lymph nodes. Reproductive: Prostate is unremarkable. Other: No abdominal wall hernia or abnormality. No abdominopelvic ascites. Musculoskeletal: No acute abnormality. Severe spinal stenosis at L4-5. IMPRESSION: 1. Acute sigmoid diverticulitis with colonic perforation with a small pericolonic abscess with air and fluid in the pericolonic soft tissues. 2. Focal dilatation of the distal right ureter without evidence of a stone. Correlation with urinalysis is suggested. 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 I cannot exclude a distal ureteral mass. Electronically Signed   By: JLorriane ShireM.D.   On: 04/26/2017 14:41    Review of Systems  Constitutional: Negative for chills and fever.  Gastrointestinal: Positive for abdominal pain and diarrhea. Negative for  nausea and vomiting.  All other systems reviewed and are negative.  Blood pressure (!) 132/92, pulse 93, temperature 98.7 F (37.1 C), temperature source Oral, resp. rate 18, SpO2 95 %. Physical Exam  Vitals reviewed. Constitutional: He is oriented to person, place, and time. He appears well-developed and well-nourished.  HENT:  Head: Normocephalic and atraumatic.  Eyes: Pupils are equal, round, and reactive to light. Conjunctivae and EOM are normal.  Neck: Normal range of motion. Neck supple.  Cardiovascular: Normal rate, regular rhythm, normal heart sounds and intact distal pulses.   Respiratory: Effort normal and breath sounds normal.  GI: Soft. Normal appearance and bowel sounds are normal. There is no hepatosplenomegaly. There is tenderness in  the periumbilical area and left upper quadrant. There is no rigidity, no rebound, no guarding and no CVA tenderness.    Musculoskeletal: Normal range of motion.  Neurological: He is alert and oriented to person, place, and time. He has normal reflexes.  Skin: Skin is warm and dry.  Psychiatric: He has a normal mood and affect. His behavior is normal. Judgment and thought content normal.    Assessment/Plan: Diverticulitis with abscess, not  Large IV antibiotics NPO except for ice chips\ Bowel rest No NGT or percutaneous drainage  JAMES WYATT 04/26/2017, 10:17 PM     

## 2017-04-26 NOTE — ED Notes (Signed)
ED Provider at bedside. 

## 2017-04-26 NOTE — ED Triage Notes (Addendum)
Reports mid lower abd cramping and diarrhea that started over the weekend, seemed to get a little better, and then got worse.  Seen at PCP office on Monday and started on antibiotic.  WBC was elevated so PCP ordered outpatient CT scan.  PCP called this evening and told him results of CT (pt not sure what CT showed) and to come to ED for IV antibiotics if not feeling any better.    CT results--1. Acute sigmoid diverticulitis with colonic perforation with a small pericolonic abscess with air and fluid in the pericolonic soft tissues. 2. Focal dilatation of the distal right ureter without evidence of a stone. Correlation with urinalysis is suggested. 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 I cannot exclude a distal ureteral mass.

## 2017-04-26 NOTE — Progress Notes (Signed)
Pharmacy Antibiotic Note  Nathaniel [name] is a 51 y.o. male admitted on 04/26/2017 with peridiverticular abscess.  Pharmacy has been consulted for Cipro dosing.  Plan: Cipro 400mg  IV Q12H.   Temp (24hrs), Avg:98.7 F (37.1 C), Min:98.7 F (37.1 C), Max:98.7 F (37.1 C)   Recent Labs Lab 04/24/17 1433 04/26/17 2012  WBC 16.9* 13.7*  CREATININE 0.92 0.91    Estimated Creatinine Clearance: 103.2 mL/min (by C-G formula based on SCr of 0.91 mg/dL).    Allergies  Allergen Reactions  . Amoxicillin-Pot Clavulanate Nausea And Vomiting    REACTION: nausea/GI upset    Thank you for allowing pharmacy to be a part of this patient's care.  Wynona Neat, PharmD, BCPS  04/26/2017 11:26 PM

## 2017-04-26 NOTE — H&P (Signed)
History and Physical    SANCHEZ HEMMER [name] BMW:[id number] DOB: 1966-03-14 DOA: 04/26/2017  PCP: Biagio Borg, MD  Patient coming from: Home.  Chief Complaint: Abdominal pain.  HPI: Nathaniel [name] is a 51 y.o. male with history of hypertension presents with the ER with complaints of abdominal pain. Patient has been having abdominal pain mostly in the infraumbilical and left lower quadrant colostomy days. Patient had gone to his PCP 3 days ago and was prescribed Cipro and Flagyl. Despite taking the patient's pain worsened. Has been having diarrhea denies any nausea vomiting.  ED Course: In the ER on exam patient has significant tenderness on the left lower quadrant and infraumbilical. CT of the abdomen and pelvis shows sigmoid diverticulitis with perforation and possible abscess formation. Dr. Hulen Skains on call general surgeon has been consulted and patient is being admitted for further management.  Review of Systems: As per HPI, rest all negative.   Past Medical History:  Diagnosis Date  . ADD 05/03/2007  . ALLERGIC RHINITIS 11/25/2007  . ANXIETY 05/03/2007  . CHEST PAIN 07/28/2010  . 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 GERD 07/31/2010  . HYPERLIPIDEMIA 08/01/2007  . Hypertension   . INSOMNIA 10/17/2007  . Memory loss 11/23/2007  . PSA, INCREASED 01/06/2010  . SLEEP APNEA, OBSTRUCTIVE, SEVERE 05/03/2007   no cpap use    Past Surgical History:  Procedure Laterality Date  . NASAL SINUS SURGERY     dr Janace Hoard  . TONSILLECTOMY       reports that he has never smoked. He has never used smokeless tobacco. He reports that he drinks alcohol. He reports that he does not use drugs.  Allergies  Allergen Reactions  . Amoxicillin-Pot Clavulanate Nausea And Vomiting    REACTION: nausea/GI upset    Family History  Problem Relation Age of Onset  . Cancer Maternal Grandfather        prostate cancer  . Liver disease Sister   . Colon cancer Neg Hx   . Rectal cancer Neg Hx   . Stomach  cancer Neg Hx     Prior to Admission medications   Medication Sig Start Date End Date Taking? Authorizing Provider  ciprofloxacin (CIPRO) 500 MG tablet Take 1 tablet (500 mg total) by mouth 2 (two) times daily. 04/24/17 05/08/17 Yes Plotnikov, Evie Lacks, MD  fluticasone (FLONASE) 50 MCG/ACT nasal spray Place 2 sprays into both nostrils daily.   Yes [provider]  losartan (COZAAR) 50 MG tablet Take 1 tablet (50 mg total) by mouth daily. 10/05/16  Yes Biagio Borg, MD  ALPRAZolam Duanne Moron) 0.5 MG tablet Take 1 tablet (0.5 mg total) by mouth 2 (two) times daily as needed for anxiety. 04/24/17   Plotnikov, Evie Lacks, MD  aspirin EC 81 MG tablet Take 1 tablet (81 mg total) by mouth daily. Patient not taking: Reported on 04/26/2017 03/25/15   Biagio Borg, MD  sildenafil (VIAGRA) 100 MG tablet Take 0.5-1 tablets (50-100 mg total) by mouth daily as needed for erectile dysfunction. Patient not taking: Reported on 04/26/2017 04/16/12 05/16/12  Biagio Borg, MD    Physical Exam: Vitals:   04/26/17 2130 04/26/17 2145 04/26/17 2200 04/26/17 2215  BP: (!) 139/95 113/89 (!) 132/92 135/90  Pulse: 92 90 93 95  Resp:      Temp:      TempSrc:      SpO2: 99% 99% 95% 99%      Constitutional: Moderately built and nourished. Vitals:  04/26/17 2130 04/26/17 2145 04/26/17 2200 04/26/17 2215  BP: (!) 139/95 113/89 (!) 132/92 135/90  Pulse: 92 90 93 95  Resp:      Temp:      TempSrc:      SpO2: 99% 99% 95% 99%   Eyes: Anicteric no pallor. ENMT: No discharge from the ears eyes nose and mouth. Neck: No mass felt. No JVD appreciated. Respiratory: No rhonchi or crepitations. Cardiovascular: S1-S2 heard no murmurs appreciated. Abdomen: Left lower quadrant tenderness. Infraumbilical tenderness. Musculoskeletal: No edema. No joint effusion. Skin: No rash. Skin appears warm. Neurologic: Alert awake oriented to time place and person. Moves all extremities. Psychiatric: Appears normal. Normal  affect.   Labs on Admission: I have personally reviewed following labs and imaging studies  CBC:  Recent Labs Lab 04/24/17 1433 04/26/17 2012  WBC 16.9* 13.7*  NEUTROABS 14.3*  --   HGB 15.2 14.4  HCT 44.7 42.7  MCV 89.1 87.0  PLT 262.0 683   Basic Metabolic Panel:  Recent Labs Lab 04/24/17 1433 04/26/17 2012  NA 133* 135  K 3.4* 3.4*  CL 99 102  CO2 24 25  GLUCOSE 116* 94  BUN 10 8  CREATININE 0.92 0.91  CALCIUM 9.3 8.9   GFR: Estimated Creatinine Clearance: 103.2 mL/min (by C-G formula based on SCr of 0.91 mg/dL). Liver Function Tests:  Recent Labs Lab 04/24/17 1433 04/26/17 2012  AST 16 31  ALT 22 44  ALKPHOS 69 70  BILITOT 0.7 0.7  PROT 7.2 6.9  ALBUMIN 4.3 3.4*    Recent Labs Lab 04/26/17 2012  LIPASE 25   No results for input(s): AMMONIA in the last 168 hours. Coagulation Profile: No results for input(s): INR, PROTIME in the last 168 hours. Cardiac Enzymes: No results for input(s): CKTOTAL, CKMB, CKMBINDEX, TROPONINI in the last 168 hours. BNP (last 3 results) No results for input(s): PROBNP in the last 8760 hours. HbA1C: No results for input(s): HGBA1C in the last 72 hours. CBG: No results for input(s): GLUCAP in the last 168 hours. Lipid Profile: No results for input(s): CHOL, HDL, LDLCALC, TRIG, CHOLHDL, LDLDIRECT in the last 72 hours. Thyroid Function Tests: No results for input(s): TSH, T4TOTAL, FREET4, T3FREE, THYROIDAB in the last 72 hours. Anemia Panel: No results for input(s): VITAMINB12, FOLATE, FERRITIN, TIBC, IRON, RETICCTPCT in the last 72 hours. Urine analysis:    Component Value Date/Time   COLORURINE YELLOW 04/26/2017 2015   APPEARANCEUR CLEAR 04/26/2017 2015   LABSPEC 1.020 04/26/2017 2015   PHURINE 7.0 04/26/2017 2015   GLUCOSEU NEGATIVE 04/26/2017 2015   GLUCOSEU NEGATIVE 04/24/2017 1433   HGBUR NEGATIVE 04/26/2017 2015   [place] 04/26/2017 2015   KETONESUR 5 (A) 04/26/2017 2015   PROTEINUR  NEGATIVE 04/26/2017 2015   UROBILINOGEN 0.2 04/24/2017 1433   NITRITE NEGATIVE 04/26/2017 2015   LEUKOCYTESUR NEGATIVE 04/26/2017 2015   Sepsis Labs: @LABRCNTIP (procalcitonin:4,lacticidven:4) )No results found for this or any previous visit (from the past 240 hour(s)).   Radiological Exams on Admission: Ct Abdomen Pelvis W Contrast  Result Date: 04/26/2017 CLINICAL DATA:  Lower abdominal pain for 5 days.  Weight loss. EXAM: CT ABDOMEN AND PELVIS WITH CONTRAST TECHNIQUE: Multidetector CT imaging of the abdomen and pelvis was performed using the standard protocol following bolus administration of intravenous contrast. CONTRAST:  128mL ISOVUE-300 IOPAMIDOL (ISOVUE-300) INJECTION 61% COMPARISON:  None. FINDINGS: Lower chest: Normal. Hepatobiliary: Several tiny cysts in the liver. Liver parenchyma is otherwise normal. Biliary tree appears normal. Pancreas: Unremarkable. No pancreatic ductal  dilatation or surrounding inflammatory changes. Spleen: Normal in size without focal abnormality. Adrenals/Urinary Tract: The adrenal glands and kidneys appear normal. The distal right ureter is dilated but there is no evidence of a stone or mass in the ureter or bladder. 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 There is no delayed excretion of contrast from the right kidney and the proximal ureter is not dilated. Stomach/Bowel: There is a focal: Perforation in the mid sigmoid region air in the mesenteric in as well as a small amount of fluid adjacent to the air consistent with a small diverticular abscess. There is marked edema of the mucosa in the area of inflammation. There are rare diverticula in the colon. The appendix and terminal ileum appear normal. Vascular/Lymphatic: No significant vascular findings are present. No enlarged abdominal or pelvic lymph nodes. Reproductive: Prostate is unremarkable. Other: No abdominal wall hernia or abnormality. No  abdominopelvic ascites. Musculoskeletal: No acute abnormality. Severe spinal stenosis at L4-5. IMPRESSION: 1. Acute sigmoid diverticulitis with colonic perforation with a small pericolonic abscess with air and fluid in the pericolonic soft tissues. 2. Focal dilatation of the distal right ureter without evidence of a stone. Correlation with urinalysis is suggested. 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 I cannot exclude a distal ureteral mass. Electronically Signed   By: Lorriane Shire M.D.   On: 04/26/2017 14:41     Assessment/Plan Principal Problem:   Abscess of sigmoid colon due to diverticulitis Active Problems:   Obstructive sleep apnea   Essential hypertension   Perforation of sigmoid colon due to diverticulitis    1. Sigmoid diverticulitis with perforation and abscess formation - discussed with Dr. Dema Severin on call surgeon. Appreciate general surgery consult. Patient will be placed on Cipro and Flagyl nothing by mouth and IV fluids and pain medications. 2. Hypertension - when necessary IV hydralazine for now since patient is nothing by mouth. 3. Obstructive sleep apnea unable to tolerate CPAP. 4. Right ureter dilatation - will need further workup as outpatient.  I have discussed with on-call general surgeon. Reviewed old charts and labs.   DVT prophylaxis: SCDs. Code Status: Full code.  Family Communication: Discussed with patient.  Disposition Plan: Home.  Consults called: General surgery.  Admission status: Inpatient.    Rise Patience MD [facility] Pager [phone].  If 7PM-7AM, please contact night-coverage [email or web address] Password [facility]  04/26/2017, 11:09 PM

## 2017-04-26 NOTE — ED Notes (Signed)
Pt ambulated to the restroom.

## 2017-04-27 LAB — BASIC METABOLIC PANEL
Anion gap: 8 (ref 5–15)
BUN: 5 mg/dL — ABNORMAL LOW (ref 6–20)
CHLORIDE: 104 mmol/L (ref 101–111)
CO2: 26 mmol/L (ref 22–32)
Calcium: 8.7 mg/dL — ABNORMAL LOW (ref 8.9–10.3)
Creatinine, Ser: 0.87 mg/dL (ref 0.61–1.24)
GFR calc non Af Amer: >60 mL/min (ref >60)
Glucose, Bld: 114 mg/dL — ABNORMAL HIGH (ref 65–99)
POTASSIUM: 3.5 mmol/L (ref 3.5–5.1)
SODIUM: 138 mmol/L (ref 135–145)

## 2017-04-27 LAB — CBC
HEMATOCRIT: 40.5 % (ref 39.0–52.0)
HEMOGLOBIN: 14.2 g/dL (ref 13.0–17.0)
MCH: 30 pg (ref 26.0–34.0)
MCHC: 35.1 g/dL (ref 30.0–36.0)
MCV: 85.6 fL (ref 78.0–100.0)
PLATELETS: 266 10*3/uL (ref 150–400)
RBC: 4.73 MIL/uL (ref 4.22–5.81)
RDW: 12.8 % (ref 11.5–15.5)
WBC: 13.1 10*3/uL — AB (ref 4.0–10.5)

## 2017-04-27 LAB — HIV ANTIBODY (ROUTINE TESTING W REFLEX): HIV Screen 4th Generation wRfx: NONREACTIVE

## 2017-04-27 MED ORDER — CIPROFLOXACIN IN D5W 400 MG/200ML IV SOLN
400.0000 mg | Freq: Two times a day (BID) | INTRAVENOUS | Status: DC
  Administered 2017-04-27 – 2017-04-28 (×4): 400 mg via INTRAVENOUS
  Filled 2017-04-27 (×5): qty 200

## 2017-04-27 MED ORDER — ENOXAPARIN SODIUM 60 MG/0.6ML ~~LOC~~ SOLN
45.0000 mg | SUBCUTANEOUS | Status: DC
  Administered 2017-04-27 – 2017-04-29 (×3): 45 mg via SUBCUTANEOUS
  Filled 2017-04-27 (×3): qty 0.6

## 2017-04-27 NOTE — Progress Notes (Signed)
CC: Abdominal pain  Subjective: Doing well this a.m. He says the pain is a little bit better. Pain is normally just below his belly button. No other complaints. He does note his only reaction to Augmentin was some nausea. This occurred when he was a child.  Objective: Vital signs in last 24 hours: Temp:  [98.7 F (37.1 C)-98.8 F (37.1 C)] 98.8 F (37.1 C) (07/26 0436) Pulse Rate:  [87-96] 87 (07/26 0436) Resp:  [18] 18 (07/26 0436) BP: (113-152)/(77-98) 130/77 (07/26 0436) SpO2:  [93 %-99 %] 97 % (07/26 0436) Weight:  [91.6 kg (201 lb 15.1 oz)] 91.6 kg (201 lb 15.1 oz) (07/26 0803) Last BM Date: 04/26/17  Intake/Output from previous day: 07/25 0701 - 07/26 0700 In: 671.7 [I.V.:571.7; IV Piggyback:100] Out: 220 [Urine:220] Intake/Output this shift: No intake/output data recorded.  General appearance: alert, cooperative and no distress Resp: clear to auscultation bilaterally Gi:.Soft, tender mid abdominal region but better than on admission., Few bowel sounds.  Lab Results:   Recent Labs  04/26/17 2012 04/27/17 0510  WBC 13.7* 13.1*  HGB 14.4 14.2  HCT 42.7 40.5  PLT 307 266    BMET  Recent Labs  04/26/17 2012 04/27/17 0510  NA 135 138  K 3.4* 3.5  CL 102 104  CO2 25 26  GLUCOSE 94 114*  BUN 8 5*  CREATININE 0.91 0.87  CALCIUM 8.9 8.7*   PT/INR No results for input(s): LABPROT, INR in the last 72 hours.   Recent Labs Lab 04/24/17 1433 04/26/17 2012  AST 16 31  ALT 22 44  ALKPHOS 69 70  BILITOT 0.7 0.7  PROT 7.2 6.9  ALBUMIN 4.3 3.4*     Lipase     Component Value Date/Time   LIPASE 25 04/26/2017 2012     Prior to Admission medications   Medication Sig Start Date End Date Taking? Authorizing Provider  ciprofloxacin (CIPRO) 500 MG tablet Take 1 tablet (500 mg total) by mouth 2 (two) times daily. 04/24/17 05/08/17 Yes Plotnikov, Evie Lacks, MD  fluticasone (FLONASE) 50 MCG/ACT nasal spray Place 2 sprays into both nostrils daily.   Yes  [provider]  losartan (COZAAR) 50 MG tablet Take 1 tablet (50 mg total) by mouth daily. 10/05/16  Yes Biagio Borg, MD  ALPRAZolam Duanne Moron) 0.5 MG tablet Take 1 tablet (0.5 mg total) by mouth 2 (two) times daily as needed for anxiety. 04/24/17   Plotnikov, Evie Lacks, MD  aspirin EC 81 MG tablet Take 1 tablet (81 mg total) by mouth daily. Patient not taking: Reported on 04/26/2017 03/25/15   Biagio Borg, MD  sildenafil (VIAGRA) 100 MG tablet Take 0.5-1 tablets (50-100 mg total) by mouth daily as needed for erectile dysfunction. Patient not taking: Reported on 04/26/2017 04/16/12 05/16/12  Biagio Borg, MD    Medications: . fluticasone  2 spray Each Nare Daily   . dextrose 5 % and 0.9 % NaCl with KCl 20 mEq/L 100 mL/hr at 04/27/17 0038  . metronidazole 500 mg (04/27/17 0645)   Anti-infectives    Start     Dose/Rate Route Frequency Ordered Stop   04/27/17 0600  metroNIDAZOLE (FLAGYL) IVPB 500 mg     500 mg 100 mL/hr over 60 Minutes Intravenous Every 8 hours 04/26/17 2309     04/26/17 2130  ciprofloxacin (CIPRO) IVPB 400 mg     400 mg 200 mL/hr over 60 Minutes Intravenous  Once 04/26/17 2123 04/26/17 2317   04/26/17 2130  metroNIDAZOLE (FLAGYL) IVPB 500 mg     500 mg 100 mL/hr over 60 Minutes Intravenous  Once 04/26/17 2123 04/26/17 2317      Assessment/Plan  Acute sigmoid diverticulitis with colonic perforation and small abscess Focal dilatation right ureter without evidence of stone Hypertension Severe obstructive sleep apnea - no CPAP GERD Hyperlipidemia Erectile dysfunction. Body mass index is 30.7 FEN:  IV fluids/NPO ID: IV Flagyl/Cipro 04/26/17 =>> day 2 DVT:  SCD  Plan continue IV Cipro and Flagyl.  Medical management currently.   LOS: 1 day    Betul Brisky 04/27/2017 [phone]

## 2017-04-27 NOTE — Progress Notes (Signed)
[facility] PROGRESS NOTE  Nathaniel [name] CXK:[id number] DOB: 1965/10/06 DOA: 04/26/2017  PCP: Biagio Borg, MD  Brief History/Interval Summary: 51 year old Caucasian male with a past medical history of hypertension who presented to the emergency department with complaints of worsening abdominal pain. The pain started about 3 days prior to admission and he initially went to his primary care physician. He was thought to have diverticulitis and was prescribed ciprofloxacin. However, patient's symptoms did not improve, so he decided to come into the emergency department. CT scan was done which showed sigmoid diverticulitis with perforation and possible small abscess formation. General surgery was consulted and the patient was hospitalized.  Reason for Visit: Acute sigmoid diverticulitis with small abscess and perforation  Consultants: Gen. surgery  Procedures: None  Antibiotics: Ciprofloxacin and Flagyl  Subjective/Interval History: Patient still with some abdominal discomfort in the lower abdomen, 5 out of 10 in intensity. Denies any nausea, vomiting. Has had a small loose stool this morning.  ROS: Denies any shortness of breath  Objective:  Vital Signs  Vitals:   04/26/17 2215 04/27/17 0015 04/27/17 0436 04/27/17 0803  BP: 135/90 (!) 152/93 130/77   Pulse: 95 93 87   Resp:  18 18   Temp:  98.8 F (37.1 C) 98.8 F (37.1 C)   TempSrc:  Oral Oral   SpO2: 99% 93% 97%   Weight:    91.6 kg (201 lb 15.1 oz)  Height:  5\' 8"  (1.727 m)      Intake/Output Summary (Last 24 hours) at 04/27/17 1120 Last data filed at 04/27/17 0900  Gross per 24 hour  Intake           671.67 ml  Output              220 ml  Net           451.67 ml   Filed Weights   04/27/17 0803  Weight: 91.6 kg (201 lb 15.1 oz)    General appearance: alert, cooperative, appears stated age and no distress Resp: clear to auscultation bilaterally Cardio: regular rate and rhythm, S1, S2 normal, no  murmur, click, rub or gallop GI: Abdomen is soft. Tender in the lower abdomen in the left lower quadrant as well as in the infra umbilical region. No rebound, rigidity or guarding. No masses or organomegaly. Bowel sounds are present. Extremities: extremities normal, atraumatic, no cyanosis or edema Neurologic: No focal neurological deficits noted.  Lab Results:  Data Reviewed: I have personally reviewed following labs and imaging studies  CBC:  Recent Labs Lab 04/24/17 1433 04/26/17 2012 04/27/17 0510  WBC 16.9* 13.7* 13.1*  NEUTROABS 14.3*  --   --   HGB 15.2 14.4 14.2  HCT 44.7 42.7 40.5  MCV 89.1 87.0 85.6  PLT 262.0 307 970    Basic Metabolic Panel:  Recent Labs Lab 04/24/17 1433 04/26/17 2012 04/27/17 0510  NA 133* 135 138  K 3.4* 3.4* 3.5  CL 99 102 104  CO2 24 25 26   GLUCOSE 116* 94 114*  BUN 10 8 5*  CREATININE 0.92 0.91 0.87  CALCIUM 9.3 8.9 8.7*    GFR: Estimated Creatinine Clearance: 107.9 mL/min (by C-G formula based on SCr of 0.87 mg/dL).  Liver Function Tests:  Recent Labs Lab 04/24/17 1433 04/26/17 2012  AST 16 31  ALT 22 44  ALKPHOS 69 70  BILITOT 0.7 0.7  PROT 7.2 6.9  ALBUMIN 4.3 3.4*     Recent Labs Lab 04/26/17  2012  LIPASE 25     Radiology Studies: Ct Abdomen Pelvis W Contrast  Result Date: 04/26/2017 CLINICAL DATA:  Lower abdominal pain for 5 days.  Weight loss. EXAM: CT ABDOMEN AND PELVIS WITH CONTRAST TECHNIQUE: Multidetector CT imaging of the abdomen and pelvis was performed using the standard protocol following bolus administration of intravenous contrast. CONTRAST:  127mL ISOVUE-300 IOPAMIDOL (ISOVUE-300) INJECTION 61% COMPARISON:  None. FINDINGS: Lower chest: Normal. Hepatobiliary: Several tiny cysts in the liver. Liver parenchyma is otherwise normal. Biliary tree appears normal. Pancreas: Unremarkable. No pancreatic ductal dilatation or surrounding inflammatory changes. Spleen: Normal in size without focal abnormality.  Adrenals/Urinary Tract: The adrenal glands and kidneys appear normal. The distal right ureter is dilated but there is no evidence of a stone or mass in the ureter or bladder. 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 There is no delayed excretion of contrast from the right kidney and the proximal ureter is not dilated. Stomach/Bowel: There is a focal: Perforation in the mid sigmoid region air in the mesenteric in as well as a small amount of fluid adjacent to the air consistent with a small diverticular abscess. There is marked edema of the mucosa in the area of inflammation. There are rare diverticula in the colon. The appendix and terminal ileum appear normal. Vascular/Lymphatic: No significant vascular findings are present. No enlarged abdominal or pelvic lymph nodes. Reproductive: Prostate is unremarkable. Other: No abdominal wall hernia or abnormality. No abdominopelvic ascites. Musculoskeletal: No acute abnormality. Severe spinal stenosis at L4-5. IMPRESSION: 1. Acute sigmoid diverticulitis with colonic perforation with a small pericolonic abscess with air and fluid in the pericolonic soft tissues. 2. Focal dilatation of the distal right ureter without evidence of a stone. Correlation with urinalysis is suggested. 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 I cannot exclude a distal ureteral mass. Electronically Signed   By: Lorriane Shire M.D.   On: 04/26/2017 14:41     Medications:  Scheduled: . enoxaparin (LOVENOX) injection  45 mg Subcutaneous Q24H  . fluticasone  2 spray Each Nare Daily   Continuous: . ciprofloxacin    . dextrose 5 % and 0.9 % NaCl with KCl 20 mEq/L 100 mL/hr at 04/27/17 1027  . metronidazole 500 mg (04/27/17 0645)   FXT:[id number] **OR** acetaminophen, hydrALAZINE, morphine injection, ondansetron **OR** ondansetron (ZOFRAN) IV  Assessment/Plan:  Principal Problem:   Abscess of sigmoid colon due to  diverticulitis Active Problems:   Obstructive sleep apnea   Essential hypertension   Perforation of sigmoid colon due to diverticulitis    Acute sigmoid diverticulitis with perforation and small abscess formation. General surgery has been consulted. No plan for surgery at this time. Patient is on IV antibiotics. Pain medications. IV fluids. Nothing by mouth.  Right ureter dilatation. Seen incidentally on CT scan. Could be congenital. No previous imaging studies in our system for comparison. No stones noted. Patient denies any history of nephrolithiasis. No blood noted in UA. Creatinine is normal. This was discussed with the patient. He will need outpatient urological consultation.  History of essential hypertension. Continue to monitor blood pressure. IV hydralazine as needed.  History of obstructive sleep apnea. Has been unable to tolerate CPAP. Continue to monitor.  DVT Prophylaxis: Lovenox    Code Status: Full code  Family Communication: Discussed with the patient  Disposition Plan: Management as outlined above.    LOS: 1 day   [facility] Pager [phone] 04/27/2017, 11:20 AM  If  7PM-7AM, please contact night-coverage at [email or web address], password [facility]

## 2017-04-27 NOTE — Progress Notes (Signed)
Initial Nutrition Assessment  DOCUMENTATION CODES:   Not applicable  INTERVENTION:  1. Advance diet as tolerated 2. Cater to pt preferences  NUTRITION DIAGNOSIS:   Inadequate oral intake related to inability to eat as evidenced by NPO status.  GOAL:   Patient will meet greater than or equal to 90% of their needs  MONITOR:   I & O's, Labs, Weight trends, Diet advancement  REASON FOR ASSESSMENT:   Malnutrition Screening Tool    ASSESSMENT:   51 yo male h/o HTN, OSA, presents with sigmoid diverticulitis, perf, and abscess formation  Spoke with Nathaniel [name] at bedside. He reports losing 10# over the past 3-4 months, he states he was told to change his eating habits and began to eat less. He did not intend to lose weight with initial diverticulitis diagnosis, but has tried to maintain weight loss since then. Wt loss is insignificant for time frame. He is hungry right now, asking for food. No acute complaints. No nausea/vomiting. No needs from a nutrition perspective UOP 219mL last 12 hrs Nutrition-Focused physical exam completed. Findings are no fat depletion, no muscle depletion, and no edema.  Medications reviewed and include:  D5 1/2 NS KCL 101mEq at 130mL/hr --> 408 calories Labs reviewed    Diet Order:  Diet NPO time specified  Skin:  Reviewed, no issues  Last BM:  04/26/2017  Height:   Ht Readings from Last 1 Encounters:  04/27/17 5\' 8"  (1.727 m)    Weight:   Wt Readings from Last 1 Encounters:  04/27/17 201 lb 15.1 oz (91.6 kg)    Ideal Body Weight:  70 kg  BMI:  Body mass index is 30.71 kg/m.  Estimated Nutritional Needs:   Kcal:  1900-2100 calories (MSJ x1.1-1.2)  Protein:  118-137 grams (1.3-1.5g/kg)  Fluid:  1.9-2.1L  EDUCATION NEEDS:   No education needs identified at this time  Satira Anis. Millie Forde, MS, RD LDN Inpatient Clinical Dietitian Pager [phone]

## 2017-04-27 NOTE — Progress Notes (Signed)
ANTICOAGULATION CONSULT NOTE - Initial Consult  Pharmacy Consult for Lovenox Indication: VTE prophylaxis  Allergies  Allergen Reactions  . Amoxicillin-Pot Clavulanate Nausea And Vomiting    REACTION: nausea/GI upset  Patient Measurements: Height: 5\' 8"  (172.7 cm) Weight: 201 lb 15.1 oz (91.6 kg) IBW/kg (Calculated) : 68.4 Vital Signs: Temp: 98.8 F (37.1 C) (07/26 0436) Temp Source: Oral (07/26 0436) BP: 130/77 (07/26 0436) Pulse Rate: 87 (07/26 0436) Labs:  Recent Labs  04/24/17 1433 04/26/17 2012 04/27/17 0510  HGB 15.2 14.4 14.2  HCT 44.7 42.7 40.5  PLT 262.0 307 266  CREATININE 0.92 0.91 0.87    Estimated Creatinine Clearance: 107.9 mL/min (by C-G formula based on SCr of 0.87 mg/dL).   Medical History: Past Medical History:  Diagnosis Date  . ADD 05/03/2007  . ALLERGIC RHINITIS 11/25/2007  . ANXIETY 05/03/2007  . CHEST PAIN 07/28/2010  . 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 GERD 07/31/2010  . HYPERLIPIDEMIA 08/01/2007  . Hypertension   . INSOMNIA 10/17/2007  . Memory loss 11/23/2007  . PSA, INCREASED 01/06/2010  . SLEEP APNEA, OBSTRUCTIVE, SEVERE 05/03/2007   no cpap use   Assessment: 51 year old male admitted with peri-diverticular abscess too small for drainage to start Lovenox for DVT prophylaxis per pharmacy consult today.   CBC is within normal limits.  SCr is 0.87- stable. Normalized CrCl ~100 mL/min.   Goal of Therapy:  Monitor platelets by anticoagulation protocol: Yes   Plan:  Lovenox 45 mg SQ daily (0.5mg /kg daily for BMI >30).  Monitor CBC and SCr.  Pharmacy will sign off Lovenox consult as no adjustment anticipated but will continue to follow for Cipro consult.   Sloan Leiter, PharmD, BCPS Clinical Pharmacist Clinical Phone 04/27/2017 until 3:30 PM - [phone] After hours, please call #[phone] 04/27/2017,10:28 AM

## 2017-04-28 LAB — CBC
HEMATOCRIT: 39.4 % (ref 39.0–52.0)
HEMOGLOBIN: 13.7 g/dL (ref 13.0–17.0)
MCH: 29.8 pg (ref 26.0–34.0)
MCHC: 34.8 g/dL (ref 30.0–36.0)
MCV: 85.8 fL (ref 78.0–100.0)
Platelets: 280 10*3/uL (ref 150–400)
RBC: 4.59 MIL/uL (ref 4.22–5.81)
RDW: 12.8 % (ref 11.5–15.5)
WBC: 6.9 10*3/uL (ref 4.0–10.5)

## 2017-04-28 LAB — BASIC METABOLIC PANEL
ANION GAP: 5 (ref 5–15)
BUN: 6 mg/dL (ref 6–20)
CHLORIDE: 109 mmol/L (ref 101–111)
CO2: 26 mmol/L (ref 22–32)
Calcium: 8.7 mg/dL — ABNORMAL LOW (ref 8.9–10.3)
Creatinine, Ser: 0.86 mg/dL (ref 0.61–1.24)
GFR calc non Af Amer: >60 mL/min (ref >60)
GLUCOSE: 113 mg/dL — AB (ref 65–99)
POTASSIUM: 3.7 mmol/L (ref 3.5–5.1)
Sodium: 140 mmol/L (ref 135–145)

## 2017-04-28 NOTE — Progress Notes (Signed)
[facility] PROGRESS NOTE  Nathaniel [name] GYJ:[id number] DOB: 08-23-1966 DOA: 04/26/2017  PCP: Biagio Borg, MD  Brief History/Interval Summary: 51 year old Caucasian male with a past medical history of hypertension who presented to the emergency department with complaints of worsening abdominal pain. The pain started about 3 days prior to admission and he initially went to his primary care physician. He was thought to have diverticulitis and was prescribed ciprofloxacin. However, patient's symptoms did not improve, so he decided to come into the emergency department. CT scan was done which showed sigmoid diverticulitis with perforation and possible small abscess formation. General surgery was consulted and the patient was hospitalized.  Reason for Visit: Acute sigmoid diverticulitis with small abscess and perforation  Consultants: Gen. surgery  Procedures: None  Antibiotics: Ciprofloxacin and Flagyl  Subjective/Interval History: Patient feels much better this morning. Abdominal pain is significantly improved. Denies any nausea, vomiting. Continues to have small quantity. Bowel movement. Denies loose stools. Denies any blood in stool.  ROS: Denies any chest pain or shortness of breath  Objective:  Vital Signs  Vitals:   04/27/17 1425 04/27/17 1900 04/28/17 0522 04/28/17 0554  BP: 110/60 131/75 130/90 133/81  Pulse: 76 76 70   Resp: 18 18 18    Temp: 98.8 F (37.1 C) 98.5 F (36.9 C) 97.9 F (36.6 C)   TempSrc: Oral Oral Oral   SpO2: 100% 100% 97%   Weight:      Height:        Intake/Output Summary (Last 24 hours) at 04/28/17 0909 Last data filed at 04/28/17 0507  Gross per 24 hour  Intake          2976.67 ml  Output                0 ml  Net          2976.67 ml   Filed Weights   04/27/17 0803  Weight: 91.6 kg (201 lb 15.1 oz)    General appearance: Awake, alert. In no distress. Resp: Clear to auscultation bilaterally Cardio: S1, S2 is normal, regular.  No S3, S4. No rubs, murmurs, or bruit GI: Abdomen is soft. No tenderness appreciated today. No masses or organomegaly. Bowel sounds are present. Extremities: No edema Neurologic: No focal neurological deficits.  Lab Results:  Data Reviewed: I have personally reviewed following labs and imaging studies  CBC:  Recent Labs Lab 04/24/17 1433 04/26/17 2012 04/27/17 0510 04/28/17 0513  WBC 16.9* 13.7* 13.1* 6.9  NEUTROABS 14.3*  --   --   --   HGB 15.2 14.4 14.2 13.7  HCT 44.7 42.7 40.5 39.4  MCV 89.1 87.0 85.6 85.8  PLT 262.0 307 266 263    Basic Metabolic Panel:  Recent Labs Lab 04/24/17 1433 04/26/17 2012 04/27/17 0510 04/28/17 0513  NA 133* 135 138 140  K 3.4* 3.4* 3.5 3.7  CL 99 102 104 109  CO2 24 25 26 26   GLUCOSE 116* 94 114* 113*  BUN 10 8 5* 6  CREATININE 0.92 0.91 0.87 0.86  CALCIUM 9.3 8.9 8.7* 8.7*    GFR: Estimated Creatinine Clearance: 109.2 mL/min (by C-G formula based on SCr of 0.86 mg/dL).  Liver Function Tests:  Recent Labs Lab 04/24/17 1433 04/26/17 2012  AST 16 31  ALT 22 44  ALKPHOS 69 70  BILITOT 0.7 0.7  PROT 7.2 6.9  ALBUMIN 4.3 3.4*     Recent Labs Lab 04/26/17 2012  LIPASE 25     Radiology  Studies: Ct Abdomen Pelvis W Contrast  Result Date: 04/26/2017 CLINICAL DATA:  Lower abdominal pain for 5 days.  Weight loss. EXAM: CT ABDOMEN AND PELVIS WITH CONTRAST TECHNIQUE: Multidetector CT imaging of the abdomen and pelvis was performed using the standard protocol following bolus administration of intravenous contrast. CONTRAST:  168mL ISOVUE-300 IOPAMIDOL (ISOVUE-300) INJECTION 61% COMPARISON:  None. FINDINGS: Lower chest: Normal. Hepatobiliary: Several tiny cysts in the liver. Liver parenchyma is otherwise normal. Biliary tree appears normal. Pancreas: Unremarkable. No pancreatic ductal dilatation or surrounding inflammatory changes. Spleen: Normal in size without focal abnormality. Adrenals/Urinary Tract: The adrenal glands and  kidneys appear normal. The distal right ureter is dilated but there is no evidence of a stone or mass in the ureter or bladder. 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 There is no delayed excretion of contrast from the right kidney and the proximal ureter is not dilated. Stomach/Bowel: There is a focal: Perforation in the mid sigmoid region air in the mesenteric in as well as a small amount of fluid adjacent to the air consistent with a small diverticular abscess. There is marked edema of the mucosa in the area of inflammation. There are rare diverticula in the colon. The appendix and terminal ileum appear normal. Vascular/Lymphatic: No significant vascular findings are present. No enlarged abdominal or pelvic lymph nodes. Reproductive: Prostate is unremarkable. Other: No abdominal wall hernia or abnormality. No abdominopelvic ascites. Musculoskeletal: No acute abnormality. Severe spinal stenosis at L4-5. IMPRESSION: 1. Acute sigmoid diverticulitis with colonic perforation with a small pericolonic abscess with air and fluid in the pericolonic soft tissues. 2. Focal dilatation of the distal right ureter without evidence of a stone. Correlation with urinalysis is suggested. 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 I cannot exclude a distal ureteral mass. Electronically Signed   By: Lorriane Shire M.D.   On: 04/26/2017 14:41     Medications:  Scheduled: . enoxaparin (LOVENOX) injection  45 mg Subcutaneous Q24H  . fluticasone  2 spray Each Nare Daily   Continuous: . ciprofloxacin Stopped (04/27/17 2217)  . dextrose 5 % and 0.9 % NaCl with KCl 20 mEq/L 100 mL/hr at 04/27/17 1027  . metronidazole Stopped (04/28/17 3810)   FBP:ZWCHENIDPOEUM **OR** acetaminophen, hydrALAZINE, morphine injection, ondansetron **OR** ondansetron (ZOFRAN) IV  Assessment/Plan:  Principal Problem:   Abscess of sigmoid colon due to diverticulitis Active  Problems:   Obstructive sleep apnea   Essential hypertension   Perforation of sigmoid colon due to diverticulitis    Acute sigmoid diverticulitis with perforation and small abscess formation. Patient seems to be improving. WBCs normal. General surgery is following. Defer initiation of diet to general surgery. Continue IV fluids and pain medications. Continue IV antibiotics.   Right ureter dilatation. Seen incidentally on CT scan. Could be congenital. No previous imaging studies in our system for comparison. No stones noted. Patient denies any history of nephrolithiasis. No blood noted in UA. Creatinine is normal. This was discussed with the patient. He will need outpatient urological consultation.  History of essential hypertension. Blood pressure is reasonably well controlled. IV hydralazine as needed.  History of obstructive sleep apnea. Has been unable to tolerate CPAP previously. Continue to monitor.  DVT Prophylaxis: Lovenox    Code Status: Full code  Family Communication: Discussed with the patient  Disposition Plan: Management as outlined above.    LOS: 2 days   [facility] Pager [phone] 04/28/2017, 9:09 AM  If 7PM-7AM, please contact  night-coverage at [email or web address], password [facility]

## 2017-04-28 NOTE — Progress Notes (Signed)
    CC:  Abdominal pain  Subjective: No pain and he feels better.  No complaints up in chair talking with guest.  Objective: Vital signs in last 24 hours: Temp:  [97.9 F (36.6 C)-98.8 F (37.1 C)] 97.9 F (36.6 C) (07/27 0522) Pulse Rate:  [70-76] 70 (07/27 0522) Resp:  [18] 18 (07/27 0522) BP: (110-133)/(60-90) 133/81 (07/27 0554) SpO2:  [97 %-100 %] 97 % (07/27 0522) Last BM Date: 04/27/17 NPO 3000 IV Voided x 2 BM x 1 Afebrile, VSS Labs OK WBC is better No film  Intake/Output from previous day: 07/26 0701 - 07/27 0700 In: 2976.7 [I.V.:2276.7; IV Piggyback:700] Out: -  Intake/Output this shift: No intake/output data recorded.  General appearance: alert, cooperative and no distress GI: soft, non-tender; bowel sounds normal; no masses,  no organomegaly  Lab Results:   Recent Labs  04/27/17 0510 04/28/17 0513  WBC 13.1* 6.9  HGB 14.2 13.7  HCT 40.5 39.4  PLT 266 280    BMET  Recent Labs  04/27/17 0510 04/28/17 0513  NA 138 140  K 3.5 3.7  CL 104 109  CO2 26 26  GLUCOSE 114* 113*  BUN 5* 6  CREATININE 0.87 0.86  CALCIUM 8.7* 8.7*   PT/INR No results for input(s): LABPROT, INR in the last 72 hours.   Recent Labs Lab 04/24/17 1433 04/26/17 2012  AST 16 31  ALT 22 44  ALKPHOS 69 70  BILITOT 0.7 0.7  PROT 7.2 6.9  ALBUMIN 4.3 3.4*     Lipase     Component Value Date/Time   LIPASE 25 04/26/2017 2012     Medications: . enoxaparin (LOVENOX) injection  45 mg Subcutaneous Q24H  . fluticasone  2 spray Each Nare Daily    Assessment/Plan Acute sigmoid diverticulitis with colonic perforation and small abscess Focal dilatation right ureter without evidence of stone Hypertension Severe obstructive sleep apnea - no CPAP GERD Hyperlipidemia Erectile dysfunction. Body mass index is 30.7 FEN:  IV fluids/NPO  =>> start clears ID: IV Flagyl/Cipro 04/26/17 =>> day 3 DVT:  SCD    Plan: continue antibiotics and start clears.   LOS: 2  days    Kimberly Nieland 04/28/2017 [phone]

## 2017-04-29 LAB — CBC
HEMATOCRIT: 43.2 % (ref 39.0–52.0)
Hemoglobin: 14.7 g/dL (ref 13.0–17.0)
MCH: 29.2 pg (ref 26.0–34.0)
MCHC: 34 g/dL (ref 30.0–36.0)
MCV: 85.9 fL (ref 78.0–100.0)
PLATELETS: 317 10*3/uL (ref 150–400)
RBC: 5.03 MIL/uL (ref 4.22–5.81)
RDW: 12.8 % (ref 11.5–15.5)
WBC: 7.5 10*3/uL (ref 4.0–10.5)

## 2017-04-29 LAB — BASIC METABOLIC PANEL
ANION GAP: 7 (ref 5–15)
BUN: <5 mg/dL — ABNORMAL LOW (ref 6–20)
CALCIUM: 8.8 mg/dL — AB (ref 8.9–10.3)
CO2: 24 mmol/L (ref 22–32)
Chloride: 107 mmol/L (ref 101–111)
Creatinine, Ser: 0.89 mg/dL (ref 0.61–1.24)
Glucose, Bld: 108 mg/dL — ABNORMAL HIGH (ref 65–99)
POTASSIUM: 4 mmol/L (ref 3.5–5.1)
Sodium: 138 mmol/L (ref 135–145)

## 2017-04-29 MED ORDER — CIPROFLOXACIN HCL 500 MG PO TABS
500.0000 mg | ORAL_TABLET | Freq: Two times a day (BID) | ORAL | Status: DC
  Administered 2017-04-29 – 2017-04-30 (×3): 500 mg via ORAL
  Filled 2017-04-29 (×3): qty 1

## 2017-04-29 MED ORDER — METRONIDAZOLE 500 MG PO TABS
500.0000 mg | ORAL_TABLET | Freq: Three times a day (TID) | ORAL | Status: DC
  Administered 2017-04-29 – 2017-04-30 (×3): 500 mg via ORAL
  Filled 2017-04-29 (×3): qty 1

## 2017-04-29 NOTE — Progress Notes (Signed)
Pharmacy Antibiotic Note  Nathaniel [name] is a 51 y.o. male admitted on 04/26/2017 with peridiverticular abscess.  Pharmacy has been consulted for Cipro dosing.  Continues on day #4 of abx for peri-diverticular abscess. Not drainable. No cultures. Afebrile, WBC wnl. Advancing diet.  Plan: Change cipro 500mg  to PO BID Continue Flagyl 500mg  to PO TID Monitor clinical picture, renal function F/U LOT   Temp (24hrs), Avg:98.2 F (36.8 C), Min:97.9 F (36.6 C), Max:98.7 F (37.1 C)   Recent Labs Lab 04/24/17 1433 04/26/17 2012 04/27/17 0510 04/28/17 0513 04/29/17 0608  WBC 16.9* 13.7* 13.1* 6.9 7.5  CREATININE 0.92 0.91 0.87 0.86 0.89    Estimated Creatinine Clearance: 105.5 mL/min (by C-G formula based on SCr of 0.89 mg/dL).    Allergies  Allergen Reactions  . Amoxicillin-Pot Clavulanate Nausea And Vomiting    REACTION: nausea/GI upset    Thank you for allowing pharmacy to be a part of this patient's care.  Elenor Quinones, PharmD, BCPS Clinical Pharmacist Pager [phone] 04/29/2017 10:00 AM

## 2017-04-29 NOTE — Progress Notes (Signed)
    CC:  Abdominal pain  Subjective: No pain, tolerated clears  Objective: Vital signs in last 24 hours: Temp:  [97.9 F (36.6 C)-98.7 F (37.1 C)] 97.9 F (36.6 C) (07/28 0645) Pulse Rate:  [74-87] 75 (07/28 0645) Resp:  [18-20] 18 (07/28 0645) BP: (105-133)/(72-82) 125/75 (07/28 0645) SpO2:  [96 %-98 %] 97 % (07/28 0645) Last BM Date: 04/28/17   Intake/Output from previous day: 07/27 0701 - 07/28 0700 In: 3408.3 [P.O.:480; I.V.:2328.3; IV Piggyback:600] Out: -  Intake/Output this shift: Total I/O In: 300 [P.O.:200; IV Piggyback:100] Out: -   General appearance: alert, cooperative and no distress GI: soft, non-tender; bowel sounds normal; no masses,  no organomegaly  Lab Results:   Recent Labs  04/28/17 0513 04/29/17 0608  WBC 6.9 7.5  HGB 13.7 14.7  HCT 39.4 43.2  PLT 280 317    BMET  Recent Labs  04/28/17 0513 04/29/17 0608  NA 140 138  K 3.7 4.0  CL 109 107  CO2 26 24  GLUCOSE 113* 108*  BUN 6 <5*  CREATININE 0.86 0.89  CALCIUM 8.7* 8.8*   PT/INR No results for input(s): LABPROT, INR in the last 72 hours.   Recent Labs Lab 04/24/17 1433 04/26/17 2012  AST 16 31  ALT 22 44  ALKPHOS 69 70  BILITOT 0.7 0.7  PROT 7.2 6.9  ALBUMIN 4.3 3.4*     Lipase     Component Value Date/Time   LIPASE 25 04/26/2017 2012     Medications: . ciprofloxacin  500 mg Oral BID  . enoxaparin (LOVENOX) injection  45 mg Subcutaneous Q24H  . fluticasone  2 spray Each Nare Daily  . metroNIDAZOLE  500 mg Oral Q8H    Assessment/Plan Acute sigmoid diverticulitis with colonic perforation and small abscess Focal dilatation right ureter without evidence of stone Hypertension Severe obstructive sleep apnea - no CPAP GERD Hyperlipidemia Erectile dysfunction. Body mass index is 30.7 FEN:  start reg diet ID: IV Flagyl/Cipro 04/26/17 =>> day 4 DVT:  SCD    Plan: continue antibiotics and advance to regular diet. He wants to go home tonight- I am  hesitant would rather he tolerate regular diet for a day then switch to PO abx. I told him we would see how today goes.    LOS: 3 days    Clovis Riley 04/29/2017 [phone]

## 2017-04-29 NOTE — Progress Notes (Signed)
[facility] PROGRESS NOTE  Nathaniel [name] KDT:[id number] DOB: 1966/08/26 DOA: 04/26/2017  PCP: Biagio Borg, MD  Brief History/Interval Summary: 51 year old Caucasian male with a past medical history of hypertension who presented to the emergency department with complaints of worsening abdominal pain. The pain started about 3 days prior to admission and he initially went to his primary care physician. He was thought to have diverticulitis and was prescribed ciprofloxacin. However, patient's symptoms did not improve, so he decided to come into the emergency department. CT scan was done which showed sigmoid diverticulitis with perforation and possible small abscess formation. General surgery was consulted and the patient was hospitalized.  Reason for Visit: Acute sigmoid diverticulitis with small abscess and perforation  Consultants: Gen. surgery  Procedures: None  Antibiotics: Ciprofloxacin and Flagyl  Subjective/Interval History: Patient continues to feel well. Denies any abdominal pain. Did have mild heartburn, which has resolved. No nausea, vomiting. Tolerating his liquid diet.   ROS: Denies any chest pain or shortness of breath  Objective:  Vital Signs  Vitals:   04/28/17 0554 04/28/17 1443 04/28/17 2139 04/29/17 0645  BP: 133/81 105/72 133/82 125/75  Pulse:  87 74 75  Resp:  20 20 18   Temp:  97.9 F (36.6 C) 98.7 F (37.1 C) 97.9 F (36.6 C)  TempSrc:  Oral Oral   SpO2:  96% 98% 97%  Weight:      Height:        Intake/Output Summary (Last 24 hours) at 04/29/17 0941 Last data filed at 04/29/17 0724  Gross per 24 hour  Intake          3508.33 ml  Output                0 ml  Net          3508.33 ml   Filed Weights   04/27/17 0803  Weight: 91.6 kg (201 lb 15.1 oz)    General appearance: Awake, alert. In no distress. Resp: Clear to auscultation bilaterally. No wheezing, rales or rhonchi. Cardio: S1, S2 is normal, regular. No S3, S4. No rubs, murmurs  or bruits GI: Abdomen is soft. Nontender, nondistended. Bowel sounds are present. No masses, organomegaly  Extremities: No edema Neurologic: No focal neurological deficits.  Lab Results:  Data Reviewed: I have personally reviewed following labs and imaging studies  CBC:  Recent Labs Lab 04/24/17 1433 04/26/17 2012 04/27/17 0510 04/28/17 0513 04/29/17 0608  WBC 16.9* 13.7* 13.1* 6.9 7.5  NEUTROABS 14.3*  --   --   --   --   HGB 15.2 14.4 14.2 13.7 14.7  HCT 44.7 42.7 40.5 39.4 43.2  MCV 89.1 87.0 85.6 85.8 85.9  PLT 262.0 307 266 280 998    Basic Metabolic Panel:  Recent Labs Lab 04/24/17 1433 04/26/17 2012 04/27/17 0510 04/28/17 0513 04/29/17 0608  NA 133* 135 138 140 138  K 3.4* 3.4* 3.5 3.7 4.0  CL 99 102 104 109 107  CO2 24 25 26 26 24   GLUCOSE 116* 94 114* 113* 108*  BUN 10 8 5* 6 <5*  CREATININE 0.92 0.91 0.87 0.86 0.89  CALCIUM 9.3 8.9 8.7* 8.7* 8.8*    GFR: Estimated Creatinine Clearance: 105.5 mL/min (by C-G formula based on SCr of 0.89 mg/dL).  Liver Function Tests:  Recent Labs Lab 04/24/17 1433 04/26/17 2012  AST 16 31  ALT 22 44  ALKPHOS 69 70  BILITOT 0.7 0.7  PROT 7.2 6.9  ALBUMIN 4.3 3.4*  Recent Labs Lab 04/26/17 2012  LIPASE 25     Radiology Studies: No results found.   Medications:  Scheduled: . ciprofloxacin  500 mg Oral BID  . enoxaparin (LOVENOX) injection  45 mg Subcutaneous Q24H  . fluticasone  2 spray Each Nare Daily  . metroNIDAZOLE  500 mg Oral Q8H   Continuous: . dextrose 5 % and 0.9 % NaCl with KCl 20 mEq/L 100 mL/hr at 04/29/17 0226   YTK:[id number] **OR** acetaminophen, hydrALAZINE, morphine injection, ondansetron **OR** ondansetron (ZOFRAN) IV  Assessment/Plan:  Principal Problem:   Abscess of sigmoid colon due to diverticulitis Active Problems:   Obstructive sleep apnea   Essential hypertension   Perforation of sigmoid colon due to diverticulitis    Acute sigmoid diverticulitis with  perforation and small abscess formation. Patient seems to be improving. WBC count is normal. Patient is afebrile. Tolerating his liquid diet. Abdomen is benign. Discussed with general surgery PA. Change to oral antibiotics. Defer advancement of diet to general surgery. Cut back on IV fluids. Mobilize.   Right ureter dilatation. Seen incidentally on CT scan. Could be congenital. No previous imaging studies in our system for comparison. No stones noted. Patient denies any history of nephrolithiasis. No blood noted in UA. Creatinine is normal. This was discussed with the patient. He will need outpatient urological consultation.  History of essential hypertension. Blood pressure is reasonably well controlled. IV hydralazine as needed. His home medication is on hold for now.  History of obstructive sleep apnea. Has been unable to tolerate CPAP previously. Continue to monitor.  DVT Prophylaxis: Lovenox    Code Status: Full code  Family Communication: Discussed with the patient  Disposition Plan: Management as outlined above. Continue to monitor. Discharge when cleared by surgery.    LOS: 3 days   [facility] Pager [phone] 04/29/2017, 9:41 AM  If 7PM-7AM, please contact night-coverage at [email or web address], password [facility]

## 2017-04-30 LAB — CBC
HCT: 41.7 % (ref 39.0–52.0)
Hemoglobin: 14.5 g/dL (ref 13.0–17.0)
MCH: 29.5 pg (ref 26.0–34.0)
MCHC: 34.8 g/dL (ref 30.0–36.0)
MCV: 84.9 fL (ref 78.0–100.0)
PLATELETS: 329 10*3/uL (ref 150–400)
RBC: 4.91 MIL/uL (ref 4.22–5.81)
RDW: 12.4 % (ref 11.5–15.5)
WBC: 6.5 10*3/uL (ref 4.0–10.5)

## 2017-04-30 MED ORDER — METRONIDAZOLE 500 MG PO TABS: 500.0000 mg | ORAL_TABLET | Freq: Three times a day (TID) | ORAL | 0 refills | Status: AC

## 2017-04-30 MED ORDER — CIPROFLOXACIN HCL 500 MG PO TABS: 500.0000 mg | ORAL_TABLET | Freq: Two times a day (BID) | ORAL | 0 refills | Status: AC

## 2017-04-30 NOTE — Progress Notes (Signed)
CC:  Abdominal pain  Subjective: Pt doing well, no pain on a regular diet, day 2 oral antibiotics.  Objective: Vital signs in last 24 hours: Temp:  [97.7 F (36.5 C)-98.4 F (36.9 C)] 98.4 F (36.9 C) (07/29 0548) Pulse Rate:  [72-83] 72 (07/29 0548) Resp:  [18] 18 (07/29 0548) BP: (126-127)/(83-84) 127/83 (07/28 2150) SpO2:  [95 %-98 %] 96 % (07/29 0548) Last BM Date: 04/29/17 640 PO recorded Urine - 6 Afebrile, VSS WBC 6.5   Intake/Output from previous day: 07/28 0701 - 07/29 0700 In: 740 [P.O.:640; IV Piggyback:100] Out: -  Intake/Output this shift: No intake/output data recorded.  General appearance: alert, cooperative and no distress GI: soft, non-tender; bowel sounds normal; no masses,  no organomegaly  Lab Results:   Recent Labs  04/29/17 0608 04/30/17 0529  WBC 7.5 6.5  HGB 14.7 14.5  HCT 43.2 41.7  PLT 317 329    BMET  Recent Labs  04/28/17 0513 04/29/17 0608  NA 140 138  K 3.7 4.0  CL 109 107  CO2 26 24  GLUCOSE 113* 108*  BUN 6 <5*  CREATININE 0.86 0.89  CALCIUM 8.7* 8.8*   PT/INR No results for input(s): LABPROT, INR in the last 72 hours.   Recent Labs Lab 04/24/17 1433 04/26/17 2012  AST 16 31  ALT 22 44  ALKPHOS 69 70  BILITOT 0.7 0.7  PROT 7.2 6.9  ALBUMIN 4.3 3.4*     Lipase     Component Value Date/Time   LIPASE 25 04/26/2017 2012     Medications: . ciprofloxacin  500 mg Oral BID  . enoxaparin (LOVENOX) injection  45 mg Subcutaneous Q24H  . fluticasone  2 spray Each Nare Daily  . metroNIDAZOLE  500 mg Oral Q8H    Anti-infectives    Start     Dose/Rate Route Frequency Ordered Stop   04/29/17 0945  ciprofloxacin (CIPRO) tablet 500 mg     500 mg Oral 2 times daily 04/29/17 0941     04/29/17 0945  metroNIDAZOLE (FLAGYL) tablet 500 mg     500 mg Oral Every 8 hours 04/29/17 0941     04/27/17 1015  ciprofloxacin (CIPRO) IVPB 400 mg  Status:  Discontinued     400 mg 200 mL/hr over 60 Minutes Intravenous  Every 12 hours 04/27/17 1001 04/29/17 0941   04/27/17 0600  metroNIDAZOLE (FLAGYL) IVPB 500 mg  Status:  Discontinued     500 mg 100 mL/hr over 60 Minutes Intravenous Every 8 hours 04/26/17 2309 04/29/17 0941   04/26/17 2130  ciprofloxacin (CIPRO) IVPB 400 mg     400 mg 200 mL/hr over 60 Minutes Intravenous  Once 04/26/17 2123 04/26/17 2317   04/26/17 2130  metroNIDAZOLE (FLAGYL) IVPB 500 mg     500 mg 100 mL/hr over 60 Minutes Intravenous  Once 04/26/17 2123 04/26/17 2317      Assessment/Plan Acute sigmoid diverticulitis with colonic perforation and small abscess Focal dilatation right ureter without evidence of stone Hypertension Severe obstructive sleep apnea - no CPAP GERD Hyperlipidemia Erectile dysfunction. Body mass index is 30.7 FEN: IV fluids/NPO  =>> regular diet ID: IV Flagyl/Cipro 04/26/17 -04/28/17  7/28 =>> oral Cipro/Flagyl  Day 2 DVT: SCD    Plan:  He can go home from our standpoint.  Follow up with PCP now  and GI in about 6-8 weeks.  We recommend 10 days of treatment.  WE will see again as needed.   LOS: 4 days  Earnstine Regal 04/30/2017 [phone]

## 2017-04-30 NOTE — Discharge Summary (Signed)
[facility]  Physician Discharge Summary   Patient ID: Nathaniel [name] MRN: [id number] DOB/AGE: Jun 13, 1966 51 y.o.  Admit date: 04/26/2017 Discharge date: 05/01/2017  PCP: Biagio Borg, MD  DISCHARGE DIAGNOSES:  Principal Problem:   Abscess of sigmoid colon due to diverticulitis Active Problems:   Obstructive sleep apnea   Essential hypertension   Perforation of sigmoid colon due to diverticulitis   RECOMMENDATIONS FOR OUTPATIENT FOLLOW UP: 1. General surgery recommends follow-up with gastroenterology in 6-8 weeks. 2. PCP to consider outpatient referral to urology regarding incidental CT finding as discussed below.   DISCHARGE CONDITION: fair  Diet recommendation: As before  [facility] Weights   04/27/17 0803  Weight: 91.6 kg (201 lb 15.1 oz)    INITIAL HISTORY: 51 year old Caucasian male with a past medical history of hypertension who presented to the emergency department with complaints of worsening abdominal pain. The pain started about 3 days prior to admission and he initially went to his primary care physician. He was thought to have diverticulitis and was prescribed ciprofloxacin. However, patient's symptoms did not improve, so he decided to come into the emergency department. CT scan was done which showed sigmoid diverticulitis with perforation and possible small abscess formation. General surgery was consulted and the patient was hospitalized.  Consultations:  General surgery  Procedures:  None  HOSPITAL COURSE:   Acute sigmoid diverticulitis with perforation and small abscess formation. Patient has improved significantly. He is afebrile. WBC count is normal. Tolerating his solid food. He was already transitioned to oral antibiotics. Cleared by general surgery for discharge. They would like him to follow-up with gastroenterology to consider colonoscopy in 6-8 weeks. His last colonoscopy was in 2015, which did reveal diverticulosis.   Right ureter  dilatation. Seen incidentally on CT scan. Could be congenital. No previous imaging studies in our system for comparison. No stones noted. Patient denies any history of nephrolithiasis. No blood noted in UA. Creatinine is normal. This was discussed with the patient. He will need outpatient urological consultation.  History of essential hypertension. Stable. Resume home medications.  Overall, stable. Ambulating without any difficulties. Tolerated his solids. Okay for discharge.    PERTINENT LABS:  The results of significant diagnostics from this hospitalization (including imaging, microbiology, ancillary and laboratory) are listed below for reference.     Labs: Basic Metabolic Panel:  Recent Labs Lab 04/26/17 2012 04/27/17 0510 04/28/17 0513 04/29/17 0608  NA 135 138 140 138  K 3.4* 3.5 3.7 4.0  CL 102 104 109 107  CO2 25 26 26 24   GLUCOSE 94 114* 113* 108*  BUN 8 5* 6 <5*  CREATININE 0.91 0.87 0.86 0.89  CALCIUM 8.9 8.7* 8.7* 8.8*   Liver Function Tests:  Recent Labs Lab 04/26/17 2012  AST 31  ALT 44  ALKPHOS 70  BILITOT 0.7  PROT 6.9  ALBUMIN 3.4*    Recent Labs Lab 04/26/17 2012  LIPASE 25   CBC:  Recent Labs Lab 04/26/17 2012 04/27/17 0510 04/28/17 0513 04/29/17 0608 04/30/17 0529  WBC 13.7* 13.1* 6.9 7.5 6.5  HGB 14.4 14.2 13.7 14.7 14.5  HCT 42.7 40.5 39.4 43.2 41.7  MCV 87.0 85.6 85.8 85.9 84.9  PLT 307 266 280 317 329    IMAGING STUDIES Ct Abdomen Pelvis W Contrast  Result Date: 04/26/2017 CLINICAL DATA:  Lower abdominal pain for 5 days.  Weight loss. EXAM: CT ABDOMEN AND PELVIS WITH CONTRAST TECHNIQUE: Multidetector CT imaging of the abdomen and pelvis was performed using the standard  protocol following bolus administration of intravenous contrast. CONTRAST:  149mL ISOVUE-300 IOPAMIDOL (ISOVUE-300) INJECTION 61% COMPARISON:  None. FINDINGS: Lower chest: Normal. Hepatobiliary: Several tiny cysts in the liver. Liver parenchyma is otherwise  normal. Biliary tree appears normal. Pancreas: Unremarkable. No pancreatic ductal dilatation or surrounding inflammatory changes. Spleen: Normal in size without focal abnormality. Adrenals/Urinary Tract: The adrenal glands and kidneys appear normal. The distal right ureter is dilated but there is no evidence of a stone or mass in the ureter or bladder. 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 There is no delayed excretion of contrast from the right kidney and the proximal ureter is not dilated. Stomach/Bowel: There is a focal: Perforation in the mid sigmoid region air in the mesenteric in as well as a small amount of fluid adjacent to the air consistent with a small diverticular abscess. There is marked edema of the mucosa in the area of inflammation. There are rare diverticula in the colon. The appendix and terminal ileum appear normal. Vascular/Lymphatic: No significant vascular findings are present. No enlarged abdominal or pelvic lymph nodes. Reproductive: Prostate is unremarkable. Other: No abdominal wall hernia or abnormality. No abdominopelvic ascites. Musculoskeletal: No acute abnormality. Severe spinal stenosis at L4-5. IMPRESSION: 1. Acute sigmoid diverticulitis with colonic perforation with a small pericolonic abscess with air and fluid in the pericolonic soft tissues. 2. Focal dilatation of the distal right ureter without evidence of a stone. Correlation with urinalysis is suggested. 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 I cannot exclude a distal ureteral mass. Electronically Signed   By: Lorriane Shire M.D.   On: 04/26/2017 14:41    DISCHARGE EXAMINATION: Vitals:   04/29/17 0645 04/29/17 1359 04/29/17 2150 04/30/17 0548  BP: 125/75 126/84 127/83   Pulse: 75 83 79 72  Resp: 18 18 18 18   Temp: 97.9 F (36.6 C) 97.7 F (36.5 C) 97.8 F (36.6 C) 98.4 F (36.9 C)  TempSrc:  Oral Oral Oral  SpO2: 97% 98% 95% 96%    Weight:      Height:       General appearance: alert, cooperative, appears stated age and no distress Resp: clear to auscultation bilaterally Cardio: regular rate and rhythm, S1, S2 normal, no murmur, click, rub or gallop GI: soft, non-tender; bowel sounds normal; no masses,  no organomegaly Extremities: extremities normal, atraumatic, no cyanosis or edema  DISPOSITION: Home  Discharge Instructions    Call MD for:  extreme fatigue    Complete by:  As directed    Call MD for:  persistant dizziness or light-headedness    Complete by:  As directed    Call MD for:  persistant nausea and vomiting    Complete by:  As directed    Call MD for:  severe uncontrolled pain    Complete by:  As directed    Call MD for:  temperature >100.4    Complete by:  As directed    Discharge instructions    Complete by:  As directed    Please follow up with your PCP next week. General surgery recommends that you also see your Gastroenterologist in 6-8 weeks. Your PCP can make this referral.  You were cared for by a hospitalist during your hospital stay. If you have any questions about your discharge medications or the care you received while you were in the hospital after you are discharged, you can call the unit and asked to speak with the hospitalist on call  if the hospitalist that took care of you is not available. Once you are discharged, your primary care physician will handle any further medical issues. Please note that NO REFILLS for any discharge medications will be authorized once you are discharged, as it is imperative that you return to your primary care physician (or establish a relationship with a primary care physician if you do not have one) for your aftercare needs so that they can reassess your need for medications and monitor your lab values. If you do not have a primary care physician, you can call [phone] for a physician referral.   Increase activity slowly    Complete by:  As directed        ALLERGIES:  Allergies  Allergen Reactions  . Amoxicillin-Pot Clavulanate Nausea And Vomiting    REACTION: nausea/GI upset     Discharge Medication List as of 04/30/2017 10:36 AM    START taking these medications   Details  metroNIDAZOLE (FLAGYL) 500 MG tablet Take 1 tablet (500 mg total) by mouth every 8 (eight) hours., Starting Sun 04/30/2017, Until Wed 05/10/2017, Print      CONTINUE these medications which have CHANGED   Details  ciprofloxacin (CIPRO) 500 MG tablet Take 1 tablet (500 mg total) by mouth 2 (two) times daily., Starting Sun 04/30/2017, Until Wed 05/10/2017, Print      CONTINUE these medications which have NOT CHANGED   Details  ALPRAZolam (XANAX) 0.5 MG tablet Take 1 tablet (0.5 mg total) by mouth 2 (two) times daily as needed for anxiety., Starting Mon 04/24/2017, Print    fluticasone (FLONASE) 50 MCG/ACT nasal spray Place 2 sprays into both nostrils daily., Historical Med    losartan (COZAAR) 50 MG tablet Take 1 tablet (50 mg total) by mouth daily., Starting Wed 10/05/2016, Normal      STOP taking these medications     aspirin EC 81 MG tablet      sildenafil (VIAGRA) 100 MG tablet          Follow-up Information    Biagio Borg, MD. Schedule an appointment as soon as possible for a visit in 1 week(s).   Specialties:  Internal Medicine, Radiology Contact information: [address] [place] [address] [phone]        Irene Shipper, MD. Schedule an appointment as soon as possible for a visit in 7 week(s).   Specialty:  Gastroenterology Contact information: [address] [address] [phone]           TOTAL DISCHARGE TIME: 39 mins  [facility] Pager [phone]  05/01/2017, 2:54 PM

## 2017-04-30 NOTE — Discharge Instructions (Signed)
Diverticulosis  Diverticulosis is a condition that develops when small pouches (diverticula) form in the wall of the large intestine (colon). The colon is where water is absorbed and stool is formed. The pouches form when the inside layer of the colon pushes through weak spots in the outer layers of the colon. You may have a few pouches or many of them.  What are the causes?  The cause of this condition is not known.  What increases the risk?  The following factors may make you more likely to develop this condition:   Being older than age 60. Your risk for this condition increases with age. Diverticulosis is rare among people younger than age 30. By age 80, many people have it.   Eating a low-fiber diet.   Having frequent constipation.   Being overweight.   Not getting enough exercise.   Smoking.   Taking over-the-counter pain medicines, like aspirin and ibuprofen.   Having a family history of diverticulosis.    What are the signs or symptoms?  In most people, there are no symptoms of this condition. If you do have symptoms, they may include:   Bloating.   Cramps in the abdomen.   Constipation or diarrhea.   Pain in the lower left side of the abdomen.    How is this diagnosed?  This condition is most often diagnosed during an exam for other colon problems. Because diverticulosis usually has no symptoms, it often cannot be diagnosed independently. This condition may be diagnosed by:   Using a flexible scope to examine the colon (colonoscopy).   Taking an X-ray of the colon after dye has been put into the colon (barium enema).   Doing a CT scan.    How is this treated?  You may not need treatment for this condition if you have never developed an infection related to diverticulosis. If you have had an infection before, treatment may include:   Eating a high-fiber diet. This may include eating more fruits, vegetables, and grains.   Taking a fiber supplement.   Taking a live bacteria supplement  (probiotic).   Taking medicine to relax your colon.   Taking antibiotic medicines.    Follow these instructions at home:   Drink 6-8 glasses of water or more each day to prevent constipation.   Try not to strain when you have a bowel movement.   If you have had an infection before:  ? Eat more fiber as directed by your health care provider or your diet and nutrition specialist (dietitian).  ? Take a fiber supplement or probiotic, if your health care provider approves.   Take over-the-counter and prescription medicines only as told by your health care provider.   If you were prescribed an antibiotic, take it as told by your health care provider. Do not stop taking the antibiotic even if you start to feel better.   Keep all follow-up visits as told by your health care provider. This is important.  Contact a health care provider if:   You have pain in your abdomen.   You have bloating.   You have cramps.   You have not had a bowel movement in 3 days.  Get help right away if:   Your pain gets worse.   Your bloating becomes very bad.   You have a fever or chills, and your symptoms suddenly get worse.   You vomit.   You have bowel movements that are bloody or black.   You have   bleeding from your rectum.  Summary   Diverticulosis is a condition that develops when small pouches (diverticula) form in the wall of the large intestine (colon).   You may have a few pouches or many of them.   This condition is most often diagnosed during an exam for other colon problems.   If you have had an infection related to diverticulosis, treatment may include increasing the fiber in your diet, taking supplements, or taking medicines.  This information is not intended to replace advice given to you by your health care provider. Make sure you discuss any questions you have with your health care provider.  Document Released: 06/16/2004 Document Revised: 08/08/2016 Document Reviewed: 08/08/2016  Elsevier Interactive  Patient Education  2017 [employer]

## 2017-04-30 NOTE — Progress Notes (Signed)
Pt was feeling a lot better, tolerated regular diet. Denies abd pain. Discharge instructions given, verbalized understanding. Discharged home.

## 2017-05-03 ENCOUNTER — Telehealth: Payer: Self-pay | Admitting: Internal Medicine

## 2017-05-03 NOTE — Telephone Encounter (Signed)
Patient stopped by off to give Dr.Rohit and FYI :  After his hospital stay he is going to follow up with GI. He has a follow up appointment with Dr.Lyal on Tuesday Aug 7.

## 2017-05-05 ENCOUNTER — Ambulatory Visit: Payer: BLUE CROSS/BLUE SHIELD | Admitting: Physician Assistant

## 2017-05-09 ENCOUNTER — Ambulatory Visit (INDEPENDENT_AMBULATORY_CARE_PROVIDER_SITE_OTHER): Payer: BLUE CROSS/BLUE SHIELD | Admitting: Internal Medicine

## 2017-05-09 ENCOUNTER — Encounter: Payer: Self-pay | Admitting: Internal Medicine

## 2017-05-09 VITALS — BP 120/88 | HR 95 | Ht 68.0 in | Wt 193.0 lb

## 2017-05-09 DIAGNOSIS — I10 Essential (primary) hypertension: Secondary | ICD-10-CM

## 2017-05-09 DIAGNOSIS — K5720 Diverticulitis of large intestine with perforation and abscess without bleeding: Secondary | ICD-10-CM

## 2017-05-09 DIAGNOSIS — N2882 Megaloureter: Secondary | ICD-10-CM

## 2017-05-09 NOTE — Patient Instructions (Signed)
Please continue all other medications as before, and refills have been done if requested.  Please have the pharmacy call with any other refills you may need.  Please keep your appointments with your specialists as you may have planned - Gastroenterology  You will be contacted regarding the referral for: urology

## 2017-05-09 NOTE — Progress Notes (Signed)
Subjective:    Patient ID: Nathaniel [name], male    DOB: 1965/11/08, 51 y.o.   MRN: [id number]  HPI  Here 9 days post hosp d/c after inpatient tx for abscess related diverticulitis with perforation, overall mild per pt, was on oral antibx and still working, no sepsis.  No longer constipated feeling with diet change and probiotic.  Post d/c has done ok - Denies worsening reflux, abd pain, dysphagia, n/v, bowel change or blood. Did lose wt with illness. Asking for nutrition referral.  Missed his appt with PA for GI yesterday, was thinking it was today, and has already rescheduled.   Wt Readings from Last 3 Encounters:  05/09/17 193 lb (87.5 kg)  04/27/17 201 lb 15.1 oz (91.6 kg)  04/24/17 202 lb (91.6 kg)  Denies urinary symptoms such as dysuria, frequency, urgency, flank pain, hematuria or n/v, fever, chills.  Pt denies chest pain, increased sob or doe, wheezing, orthopnea, PND, increased LE swelling, palpitations, dizziness or syncope.  CT did note dilation of the distal right ureter, etiology of which is unknown.   Past Medical History:  Diagnosis Date  . ADD 05/03/2007  . ALLERGIC RHINITIS 11/25/2007  . ANXIETY 05/03/2007  . CHEST PAIN 07/28/2010  . Diverticulosis   . 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 GERD 07/31/2010  . HYPERLIPIDEMIA 08/01/2007  . Hypertension   . INSOMNIA 10/17/2007  . Memory loss 11/23/2007  . PSA, INCREASED 01/06/2010  . SLEEP APNEA, OBSTRUCTIVE, SEVERE 05/03/2007   no cpap use   Past Surgical History:  Procedure Laterality Date  . NASAL SINUS SURGERY     dr Janace Hoard  . TONSILLECTOMY      reports that he has never smoked. He has never used smokeless tobacco. He reports that he drinks alcohol. He reports that he does not use drugs. family history includes Cancer in his maternal grandfather; Liver disease in his sister. Allergies  Allergen Reactions  . Amoxicillin-Pot Clavulanate Nausea And Vomiting    REACTION: nausea/GI upset   Current Outpatient  Prescriptions on File Prior to Visit  Medication Sig Dispense Refill  . ALPRAZolam (XANAX) 0.5 MG tablet Take 1 tablet (0.5 mg total) by mouth 2 (two) times daily as needed for anxiety. 30 tablet 0  . ciprofloxacin (CIPRO) 500 MG tablet Take 1 tablet (500 mg total) by mouth 2 (two) times daily. 20 tablet 0  . fluticasone (FLONASE) 50 MCG/ACT nasal spray Place 2 sprays into both nostrils daily.    Marland Kitchen losartan (COZAAR) 50 MG tablet Take 1 tablet (50 mg total) by mouth daily. 90 tablet 3  . metroNIDAZOLE (FLAGYL) 500 MG tablet Take 1 tablet (500 mg total) by mouth every 8 (eight) hours. 30 tablet 0   No current facility-administered medications on file prior to visit.    Review of Systems  Constitutional: Negative for other unusual diaphoresis or sweats HENT: Negative for ear discharge or swelling Eyes: Negative for other worsening visual disturbances Respiratory: Negative for stridor or other swelling  Gastrointestinal: Negative for worsening distension or other blood Genitourinary: Negative for retention or other urinary change Musculoskeletal: Negative for other MSK pain or swelling Skin: Negative for color change or other new lesions Neurological: Negative for worsening tremors and other numbness  Psychiatric/Behavioral: Negative for worsening agitation or other fatigue All other system neg per pt    Objective:   Physical Exam BP 120/88   Pulse 95   Ht 5\' 8"  (1.727 m)   Wt 193 lb (87.5 kg)  SpO2 99%   BMI 29.35 kg/m  VS noted,  Constitutional: Pt appears in NAD HENT: Head: NCAT.  Right Ear: External ear normal.  Left Ear: External ear normal.  Eyes: . Pupils are equal, round, and reactive to light. Conjunctivae and EOM are normal Nose: without d/c or deformity Neck: Neck supple. Gross normal ROM Cardiovascular: Normal rate and regular rhythm.   Pulmonary/Chest: Effort normal and breath sounds without rales or wheezing.  Abd:  Soft, NT, ND, + BS, no organomegaly - benign  exam, no flank tender Neurological: Pt is alert. At baseline orientation, motor grossly intact Skin: Skin is warm. No rashes, other new lesions, no LE edema Psychiatric: Pt behavior is normal without agitation  No other exam findings Lab Results  Component Value Date   WBC 6.5 04/30/2017   HGB 14.5 04/30/2017   HCT 41.7 04/30/2017   PLT 329 04/30/2017   GLUCOSE 108 (H) 04/29/2017   CHOL 174 10/05/2016   TRIG 232.0 (H) 10/05/2016   HDL 34.50 (L) 10/05/2016   LDLDIRECT 110.0 10/05/2016   LDLCALC 87 03/25/2015   ALT 44 04/26/2017   AST 31 04/26/2017   NA 138 04/29/2017   K 4.0 04/29/2017   CL 107 04/29/2017   CREATININE 0.89 04/29/2017   BUN <5 (L) 04/29/2017   CO2 24 04/29/2017   TSH 3.22 10/05/2016   PSA 0.49 10/05/2016   HGBA1C 5.2 12/23/2011   04/26/2017 CT ABDOMEN AND PELVIS WITH CONTRAST - summary IMPRESSION: 1. Acute sigmoid diverticulitis with colonic perforation with a small pericolonic abscess with air and fluid in the pericolonic soft tissues. 2. Focal dilatation of the distal right ureter without evidence of a stone. Correlation with urinalysis is suggested. 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 I cannot exclude a distal ureteral mass.     Assessment & Plan:

## 2017-05-10 ENCOUNTER — Telehealth: Payer: Self-pay | Admitting: Internal Medicine

## 2017-05-10 NOTE — Assessment & Plan Note (Signed)
S/p antibx tx and resolved, exam benign, ok to cont to follow

## 2017-05-10 NOTE — Assessment & Plan Note (Signed)
?   Significance, may be benign but cant r/o distal ureter malignancy, for urology referral

## 2017-05-10 NOTE — Assessment & Plan Note (Signed)
stable overall by history and exam, recent data reviewed with pt, and pt to continue medical treatment as before,  to f/u any worsening symptoms or concerns BP Readings from Last 3 Encounters:  05/09/17 120/88  04/29/17 127/83  04/24/17 118/80

## 2017-05-10 NOTE — Telephone Encounter (Signed)
Left message for pt to call back  °

## 2017-05-11 NOTE — Telephone Encounter (Signed)
Pt was in the hospital for diverticulitis with small perf/abscess. He has just finished up the antibiotic and is scheduled to see Nicoletta Ba PA on 05/19/17. Pt aware of this appt and knows to keep it as scheduled.

## 2017-05-19 ENCOUNTER — Encounter: Payer: Self-pay | Admitting: Physician Assistant

## 2017-05-19 ENCOUNTER — Ambulatory Visit (INDEPENDENT_AMBULATORY_CARE_PROVIDER_SITE_OTHER): Payer: BLUE CROSS/BLUE SHIELD | Admitting: Physician Assistant

## 2017-05-19 VITALS — BP 104/72 | HR 88 | Ht 68.0 in | Wt 189.1 lb

## 2017-05-19 DIAGNOSIS — K5792 Diverticulitis of intestine, part unspecified, without perforation or abscess without bleeding: Secondary | ICD-10-CM

## 2017-05-19 DIAGNOSIS — R933 Abnormal findings on diagnostic imaging of other parts of digestive tract: Secondary | ICD-10-CM | POA: Diagnosis not present

## 2017-05-19 NOTE — Progress Notes (Signed)
Subjective:    Patient ID: Nathaniel [name], male    DOB: 1966/02/28, 51 y.o.   MRN: [id number]  HPI [name] is a pleasant 51 year old white male, known to Dr. Henrene Pastor from prior colonoscopy who comes in today for follow-up after recent hospitalization with acute perforated diverticulitis. Patient states this was his first episode of diverticulitis. He became ill around July 2oth with crampy lower abdominal pain that was not severe. He did not have any associated fever or chills. He was initially seen by primary care and started on a course of Cipro and Flagyl. CBC returned showing an elevated WBC at 13.9 and subsequent CT of the abdomen and pelvis was done on 04/26/2017. This showed small hepatic cysts a dilated distal right ureter question congenital versus ureteral lesion, was also noted have focal perforation of the mid sigmoid with air in the mesentery and a small amount of fluid adjacent adjacent to the year consistent with a small abscess. There was marked edema of the mucosa in the area of the inflammation. Patient was hospitalized, he was followed by surgery during hospitalization, was treated with IV antibiotics and then discharged 4 days later to complete a 10 day course of Cipro and Flagyl. He finished the antibiotics on 05/10/2017. He says his symptoms have resolved but he still doesn't feel 100%. He says it was disconcerting because he never had severe pain with any of this. His appetite has been okay, bowel movements are regular. He has started a probiotic. The is concerned about what he should or should not be eating, and was concerned because he had not been told to follow up with GI. Last colonoscopy was done in September 2015 per Dr. Henrene Pastor with finding of mild diverticulosis in the left colon otherwise negative exam. Other medical problems include hypertension, GERD, anxiety and hyperlipidemia.  Review of Systems Pertinent positive and negative review of systems were noted in the above  HPI section.  All other review of systems was otherwise negative.  Outpatient Encounter Prescriptions as of 05/19/2017  Medication Sig  . fluticasone (FLONASE) 50 MCG/ACT nasal spray Place 2 sprays into both nostrils daily.  Marland Kitchen losartan (COZAAR) 50 MG tablet Take 1 tablet (50 mg total) by mouth daily.  Marland Kitchen ALPRAZolam (XANAX) 0.5 MG tablet Take 1 tablet (0.5 mg total) by mouth 2 (two) times daily as needed for anxiety. (Patient not taking: Reported on 05/19/2017)   No facility-administered encounter medications on file as of 05/19/2017.    Allergies  Allergen Reactions  . Amoxicillin-Pot Clavulanate Nausea And Vomiting    REACTION: nausea/GI upset   Patient Active Problem List   Diagnosis Date Noted  . Dilation of right ureter 05/09/2017  . Abscess of sigmoid colon due to diverticulitis 04/26/2017  . Perforation of sigmoid colon due to diverticulitis 04/26/2017  . Abdominal pain 04/24/2017  . Obesity 10/05/2016  . Right otitis media 07/30/2015  . Essential hypertension 03/25/2015  . Acute bronchitis 08/12/2014  . Elevated blood pressure reading without diagnosis of hypertension 12/21/2013  . Encounter for well adult exam with abnormal findings 09/12/2011  . GERD 07/31/2010  . 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 PSA, INCREASED 01/06/2010  . ALLERGIC RHINITIS 11/25/2007  . INSOMNIA 10/17/2007  . Hyperlipidemia 08/01/2007  . Anxiety state 05/03/2007  . ADD 05/03/2007  . Obstructive sleep apnea 05/03/2007   Social History   Social History  . Marital status: Married    Spouse name: N/A  . Number of children: N/A  . Years  of education: N/A   Occupational History  . Not on file.   Social History Main Topics  . Smoking status: Never Smoker  . Smokeless tobacco: Never Used  . Alcohol use Yes     Comment: socially on weekends  . Drug use: No  . Sexual activity: Not on file   Other Topics Concern  . Not on file   Social History Narrative  . No narrative on file     [name] III's family history includes Cancer in his maternal grandfather; Liver disease in his sister.      Objective:    Vitals:   05/19/17 0934  BP: 104/72  Pulse: 88    Physical Exam  well-developed white male in no acute distress, pleasant blood pressure 104/72 pulse 88, height 5 foot 8, weight 189, BMI 28.7. HEENT; nontraumatic normocephalic EOMI PERRLA sclera anicteric Cardiovascular; regular rate and rhythm with S1-S2 no murmur rub or gallop, Pulmonary ;clear bilaterally, Abdomen; soft, nontender is no palpable mass or hepatosplenomegaly bowel sounds are present, Rectal ;exam not done, Extremities ;no clubbing cyanosis or edema skin warm and dry, Neuropsych ;mood and affect appropriate       Assessment & Plan:   #109 51 year old white male with previously documented diverticulosis who comes in today for follow-up after recent hospitalization with acute focal perforated diverticulitis. He was managed medically and seen by surgery, and not GI during hospitalization. CT on 04/26/2017 did show a small abscess.  He was treated with IV antibiotics for 4 days and then discharged home with 10 days of by mouth Cipro and Flagyl. His symptoms have resolved and he is no persistent abdominal discomfort.  #2 abnormal right ureter noted on recent CT imaging   Plan; long discussion today with patient regarding diverticular disease, and management of diverticulosis and diverticulitis. Greater than 50% of the visit was spent in education and counseling. He will start a high-fiber diet Start Benefiber 1 dose daily, and advised him may use MiraLAX 17 g in 8 ounces of water on a when necessary basis for occasional constipation I do not think he needs repeat colonoscopy at this time as this had been done in September 2015 , and documented left colon diverticulosis, no polyps. He is asked to call us for any recurrent symptoms suggestive of diverticulitis, and he is happy with that plan. Patient  has urology consultation scheduled area He'll follow-up with Dr.  Henrene Pastor  or  myself on an as-needed basis, follow-up colonoscopy 2025 unless he has interval issues   Ellie Bryand Genia Harold PA-C 05/19/2017   Cc: Biagio Borg, MD

## 2017-05-19 NOTE — Patient Instructions (Signed)
Take Benefiber, one dose daily. The store brand is fine too. Miralax 17 grams in 8 oz of water as needed for constipation.   Follow up with Nicoletta Ba PA or Dr. Scarlette Shorts as needed.

## 2017-05-22 NOTE — Progress Notes (Signed)
Assessment and plans reviewed  

## 2017-06-22 ENCOUNTER — Telehealth: Payer: Self-pay | Admitting: Internal Medicine

## 2017-06-22 NOTE — Telephone Encounter (Signed)
Pt stated that he was seen by urology and they did some testing that concluded with him needing to be on 2 antibiotics. He also stated that urology sent notes over and wanted you to look over his hospital notes from him being admitted for 3 days because I informed him that I did not see any antibiotics in his chart. He would also like to know if he still needs to keep his appt he has for next week? Please advise.

## 2017-06-22 NOTE — Telephone Encounter (Signed)
Pt called in said that he has some questions that he wanted to ask Dr Jenny Reichmann nurse.  He would not give me any other info.  He just wanted someone to give him a call

## 2017-06-23 NOTE — Telephone Encounter (Signed)
Pt has been informed and expressed understanding.  

## 2017-06-23 NOTE — Telephone Encounter (Signed)
I would encourage pt to call urology with any question he has about their tx, as this is not my responsibility  Also please encourage pt to keep his follow up appt

## 2017-06-27 ENCOUNTER — Ambulatory Visit (INDEPENDENT_AMBULATORY_CARE_PROVIDER_SITE_OTHER): Payer: BLUE CROSS/BLUE SHIELD | Admitting: Internal Medicine

## 2017-06-27 ENCOUNTER — Encounter: Payer: Self-pay | Admitting: Internal Medicine

## 2017-06-27 VITALS — BP 110/82 | HR 95 | Temp 98.0°F | Ht 68.0 in | Wt 189.0 lb

## 2017-06-27 DIAGNOSIS — K5792 Diverticulitis of intestine, part unspecified, without perforation or abscess without bleeding: Secondary | ICD-10-CM | POA: Diagnosis not present

## 2017-06-27 DIAGNOSIS — F411 Generalized anxiety disorder: Secondary | ICD-10-CM | POA: Diagnosis not present

## 2017-06-27 DIAGNOSIS — Z23 Encounter for immunization: Secondary | ICD-10-CM | POA: Diagnosis not present

## 2017-06-27 DIAGNOSIS — N2882 Megaloureter: Secondary | ICD-10-CM

## 2017-06-27 DIAGNOSIS — I10 Essential (primary) hypertension: Secondary | ICD-10-CM | POA: Diagnosis not present

## 2017-06-27 NOTE — Assessment & Plan Note (Signed)
Presumed, exam benign, ok to finish antibx course

## 2017-06-27 NOTE — Patient Instructions (Signed)
Please continue all other medications as before, and refills have been done if requested.  Please have the pharmacy call with any other refills you may need.  Please continue your efforts at being more active, low cholesterol diet, and weight control.  You are otherwise up to date with prevention measures today.  Please keep your appointments with your specialists as you may have planned  You will be contacted regarding the referral for: Urology at [facility]

## 2017-06-27 NOTE — Assessment & Plan Note (Signed)
stable overall by history and exam, recent data reviewed with pt, and pt to continue medical treatment as before,  to f/u any worsening symptoms or concerns BP Readings from Last 3 Encounters:  06/27/17 110/82  05/19/17 104/72  05/09/17 120/88

## 2017-06-27 NOTE — Assessment & Plan Note (Signed)
?   Etiology, pt asks for second opinoin at [facility]rology - -will refer

## 2017-06-27 NOTE — Progress Notes (Signed)
Subjective:    Patient ID: Nathaniel [name], male    DOB: 1965-10-25, 51 y.o.   MRN: [id number]  HPI  Here to f/u with ? Need antibx repeat; Had f/u with local urology x 2 with repeat CT scan, and pt states counseled on possible need for focal dilation distal ureter evaluation with cystoscopy, but repeat Ct scan was suggestive of inflammation and asked to start here for folow up diverticulitis before consider the cystoscopy.  Does have a "feeling something going on down there" with lower abd discomfort left > right but no fever, n/v, radiation, blood or diarrhea.  Just does not feel good.  Will continue the antibx for now.  Has not needed xanax for nerves so far, and depressive symptoms impoved. Declines need for other tx or referral Past Medical History:  Diagnosis Date  . ADD 05/03/2007  . ALLERGIC RHINITIS 11/25/2007  . ANXIETY 05/03/2007  . CHEST PAIN 07/28/2010  . Diverticulitis large intestine w/o perforation or abscess w/bleeding   . Diverticulosis   . 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 GERD 07/31/2010  . HYPERLIPIDEMIA 08/01/2007  . Hypertension   . INSOMNIA 10/17/2007  . Memory loss 11/23/2007  . PSA, INCREASED 01/06/2010  . SLEEP APNEA, OBSTRUCTIVE, SEVERE 05/03/2007   no cpap use   Past Surgical History:  Procedure Laterality Date  . NASAL SINUS SURGERY     dr Janace Hoard  . TONSILLECTOMY      reports that he has never smoked. He has never used smokeless tobacco. He reports that he drinks alcohol. He reports that he does not use drugs. family history includes Cancer in his maternal grandfather; Liver disease in his sister. Allergies  Allergen Reactions  . Amoxicillin-Pot Clavulanate Nausea And Vomiting    REACTION: nausea/GI upset   Current Outpatient Prescriptions on File Prior to Visit  Medication Sig Dispense Refill  . ALPRAZolam (XANAX) 0.5 MG tablet Take 1 tablet (0.5 mg total) by mouth 2 (two) times daily as needed for anxiety. 30 tablet 0  . fluticasone (FLONASE)  50 MCG/ACT nasal spray Place 2 sprays into both nostrils daily.    Marland Kitchen losartan (COZAAR) 50 MG tablet Take 1 tablet (50 mg total) by mouth daily. 90 tablet 3   No current facility-administered medications on file prior to visit.    Review of Systems  Constitutional: Negative for other unusual diaphoresis or sweats HENT: Negative for ear discharge or swelling Eyes: Negative for other worsening visual disturbances Respiratory: Negative for stridor or other swelling  Gastrointestinal: Negative for worsening distension or other blood Genitourinary: Negative for retention or other urinary change Musculoskeletal: Negative for other MSK pain or swelling Skin: Negative for color change or other new lesions Neurological: Negative for worsening tremors and other numbness  Psychiatric/Behavioral: Negative for worsening agitation or other fatigue All other system neg per pt    Objective:   Physical Exam BP 110/82   Pulse 95   Temp 98 F (36.7 C) (Oral)   Ht 5\' 8"  (1.727 m)   Wt 189 lb (85.7 kg)   SpO2 100%   BMI 28.74 kg/m  VS noted,  Constitutional: Pt appears in NAD HENT: Head: NCAT.  Right Ear: External ear normal.  Left Ear: External ear normal.  Eyes: . Pupils are equal, round, and reactive to light. Conjunctivae and EOM are normal Nose: without d/c or deformity Neck: Neck supple. Gross normal ROM Cardiovascular: Normal rate and regular rhythm.   Pulmonary/Chest: Effort normal and breath sounds without rales  or wheezing.  Abd:  Soft, NT, ND, + BS, no organomegaly - benign,no flank tender Neurological: Pt is alert. At baseline orientation, motor grossly intact Skin: Skin is warm. No rashes, other new lesions, no LE edema Psychiatric: Pt behavior is normal without agitation , mild nervous No other exam findings  Lab Results  Component Value Date   WBC 6.5 04/30/2017   HGB 14.5 04/30/2017   HCT 41.7 04/30/2017   PLT 329 04/30/2017   GLUCOSE 108 (H) 04/29/2017   CHOL 174  10/05/2016   TRIG 232.0 (H) 10/05/2016   HDL 34.50 (L) 10/05/2016   LDLDIRECT 110.0 10/05/2016   LDLCALC 87 03/25/2015   ALT 44 04/26/2017   AST 31 04/26/2017   NA 138 04/29/2017   K 4.0 04/29/2017   CL 107 04/29/2017   CREATININE 0.89 04/29/2017   BUN <5 (L) 04/29/2017   CO2 24 04/29/2017   TSH 3.22 10/05/2016   PSA 0.49 10/05/2016   HGBA1C 5.2 12/23/2011          Assessment & Plan:

## 2017-06-27 NOTE — Assessment & Plan Note (Signed)
Improved per pt, declines need for furhter tx change at this time

## 2017-06-28 ENCOUNTER — Other Ambulatory Visit: Payer: Self-pay | Admitting: Urology

## 2017-07-04 ENCOUNTER — Encounter (HOSPITAL_BASED_OUTPATIENT_CLINIC_OR_DEPARTMENT_OTHER): Payer: Self-pay | Admitting: Certified Registered"

## 2017-07-10 ENCOUNTER — Ambulatory Visit (HOSPITAL_BASED_OUTPATIENT_CLINIC_OR_DEPARTMENT_OTHER): Admission: RE | Admit: 2017-07-10 | Payer: BLUE CROSS/BLUE SHIELD | Source: Ambulatory Visit | Admitting: Urology

## 2017-07-10 SURGERY — CYSTOSCOPY, WITH RETROGRADE PYELOGRAM AND URETERAL STENT INSERTION
Anesthesia: General | Laterality: Right

## 2017-08-04 DIAGNOSIS — K5720 Diverticulitis of large intestine with perforation and abscess without bleeding: Secondary | ICD-10-CM | POA: Insufficient documentation

## 2017-08-08 ENCOUNTER — Telehealth: Payer: Self-pay | Admitting: Internal Medicine

## 2017-08-08 ENCOUNTER — Telehealth: Payer: Self-pay | Admitting: Pulmonary Disease

## 2017-08-08 DIAGNOSIS — G4733 Obstructive sleep apnea (adult) (pediatric): Secondary | ICD-10-CM

## 2017-08-08 NOTE — Telephone Encounter (Signed)
Pt called and has been to [facility] before but he has not been seen in 3 years and needs a new referral, he needs to be seen for sleep apnea, please advise he would like a new referral and would like to be referred to Dr. Asencion Partridge Dohmeier

## 2017-08-08 NOTE — Telephone Encounter (Signed)
Ok, this is done, though I think Dr Maureen Chatters may no longer be in the practice

## 2017-08-08 NOTE — Telephone Encounter (Signed)
Called and spoke with pt and he was last seen by AD in 12/2016.  He stated that he has now met his deductible now and he has a very high insurance plan and he stated that he would like to get the cpap ordered now.  He is aware that AD is no longer here in the office.  Pt does not have a pending appt at this time.  Please advise if pt will need appt first or can we order the cpap and then have the pt come in for follow up?

## 2017-08-09 NOTE — Telephone Encounter (Signed)
Pt has been informed and expressed understanding.  

## 2017-08-10 NOTE — Telephone Encounter (Signed)
Spoke with pt, he states he will talk with Dr. Fernande Bras first to see if he wants her to take over care. He will call us back and let us know if he wants [place] to order sleep study. Nothing further is needed.

## 2017-08-10 NOTE — Telephone Encounter (Signed)
Please let him know that his sleep study was from several years ago.  He would need new sleep study prior to getting CPAP set up.  If he is okay with this, then please arrange for home sleep study.  I can then set up CPAP after review of his home sleep study, assuming he still has sleep apnea.  Please also check whether he is being referred to [facility] for evaluation of his sleep apnea.  There is a phone note from primary care date 08/08/17 indicating that he was being referred to [facility].  If he is being referred to neurology and would prefer to have treatment through them, then he doesn't need set up through our office.

## 2017-08-17 ENCOUNTER — Encounter: Payer: Self-pay | Admitting: Neurology

## 2017-08-17 ENCOUNTER — Ambulatory Visit (INDEPENDENT_AMBULATORY_CARE_PROVIDER_SITE_OTHER): Payer: BLUE CROSS/BLUE SHIELD | Admitting: Neurology

## 2017-08-17 VITALS — BP 122/84 | HR 87 | Ht 68.0 in | Wt 195.0 lb

## 2017-08-17 DIAGNOSIS — F458 Other somatoform disorders: Secondary | ICD-10-CM | POA: Diagnosis not present

## 2017-08-17 DIAGNOSIS — R0683 Snoring: Secondary | ICD-10-CM | POA: Diagnosis not present

## 2017-08-17 DIAGNOSIS — G4733 Obstructive sleep apnea (adult) (pediatric): Secondary | ICD-10-CM | POA: Diagnosis not present

## 2017-08-17 DIAGNOSIS — G4700 Insomnia, unspecified: Secondary | ICD-10-CM

## 2017-08-17 NOTE — Progress Notes (Signed)
SLEEP MEDICINE CLINIC   Provider:  Larey Seat, M D  Primary Care Physician:  Biagio Borg, MD   Referring Provider: Biagio Borg, MD    Chief Complaint  Patient presents with  . New Patient (Initial Visit)    pt was seen here in by Dr Lianne Carreto before and was followed prior to that by pulmonologist Dr Gwenette Greet. sleep study was >10years ago. machine is old and is needing a new one. pt has a current CPAP and he doesnt use it often    HPI:  Nathaniel [name] is a 51 y.o. male , seen here as in a referral from Dr. Jenny Reichmann for follow up on CPAP needs after almost 7 years . He has a very old CPAP now, has met his deductible and would like a new one.  Would like to quote here from the patient's evaluation in January 2009 under Dr. Lanny Hurst Clancy's guidance-  He stated that the patient had very severe obstructive sleep apnea and that the treatment options for those were limited.  A total cure could be achieved by a nasal reconstruction and UPPP mandibular surgery for treatment would be the best option.  He recommended CPAP as well.  He also stated that there were  worsening insomnia complaints.   Chief complaint according to patient : "Moderate sleep quality - it could be improved." he tries to sleep on his side. CPAP cannot be interrogated.   Sleep habits are as follows: He goes to bed around midnight, sometimes has difficulties to go to sleep but most nights does fine.  He will take an over-the-counter nonaddictive sleep aid.  Some nights he will have no bathroom breaks and other nights 1 or 2, he will spontaneously wake up at about 6:30 AM and will be ready to start the day.  However he has only about 6 hours of dedicated time for sleep. The bedroom is cool, quiet and dark. He shares the room with his wife, sleeps on his side and on one contour pillow.   Sleep medical history and family sleep history:  No family history of apnea, no sleep walking, night terrors or enuresis.    Social  history:  Married, wife works in [employer] system . Therapist, sports. Non smoker, no daily ETOH,beer on weekend. Caffeine : tea iced, 2-3 glasses, no sodas energy drinks and no coffee.    Review of Systems: Out of a complete 14 system review, the patient complains of only the following symptoms, and all other reviewed systems are negative.  [name] !!! experiences occasional days of mind fogginess, the feeling that he has to rest to be able to concentrate and be productive again.  He endorsed an average Epworth sleepiness score but a very high fatigue severity score.  I would like to add that the patient never felt comfortable with using CPAP continuously for 4 hours or more at night on 30 days.  CPAP compliance has been an issue and he may benefit from a non-CPAP treatment option. He has a Pharmacist, community and would like a new dental device.    Epworth score 10 , Fatigue severity score 53  , depression score; anxiety    Social History   Socioeconomic History  . Marital status: Married    Spouse name: Not on file  . Number of children: Not on file  . Years of education: Not on file  . Highest education level: Not on file  Social Needs  .  Financial resource strain: Not on file  . Food insecurity - worry: Not on file  . Food insecurity - inability: Not on file  . Transportation needs - medical: Not on file  . Transportation needs - non-medical: Not on file  Occupational History  . Not on file  Tobacco Use  . Smoking status: Never Smoker  . Smokeless tobacco: Never Used  Substance and Sexual Activity  . Alcohol use: Yes    Comment: socially on weekends  . Drug use: No  . Sexual activity: Not on file  Other Topics Concern  . Not on file  Social History Narrative  . Not on file    Family History  Problem Relation Age of Onset  . Cancer Maternal Grandfather        prostate cancer  . Liver disease Sister   . Colon cancer Neg Hx   . Rectal cancer Neg Hx     . Stomach cancer Neg Hx     Past Medical History:  Diagnosis Date  . ADD 05/03/2007  . ALLERGIC RHINITIS 11/25/2007  . ANXIETY 05/03/2007  . CHEST PAIN 07/28/2010  . Diverticulitis large intestine w/o perforation or abscess w/bleeding   . Diverticulosis   . 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 GERD 07/31/2010  . HYPERLIPIDEMIA 08/01/2007  . Hypertension   . INSOMNIA 10/17/2007  . Memory loss 11/23/2007  . PSA, INCREASED 01/06/2010  . SLEEP APNEA, OBSTRUCTIVE, SEVERE 05/03/2007   no cpap use    Past Surgical History:  Procedure Laterality Date  . NASAL SINUS SURGERY     dr Janace Hoard  . TONSILLECTOMY      Current Outpatient Medications  Medication Sig Dispense Refill  . fluticasone (FLONASE) 50 MCG/ACT nasal spray Place 2 sprays into both nostrils daily.    Marland Kitchen losartan (COZAAR) 50 MG tablet Take 1 tablet (50 mg total) by mouth daily. 90 tablet 3  . ALPRAZolam (XANAX) 0.5 MG tablet Take 1 tablet (0.5 mg total) by mouth 2 (two) times daily as needed for anxiety. (Patient not taking: Reported on 08/17/2017) 30 tablet 0   No current facility-administered medications for this visit.     Allergies as of 08/17/2017 - Review Complete 08/17/2017  Allergen Reaction Noted  . Amoxicillin-pot clavulanate Nausea And Vomiting 05/03/2007    Vitals: BP 122/84   Pulse 87   Ht 5' 8"  (1.727 m)   Wt 195 lb (88.5 kg)   BMI 29.65 kg/m  Last Weight:  Wt Readings from Last 1 Encounters:  08/17/17 195 lb (88.5 kg)   GOV:PCHE mass index is 29.65 kg/m.     Last Height:   Ht Readings from Last 1 Encounters:  08/17/17 5' 8"  (1.727 m)    Physical exam:  General: The patient is awake, alert and appears not in acute distress. The patient is well groomed. Head: Normocephalic, atraumatic. Neck is supple. Mallampati 2-3 ,  neck circumference: 16. 25 -  Nasal airflow  Patent , TMJ click not evident . Retrognathia is  *seen.  Cardiovascular:  Regular rate and rhythm , without  murmurs or carotid  bruit, and without distended neck veins. Respiratory: Lungs are clear to auscultation. Skin:  Without evidence of edema, or rash- facial rosacea.  Trunk: BMI is 30- The patient's posture is erect   Neurologic exam : The patient is awake and alert, oriented to place and time.   Memory subjective described as intact.   Speech is fluent,  without dysarthria, dysphonia or aphasia.  Mood and affect  are appropriate.  Cranial nerves: Pupils are equal and briskly reactive to light. Extraocular movements  in vertical and horizontal planes intact and without nystagmus. Visual fields by finger perimetry are intact.Hearing to finger rub intact.  Facial sensation intact to fine touch. Facial motor strength is symmetric and tongue and uvula move midline. Shoulder shrug was symmetrical.   Motor exam:   Normal tone, muscle bulk and symmetric strength in all extremities. Sensory:  Fine touch, pinprick and vibration were tested in all extremities. Proprioception tested in the upper extremities was normal. Wakes sometimes with a numb Coordination: Rapid alternating movements in the fingers/hands was normal. Finger-to-nose maneuver  normal without evidence of ataxia, dysmetria or tremor. Gait and station: Patient walks without assistive device -Turns with 3 Steps. Romberg testing is negative. Deep tendon reflexes: in the  upper and lower extremities are symmetric and intact. Babinski maneuver response is downgoing.    Assessment:  After physical and neurologic examination, review of laboratory studies,  Personal review of imaging studies, reports of other /same  Imaging studies, results of polysomnography and / or neurophysiology testing and pre-existing records as far as provided in visit., my assessment is   1) Nathaniel [name] is today after a hiatus of several years, he would like to obtain a new CPAP machine with auto titration capacity, and would need to be refitted for a new interface.  Since his last  sleep study has been too many years ago I will need to demonstrate again that the patient still has apnea and of what kind.  I would like for him to have a home sleep test rather than an attended sleep study in the lab.    The patient has met his deductible.   Goal is to obtain all necessary information so that the CPAP will be given to him before the end of the calendar year.  He also would like a new mouthpiece which has helped him with his bruxism and has additionally helped to reduce snoring.  I would be happy to make a referral for a local sleep dentist or use the home sleep test for documentation with his previous specialist dentist to obtain a dental device.  2)    The patient was advised of the nature of the diagnosed disorder , the treatment options and the  risks for general health and wellness arising from not treating the condition.   I spent more than 45 minutes of face to face time with the patient.  Greater than 50% of time was spent in counseling and coordination of care. We have discussed the diagnosis and differential and I answered the patient's questions.    Plan:  Treatment plan and additional workup :  HST and based on results auto CPAP or/ and dental device. DDS in [place] , Dr Marisa Cyphers.    Larey Seat, MD 26/33/3545, 62:56 PM  Certified in Neurology by ABPN Certified in [place] by [facility] [address] [place], [place] [address]

## 2017-08-22 ENCOUNTER — Telehealth: Payer: Self-pay | Admitting: Neurology

## 2017-08-22 NOTE — Telephone Encounter (Signed)
error 

## 2017-08-30 ENCOUNTER — Ambulatory Visit (INDEPENDENT_AMBULATORY_CARE_PROVIDER_SITE_OTHER): Payer: BLUE CROSS/BLUE SHIELD | Admitting: Neurology

## 2017-08-30 DIAGNOSIS — F458 Other somatoform disorders: Secondary | ICD-10-CM

## 2017-08-30 DIAGNOSIS — G4733 Obstructive sleep apnea (adult) (pediatric): Secondary | ICD-10-CM

## 2017-08-30 DIAGNOSIS — G4700 Insomnia, unspecified: Secondary | ICD-10-CM

## 2017-08-30 DIAGNOSIS — R0683 Snoring: Secondary | ICD-10-CM

## 2017-08-31 ENCOUNTER — Telehealth: Payer: Self-pay | Admitting: Neurology

## 2017-08-31 NOTE — Telephone Encounter (Signed)
Called the patient back. I dont see where there was any mention of a memory test being completed. If he feels that is necessary we can certainly do that at his next follow up apt. I do see where a home sleep test was ordered for the patient and so once they receive insurance approval someone will be in contact with him to get him scheduled for the home sleep test.  No answer when I called the pt If patient calls back please advise the pt this information and can transfer to sleep lab if he is wanting to know when he will be scheduled for the sleep test.

## 2017-08-31 NOTE — Telephone Encounter (Signed)
Pt states he was supposed to have a memory test but it  has never been scheduled, pt is wanting to discuss with RN Myriam Jacobson please call

## 2017-09-04 ENCOUNTER — Encounter: Payer: Self-pay | Admitting: Neurology

## 2017-09-04 ENCOUNTER — Other Ambulatory Visit: Payer: Self-pay | Admitting: Neurology

## 2017-09-04 ENCOUNTER — Telehealth: Payer: Self-pay | Admitting: Neurology

## 2017-09-04 DIAGNOSIS — F458 Other somatoform disorders: Secondary | ICD-10-CM

## 2017-09-04 DIAGNOSIS — G4733 Obstructive sleep apnea (adult) (pediatric): Secondary | ICD-10-CM

## 2017-09-04 DIAGNOSIS — I10 Essential (primary) hypertension: Secondary | ICD-10-CM

## 2017-09-04 DIAGNOSIS — R0683 Snoring: Secondary | ICD-10-CM

## 2017-09-04 DIAGNOSIS — G4734 Idiopathic sleep related nonobstructive alveolar hypoventilation: Secondary | ICD-10-CM

## 2017-09-04 NOTE — Telephone Encounter (Signed)
Referral sent to Dr. Toy Cookey sleep study attached telephone [phone] - fax [phone] .

## 2017-09-04 NOTE — Telephone Encounter (Signed)
I called pt. I advised pt that Dr. Brett Fairy reviewed their sleep study results and found that pt has moderate to severe sleep apnea. Dr. Brett Fairy recommends that pt CPAP and a dentist referral. I reviewed PAP compliance expectations with the pt. Pt is agreeable to starting a CPAP. I advised pt that an order will be sent to a DME, [facility], and [facility] will call the pt within about one week after they file with the pt's insurance. [facility] will show the pt how to use the machine, fit for masks, and troubleshoot the CPAP if needed. A follow up appt was made for insurance purposes with Cecille Rubin NP on Nov 29, 2017 at 3:15 pm. I have also placed orders for a dentist referral to Dr Toy Cookey at the patients request Pt verbalized understanding to arrive 15 minutes early and bring their CPAP. A letter with all of this information in it will be mailed to the pt as a reminder. I verified with the pt that the address we have on file is correct. Pt verbalized understanding of results. Pt had no questions at this time but was encouraged to call back if questions arise.

## 2017-09-04 NOTE — Telephone Encounter (Signed)
-----   Message from Larey Seat, MD sent at 09/04/2017  8:36 AM EST ----- Confirmed presence of OSA, moderately severe with hypoxemia. I recommend to use a CPAP auto-titration device and will refer to sleep dentist as well.

## 2017-09-04 NOTE — Procedures (Signed)
[facility] [address] [place], [place] [address] NAME: Other Atienza                                                        DOB: 1966/09/11 MEDICAL RECORD No:  [id number]                              DOS: 08/31/2017 REFERRING PHYSICIAN: [name]., M.D.  STUDY PERFORMED: HST HISTORY: Nathaniel [name] is a 51 year old male patient, seen here as in a referral from Dr. Jenny Reichmann for follow up on CPAP needs after almost 7 years. He has a very old CPAP now, has met his deductible and would like a new one. I like to quote here from the patient's evaluation in January 2009 under Dr. Lanny Hurst Clance's guidance- He stated that the patient had very severe obstructive sleep apnea and that the treatment options for those were limited.  A total cure could be achieved by a nasal reconstruction and UPPP with mandibular surgery for treatment would be the best option.  He recommended CPAP as an alternative as well.   [name] never felt comfortable with using CPAP continuously for 4 hours or more at night on 30 days. CPAP compliance has been an issue and he may benefit from a non-CPAP treatment option. He has a Pharmacist, community and would like to explore a new dental device.     Epworth Sleepiness score at 10 points, Fatigue severity score at 53 points, BMI at 30         STUDY RESULTS: Total Recording Time: 5 hours 38 minutes Total Apnea/Hypopnea Index (AHI):  32.7/hr., RDI 35.6/hr.  Average Oxygen Saturation: 92% Lowest Oxygen Saturation: 77 %  Total Time of Oxygen Saturation below 89 %: 61 minutes, equal to 13% Average Heart Rate:  66 bpm (55-107 bpm)  IMPRESSION:  1) The patient still has moderately severe, mostly obstructive sleep apnea with 17% of apneas being central.  2) The patient has hypoxemia- over 1 hour of total desaturation time. 3) There is additional snoring causing additional arousals at RDI 35.6/hr.   RECOMMENDATION: A dental device alone cannot treat this form of  apnea- I will order an auto-titration device with a setting from 6- 15 cm water , 3 cm EPR , and an interface should be fitted to the patient's specification. DME to fit the patient in reclined position: try nasal interface as well as pillows. I will also refer to a sleep dentist for a mandibular advancement device, in a patient with some retrognathia. I certify that I have reviewed the raw data recording prior to the issuance of this report in accordance with the standards of the American Academy of Sleep Medicine (AASM).   Larey Seat, M.D.       09-03-2017   Medical Director of [facility] at [facility] [place] of the ABPN and ABSM and accredited by the AASM

## 2017-09-19 ENCOUNTER — Institutional Professional Consult (permissible substitution): Payer: Self-pay | Admitting: Neurology

## 2017-10-14 ENCOUNTER — Other Ambulatory Visit: Payer: Self-pay | Admitting: Internal Medicine

## 2017-11-16 ENCOUNTER — Ambulatory Visit (INDEPENDENT_AMBULATORY_CARE_PROVIDER_SITE_OTHER): Payer: BLUE CROSS/BLUE SHIELD | Admitting: Internal Medicine

## 2017-11-16 ENCOUNTER — Encounter: Payer: Self-pay | Admitting: Internal Medicine

## 2017-11-16 VITALS — BP 120/86 | HR 82 | Temp 98.2°F | Ht 68.0 in | Wt 200.0 lb

## 2017-11-16 DIAGNOSIS — R079 Chest pain, unspecified: Secondary | ICD-10-CM | POA: Diagnosis not present

## 2017-11-16 DIAGNOSIS — Z0001 Encounter for general adult medical examination with abnormal findings: Secondary | ICD-10-CM | POA: Diagnosis not present

## 2017-11-16 DIAGNOSIS — I10 Essential (primary) hypertension: Secondary | ICD-10-CM

## 2017-11-16 MED ORDER — PANTOPRAZOLE SODIUM 40 MG PO TBEC: 40.0000 mg | DELAYED_RELEASE_TABLET | Freq: Every day | ORAL | 3 refills | Status: DC

## 2017-11-16 NOTE — Progress Notes (Signed)
Subjective:    Patient ID: Nathaniel [name], male    DOB: [birth year], 52 y.o.   MRN: [id number]  HPI  Here for wellness and f/u;  Overall doing ok;  Pt denies worsening SOB, DOE, wheezing, orthopnea, PND, worsening LE edema, palpitations, dizziness or syncope.  Pt denies neurological change such as new headache, facial or extremity weakness.  Pt denies polydipsia, polyuria, or low sugar symptoms. Pt states overall good compliance with treatment and medications, good tolerability, and has been trying to follow appropriate diet.  Pt denies worsening depressive symptoms, suicidal ideation or panic. No fever, night sweats, wt loss, loss of appetite, or other constitutional symptoms.  Pt states good ability with ADL's, has low fall risk, home safety reviewed and adequate, no other significant changes in hearing or vision, and only occasionally active with exercise  Has gained some wt, plans to start being more active Wt Readings from Last 3 Encounters:  11/16/17 200 lb (90.7 kg)  08/17/17 195 lb (88.5 kg)  06/27/17 189 lb (85.7 kg)  Also, pt c/o low SSCP x 2-3 days, dull, mild, intermittent, without radiation, no assoc symptoms, some worse at night before bedtime, and some transient better with TUMS.   Past Medical History:  Diagnosis Date  . ADD 05/03/2007  . ALLERGIC RHINITIS 11/25/2007  . ANXIETY 05/03/2007  . CHEST PAIN 07/28/2010  . Diverticulitis large intestine w/o perforation or abscess w/bleeding   . Diverticulosis   . 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 GERD 07/31/2010  . HYPERLIPIDEMIA 08/01/2007  . Hypertension   . INSOMNIA 10/17/2007  . Memory loss 11/23/2007  . PSA, INCREASED 01/06/2010  . SLEEP APNEA, OBSTRUCTIVE, SEVERE 05/03/2007   no cpap use   Past Surgical History:  Procedure Laterality Date  . NASAL SINUS SURGERY     dr Janace Hoard  . TONSILLECTOMY      reports that  has never smoked. he has never used smokeless tobacco. He reports that he drinks alcohol. He reports that  he does not use drugs. family history includes Cancer in his maternal grandfather; Liver disease in his sister. Allergies  Allergen Reactions  . Amoxicillin-Pot Clavulanate Nausea And Vomiting    REACTION: nausea/GI upset   Current Outpatient Medications on File Prior to Visit  Medication Sig Dispense Refill  . ALPRAZolam (XANAX) 0.5 MG tablet Take 1 tablet (0.5 mg total) by mouth 2 (two) times daily as needed for anxiety. 30 tablet 0  . fluticasone (FLONASE) 50 MCG/ACT nasal spray Place 2 sprays into both nostrils daily.    Marland Kitchen losartan (COZAAR) 50 MG tablet take 1 tablet by mouth once daily 90 tablet 0   No current facility-administered medications on file prior to visit.    Review of Systems Constitutional: Negative for other unusual diaphoresis, sweats, appetite or weight changes HENT: Negative for other worsening hearing loss, ear pain, facial swelling, mouth sores or neck stiffness.   Eyes: Negative for other worsening pain, redness or other visual disturbance.  Respiratory: Negative for other stridor or swelling Cardiovascular: Negative for other palpitations or other chest pain  Gastrointestinal: Negative for worsening diarrhea or loose stools, blood in stool, distention or other pain Genitourinary: Negative for hematuria, flank pain or other change in urine volume.  Musculoskeletal: Negative for myalgias or other joint swelling.  Skin: Negative for other color change, or other wound or worsening drainage.  Neurological: Negative for other syncope or numbness. Hematological: Negative for other adenopathy or swelling Psychiatric/Behavioral: Negative for hallucinations, other worsening agitation,  SI, self-injury, or new decreased concentration All other system neg per pt   Objective:   Physical Exam BP 120/86   Pulse 82   Temp 98.2 F (36.8 C) (Oral)   Ht 5\' 8"  (1.727 m)   Wt 200 lb (90.7 kg)   SpO2 98%   BMI 30.41 kg/m  VS noted,  Constitutional: Pt is oriented to  person, place, and time. Appears well-developed and well-nourished, in no significant distress and comfortable Head: Normocephalic and atraumatic  Eyes: Conjunctivae and EOM are normal. Pupils are equal, round, and reactive to light Right Ear: External ear normal without discharge Left Ear: External ear normal without discharge Nose: Nose without discharge or deformity Mouth/Throat: Oropharynx is without other ulcerations and moist  Neck: Normal range of motion. Neck supple. No JVD present. No tracheal deviation present or significant neck LA or mass Cardiovascular: Normal rate, regular rhythm, normal heart sounds and intact distal pulses.   Pulmonary/Chest: WOB normal and breath sounds without rales or wheezing  Abdominal: Soft. Bowel sounds are normal. NT. No HSM  Musculoskeletal: Normal range of motion. Exhibits no edema Lymphadenopathy: Has no other cervical adenopathy.  Neurological: Pt is alert and oriented to person, place, and time. Pt has normal reflexes. No cranial nerve deficit. Motor grossly intact, Gait intact Skin: Skin is warm and dry. No rash noted or new ulcerations Psychiatric:  Has normal mood and affect. Behavior is normal without agitation] No other exam findings  ECG today I have personally interpreted NSR without ischemic changes Lab Results  Component Value Date   WBC 6.5 04/30/2017   HGB 14.5 04/30/2017   HCT 41.7 04/30/2017   PLT 329 04/30/2017   GLUCOSE 108 (H) 04/29/2017   CHOL 174 10/05/2016   TRIG 232.0 (H) 10/05/2016   HDL 34.50 (L) 10/05/2016   LDLDIRECT 110.0 10/05/2016   LDLCALC 87 03/25/2015   ALT 44 04/26/2017   AST 31 04/26/2017   NA 138 04/29/2017   K 4.0 04/29/2017   CL 107 04/29/2017   CREATININE 0.89 04/29/2017   BUN <5 (L) 04/29/2017   CO2 24 04/29/2017   TSH 3.22 10/05/2016   PSA 0.49 10/05/2016   HGBA1C 5.2 12/23/2011   Pt declines further labs    Assessment & Plan:

## 2017-11-16 NOTE — Assessment & Plan Note (Signed)

## 2017-11-16 NOTE — Assessment & Plan Note (Addendum)
Atypical ecg reviewed, very low suspicion for cardiac, likely GI /reflux related - to start protonix 40 qd  In addition to the time spent performing CPE, I spent an additional 15 minutes face to face,in which greater than 50% of this time was spent in counseling and coordination of care for patient's illness as documented, including the differential dx, treatment, further evaluation and other management of chest pain, HTN

## 2017-11-16 NOTE — Assessment & Plan Note (Signed)
stable overall by history and exam, recent data reviewed with pt, and pt to continue medical treatment as before,  to f/u any worsening symptoms or concerns BP Readings from Last 3 Encounters:  11/16/17 120/86  08/17/17 122/84  06/27/17 110/82

## 2017-11-16 NOTE — Patient Instructions (Signed)
Your EKG was OK today  Please take all new medication as prescribed - the protonix 40 mg per day  Please continue all other medications as before, and refills have been done if requested.  Please have the pharmacy call with any other refills you may need.  Please continue your efforts at being more active, low cholesterol diet, and weight control.  You are otherwise up to date with prevention measures today.  Please keep your appointments with your specialists as you may have planned  Please return in 1 year for your yearly visit, or sooner if needed, with Lab testing done 3-5 days before

## 2017-11-28 ENCOUNTER — Encounter: Payer: Self-pay | Admitting: Neurology

## 2017-11-28 NOTE — Progress Notes (Signed)
[facility]  PATIENT: Nathaniel [name] DOB: 14-Nov-1965   REASON FOR VISIT: Follow-up for newly diagnosed obstructive sleep apnea with initial CPAP compliance HISTORY FROM: Patient    HISTORY OF PRESENT ILLNESS:UPDATE 2/27/2019CM Nathaniel [name], 52 year old male returns for follow-up.  Recent sleep study showed obstructive sleep apnea.  He is here for initial CPAP compliance.  Patient also wants to have his memory checked.  He feels that he has forgetfulness however looking at his CPAP compliance it is poor.  Compliance data dated 10/30/2017 to 28 2019 shows greater than 4 hours at 13% .  Average usage 2 hours 15 minutes.  Set pressure 6-15 cm EPR 3 AHI 4.1.  ESS 9.  Patient having problems with his mask fitting and he was asked to return to the equipment company for mask fitting.  He returns for reevaluation  08/17/17 CDJohn W Newman [name] [name] is a 52 y.o. male , seen here as in a referral from Dr. Jenny Reichmann for follow up on CPAP needs after almost 7 years . He has a very old CPAP now, has met his deductible and would like a new one.  Would like to quote here from the patient's evaluation in January 2009 under Dr. Lanny Hurst Clancy's guidance-  He stated that the patient had very severe obstructive sleep apnea and that the treatment options for those were limited.  A total cure could be achieved by a nasal reconstruction and UPPP mandibular surgery for treatment would be the best option.  He recommended CPAP as well.  He also stated that there were  worsening insomnia complaints.   Chief complaint according to patient : "Moderate sleep quality - it could be improved." he tries to sleep on his side. CPAP cannot be interrogated.   Sleep habits are as follows: He goes to bed around midnight, sometimes has difficulties to go to sleep but most nights does fine.  He will take an over-the-counter nonaddictive sleep aid.  Some nights he will have no bathroom breaks and other nights 1 or 2, he will  spontaneously wake up at about 6:30 AM and will be ready to start the day.  However he has only about 6 hours of dedicated time for sleep. The bedroom is cool, quiet and dark. He shares the room with his wife, sleeps on his side and on one contour pillow.  REVIEW OF SYSTEMS: Full 14 system review of systems performed and notable only for those listed, all others are neg:  Constitutional: neg  Cardiovascular: neg Ear/Nose/Throat: neg  Skin: neg Eyes: neg Respiratory: neg Gastroitestinal: neg  Hematology/Lymphatic: neg  Endocrine: neg Musculoskeletal:neg Allergy/Immunology: neg Neurological: Memory loss Psychiatric: neg Sleep : Obstructive sleep apnea with CPAP for compliance   ALLERGIES: Allergies  Allergen Reactions  . Amoxicillin-Pot Clavulanate Nausea And Vomiting    REACTION: nausea/GI upset    HOME MEDICATIONS: Outpatient Medications Prior to Visit  Medication Sig Dispense Refill  . ALPRAZolam (XANAX) 0.5 MG tablet Take 1 tablet (0.5 mg total) by mouth 2 (two) times daily as needed for anxiety. 30 tablet 0  . fluticasone (FLONASE) 50 MCG/ACT nasal spray Place 2 sprays into both nostrils daily.    Marland Kitchen losartan (COZAAR) 50 MG tablet take 1 tablet by mouth once daily 90 tablet 0  . pantoprazole (PROTONIX) 40 MG tablet Take 1 tablet (40 mg total) by mouth daily. 90 tablet 3   No facility-administered medications prior to visit.     PAST MEDICAL HISTORY: Past Medical History:  Diagnosis  Date  . ADD 05/03/2007  . ALLERGIC RHINITIS 11/25/2007  . ANXIETY 05/03/2007  . CHEST PAIN 07/28/2010  . Diverticulitis large intestine w/o perforation or abscess w/bleeding   . Diverticulosis   . 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 GERD 07/31/2010  . HYPERLIPIDEMIA 08/01/2007  . Hypertension   . INSOMNIA 10/17/2007  . Memory loss 11/23/2007  . PSA, INCREASED 01/06/2010  . SLEEP APNEA, OBSTRUCTIVE, SEVERE 05/03/2007   no cpap use    PAST SURGICAL HISTORY: Past Surgical History:    Procedure Laterality Date  . NASAL SINUS SURGERY     dr Janace Hoard  . TONSILLECTOMY      FAMILY HISTORY: Family History  Problem Relation Age of Onset  . Cancer Maternal Grandfather        prostate cancer  . Liver disease Sister   . Colon cancer Neg Hx   . Rectal cancer Neg Hx   . Stomach cancer Neg Hx     SOCIAL HISTORY: Social History   Socioeconomic History  . Marital status: Married    Spouse name: Not on file  . Number of children: Not on file  . Years of education: Not on file  . Highest education level: Not on file  Social Needs  . Financial resource strain: Not on file  . Food insecurity - worry: Not on file  . Food insecurity - inability: Not on file  . Transportation needs - medical: Not on file  . Transportation needs - non-medical: Not on file  Occupational History  . Not on file  Tobacco Use  . Smoking status: Never Smoker  . Smokeless tobacco: Never Used  Substance and Sexual Activity  . Alcohol use: Yes    Comment: socially on weekends  . Drug use: No  . Sexual activity: Not on file  Other Topics Concern  . Not on file  Social History Narrative  . Not on file     PHYSICAL EXAM  Vitals:   11/29/17 1519  BP: 118/74  Pulse: 83  Weight: 204 lb (92.5 kg)  Height: 5' 8"  (1.727 m)   Body mass index is 31.02 kg/m.  Generalized: Well developed, in no acute distress  Head: normocephalic and atraumatic,. Oropharynx benign  Neck: Supple,  Musculoskeletal: No deformity   Neurological examination   Mentation: Alert oriented to time, place, history taking. Attention span and concentration appropriate. Recent and remote memory intact.  Follows all commands speech and language fluent.  MMSE - Mini Mental State Exam 11/29/2017  Orientation to time 5  Orientation to Place 5  Registration 3  Attention/ Calculation 5  Recall 2  Language- name 2 objects 2  Language- repeat 1  Language- follow 3 step command 3  Language- read & follow direction 1   Write a sentence 1  Copy design 1  Total score 29   Cranial nerve II-XII: Pupils were equal round reactive to light extraocular movements were full, visual field were full on confrontational test. Facial sensation and strength were normal. hearing was intact to finger rubbing bilaterally. Uvula tongue midline. head turning and shoulder shrug were normal and symmetric.Tongue protrusion into cheek strength was normal. Motor: normal bulk and tone, full strength in the BUE, BLE, Sensory: normal and symmetric to light touch, Coordination: finger-nose-finger, heel-to-shin bilaterally, no dysmetria Gait and Station: Rising up from seated position without assistance, normal stance,  moderate stride, good arm swing, smooth turning, able to perform tiptoe, and heel walking without difficulty. Tandem gait is steady  DIAGNOSTIC DATA (  LABS, IMAGING, TESTING) - I reviewed patient records, labs, notes, testing and imaging myself where available.  Lab Results  Component Value Date   WBC 6.5 04/30/2017   HGB 14.5 04/30/2017   HCT 41.7 04/30/2017   MCV 84.9 04/30/2017   PLT 329 04/30/2017      Component Value Date/Time   NA 138 04/29/2017 0608   K 4.0 04/29/2017 0608   CL 107 04/29/2017 0608   CO2 24 04/29/2017 0608   GLUCOSE 108 (H) 04/29/2017 0608   BUN <5 (L) 04/29/2017 0608   CREATININE 0.89 04/29/2017 0608   CALCIUM 8.8 (L) 04/29/2017 0608   PROT 6.9 04/26/2017 2012   ALBUMIN 3.4 (L) 04/26/2017 2012   AST 31 04/26/2017 2012   ALT 44 04/26/2017 2012   ALKPHOS 70 04/26/2017 2012   BILITOT 0.7 04/26/2017 2012   GFRNONAA >60 04/29/2017 0608   GFRAA >60 04/29/2017 0608   Lab Results  Component Value Date   CHOL 174 10/05/2016   HDL 34.50 (L) 10/05/2016   LDLCALC 87 03/25/2015   LDLDIRECT 110.0 10/05/2016   TRIG 232.0 (H) 10/05/2016   CHOLHDL 5 10/05/2016     Lab Results  Component Value Date   TSH 3.22 10/05/2016      ASSESSMENT AND PLAN  52 y.o. year old male  has a past  medical history of ADD (05/03/2007), , ANXIETY (05/03/2007), , HYPERLIPIDEMIA (08/01/2007), Hypertension, INSOMNIA (10/17/2007), Memory loss (11/23/2007), and obstructive sleep apnea.  Patient is here for first CPAP compliance.Marland Kitchen  He also complains of memory loss however MMSE 29 out of 30  CPAP compliance 13% which is poor Continue same settings Go to equipment company for mask fitting Follow-up in 3 months for repeat compliance Dennie Bible, [facility], [facility], APRN  [facility] [address], [place], [place] [address] [phone]

## 2017-11-29 ENCOUNTER — Encounter: Payer: Self-pay | Admitting: Nurse Practitioner

## 2017-11-29 ENCOUNTER — Ambulatory Visit (INDEPENDENT_AMBULATORY_CARE_PROVIDER_SITE_OTHER): Payer: BLUE CROSS/BLUE SHIELD | Admitting: Nurse Practitioner

## 2017-11-29 DIAGNOSIS — G4733 Obstructive sleep apnea (adult) (pediatric): Secondary | ICD-10-CM | POA: Diagnosis not present

## 2017-11-29 DIAGNOSIS — Z9989 Dependence on other enabling machines and devices: Secondary | ICD-10-CM | POA: Diagnosis not present

## 2017-11-29 DIAGNOSIS — R413 Other amnesia: Secondary | ICD-10-CM | POA: Diagnosis not present

## 2017-11-29 NOTE — Progress Notes (Signed)
I agree with the assessment and plan as directed by NP .The patient is known to me .   Tawnya Pujol, MD  

## 2017-11-29 NOTE — Patient Instructions (Signed)
CPAP compliance 13% which is poor Continue same settings Go equipment company for fitting Follow-up in 3 months for repeat compliance

## 2017-12-20 ENCOUNTER — Telehealth: Payer: Self-pay | Admitting: Neurology

## 2017-12-20 NOTE — Telephone Encounter (Signed)
Received an email from [employer].   "We cannot obtain re-auth on this patient as he is non compliant. He will need a repeat SS and new Order to restart. Pick up ticket has been issued."  Since he has been non compliant the machine will be picked up and the patient will have to repeat the steps in order to get set back up.

## 2018-01-15 ENCOUNTER — Other Ambulatory Visit: Payer: Self-pay | Admitting: Internal Medicine

## 2018-02-27 NOTE — Progress Notes (Deleted)
[facility]  PATIENT: Nathaniel [name] DOB: [birth year]   REASON FOR VISIT: Follow-up for newly diagnosed obstructive sleep apnea with initial CPAP compliance HISTORY FROM: Patient    HISTORY OF PRESENT ILLNESS:UPDATE 2/27/2019CM Mr. Nathaniel Nickels, 52 year old male returns for follow-up.  Recent sleep study showed obstructive sleep apnea.  He is here for initial CPAP compliance.  Patient also wants to have his memory checked.  He feels that he has forgetfulness however looking at his CPAP compliance it is poor.  Compliance data dated 10/30/2017 to 28 2019 shows greater than 4 hours at 13% .  Average usage 2 hours 15 minutes.  Set pressure 6-15 cm EPR 3 AHI 4.1.  ESS 9.  Patient having problems with his mask fitting and he was asked to return to the equipment company for mask fitting.  He returns for reevaluation  08/17/17 CDJohn W Nathaniel [name] is a 52 y.o. male , seen here as in a referral from Dr. Jenny Reichmann for follow up on CPAP needs after almost 7 years . He has a very old CPAP now, has met his deductible and would like a new one.  Would like to quote here from the patient's evaluation in January 2009 under Dr. Lanny Hurst Clancy's guidance-  He stated that the patient had very severe obstructive sleep apnea and that the treatment options for those were limited.  A total cure could be achieved by a nasal reconstruction and UPPP mandibular surgery for treatment would be the best option.  He recommended CPAP as well.  He also stated that there were  worsening insomnia complaints.   Chief complaint according to patient : "Moderate sleep quality - it could be improved." he tries to sleep on his side. CPAP cannot be interrogated.   Sleep habits are as follows: He goes to bed around midnight, sometimes has difficulties to go to sleep but most nights does fine.  He will take an over-the-counter nonaddictive sleep aid.  Some nights he will have no bathroom breaks and other nights 1 or 2, he will  spontaneously wake up at about 6:30 AM and will be ready to start the day.  However he has only about 6 hours of dedicated time for sleep. The bedroom is cool, quiet and dark. He shares the room with his wife, sleeps on his side and on one contour pillow.  REVIEW OF SYSTEMS: Full 14 system review of systems performed and notable only for those listed, all others are neg:  Constitutional: neg  Cardiovascular: neg Ear/Nose/Throat: neg  Skin: neg Eyes: neg Respiratory: neg Gastroitestinal: neg  Hematology/Lymphatic: neg  Endocrine: neg Musculoskeletal:neg Allergy/Immunology: neg Neurological: Memory loss Psychiatric: neg Sleep : Obstructive sleep apnea with CPAP for compliance   ALLERGIES: Allergies  Allergen Reactions  . Amoxicillin-Pot Clavulanate Nausea And Vomiting    REACTION: nausea/GI upset    HOME MEDICATIONS: Outpatient Medications Prior to Visit  Medication Sig Dispense Refill  . ALPRAZolam (XANAX) 0.5 MG tablet Take 1 tablet (0.5 mg total) by mouth 2 (two) times daily as needed for anxiety. 30 tablet 0  . fluticasone (FLONASE) 50 MCG/ACT nasal spray Place 2 sprays into both nostrils daily.    Marland Kitchen losartan (COZAAR) 50 MG tablet TAKE 1 TABLET BY MOUTH ONCE DAILY 90 tablet 3  . pantoprazole (PROTONIX) 40 MG tablet Take 1 tablet (40 mg total) by mouth daily. 90 tablet 3   No facility-administered medications prior to visit.     PAST MEDICAL HISTORY: Past Medical History:  Diagnosis  Date  . ADD 05/03/2007  . ALLERGIC RHINITIS 11/25/2007  . ANXIETY 05/03/2007  . CHEST PAIN 07/28/2010  . Diverticulitis large intestine w/o perforation or abscess w/bleeding   . Diverticulosis   . 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 GERD 07/31/2010  . HYPERLIPIDEMIA 08/01/2007  . Hypertension   . INSOMNIA 10/17/2007  . Memory loss 11/23/2007  . PSA, INCREASED 01/06/2010  . SLEEP APNEA, OBSTRUCTIVE, SEVERE 05/03/2007   no cpap use    PAST SURGICAL HISTORY: Past Surgical History:    Procedure Laterality Date  . NASAL SINUS SURGERY     dr Janace Hoard  . TONSILLECTOMY      FAMILY HISTORY: Family History  Problem Relation Age of Onset  . Cancer Maternal Grandfather        prostate cancer  . Liver disease Sister   . Colon cancer Neg Hx   . Rectal cancer Neg Hx   . Stomach cancer Neg Hx     SOCIAL HISTORY: Social History   Socioeconomic History  . Marital status: Married    Spouse name: Not on file  . Number of children: Not on file  . Years of education: Not on file  . Highest education level: Not on file  Occupational History  . Not on file  Social Needs  . Financial resource strain: Not on file  . Food insecurity:    Worry: Not on file    Inability: Not on file  . Transportation needs:    Medical: Not on file    Non-medical: Not on file  Tobacco Use  . Smoking status: Never Smoker  . Smokeless tobacco: Never Used  Substance and Sexual Activity  . Alcohol use: Yes    Comment: socially on weekends  . Drug use: No  . Sexual activity: Not on file  Lifestyle  . Physical activity:    Days per week: Not on file    Minutes per session: Not on file  . Stress: Not on file  Relationships  . Social connections:    Talks on phone: Not on file    Gets together: Not on file    Attends religious service: Not on file    Active member of club or organization: Not on file    Attends meetings of clubs or organizations: Not on file    Relationship status: Not on file  . Intimate partner violence:    Fear of current or ex partner: Not on file    Emotionally abused: Not on file    Physically abused: Not on file    Forced sexual activity: Not on file  Other Topics Concern  . Not on file  Social History Narrative  . Not on file     PHYSICAL EXAM  There were no vitals filed for this visit. There is no height or weight on file to calculate BMI.  Generalized: Well developed, in no acute distress  Head: normocephalic and atraumatic,. Oropharynx benign   Neck: Supple,  Musculoskeletal: No deformity   Neurological examination   Mentation: Alert oriented to time, place, history taking. Attention span and concentration appropriate. Recent and remote memory intact.  Follows all commands speech and language fluent.  MMSE - Mini Mental State Exam 11/29/2017  Orientation to time 5  Orientation to Place 5  Registration 3  Attention/ Calculation 5  Recall 2  Language- name 2 objects 2  Language- repeat 1  Language- follow 3 step command 3  Language- read & follow direction 1  Write a  sentence 1  Copy design 1  Total score 29   Cranial nerve II-XII: Pupils were equal round reactive to light extraocular movements were full, visual field were full on confrontational test. Facial sensation and strength were normal. hearing was intact to finger rubbing bilaterally. Uvula tongue midline. head turning and shoulder shrug were normal and symmetric.Tongue protrusion into cheek strength was normal. Motor: normal bulk and tone, full strength in the BUE, BLE, Sensory: normal and symmetric to light touch, Coordination: finger-nose-finger, heel-to-shin bilaterally, no dysmetria Gait and Station: Rising up from seated position without assistance, normal stance,  moderate stride, good arm swing, smooth turning, able to perform tiptoe, and heel walking without difficulty. Tandem gait is steady  DIAGNOSTIC DATA (LABS, IMAGING, TESTING) - I reviewed patient records, labs, notes, testing and imaging myself where available.  Lab Results  Component Value Date   WBC 6.5 04/30/2017   HGB 14.5 04/30/2017   HCT 41.7 04/30/2017   MCV 84.9 04/30/2017   PLT 329 04/30/2017      Component Value Date/Time   NA 138 04/29/2017 0608   K 4.0 04/29/2017 0608   CL 107 04/29/2017 0608   CO2 24 04/29/2017 0608   GLUCOSE 108 (H) 04/29/2017 0608   BUN <5 (L) 04/29/2017 0608   CREATININE 0.89 04/29/2017 0608   CALCIUM 8.8 (L) 04/29/2017 0608   PROT 6.9 04/26/2017 2012    ALBUMIN 3.4 (L) 04/26/2017 2012   AST 31 04/26/2017 2012   ALT 44 04/26/2017 2012   ALKPHOS 70 04/26/2017 2012   BILITOT 0.7 04/26/2017 2012   GFRNONAA >60 04/29/2017 0608   GFRAA >60 04/29/2017 0608   Lab Results  Component Value Date   CHOL 174 10/05/2016   HDL 34.50 (L) 10/05/2016   LDLCALC 87 03/25/2015   LDLDIRECT 110.0 10/05/2016   TRIG 232.0 (H) 10/05/2016   CHOLHDL 5 10/05/2016     Lab Results  Component Value Date   TSH 3.22 10/05/2016      ASSESSMENT AND PLAN  52 y.o. year old male  has a past medical history of ADD (05/03/2007), , ANXIETY (05/03/2007), , HYPERLIPIDEMIA (08/01/2007), Hypertension, INSOMNIA (10/17/2007), Memory loss (11/23/2007), and obstructive sleep apnea.  Patient is here for first CPAP compliance.Marland Kitchen  He also complains of memory loss however MMSE 29 out of 30  CPAP compliance 13% which is poor Continue same settings Go to equipment company for mask fitting Follow-up in 3 months for repeat compliance Dennie Bible, [facility], [facility], APRN  [facility] [address] [place], [place] [address] [phone]

## 2018-02-28 ENCOUNTER — Ambulatory Visit: Payer: BLUE CROSS/BLUE SHIELD | Admitting: Nurse Practitioner

## 2018-06-11 NOTE — Progress Notes (Deleted)
[facility]  PATIENT: Nathaniel [name] DOB: [birth year]   REASON FOR VISIT: Follow-up for newly diagnosed obstructive sleep apnea with initial CPAP compliance HISTORY FROM: Patient    HISTORY OF PRESENT ILLNESS:UPDATE 2/27/2019CM Nathaniel [name], 52 year old male returns for follow-up.  Recent sleep study showed obstructive sleep apnea.  He is here for initial CPAP compliance.  Patient also wants to have his memory checked.  He feels that he has forgetfulness however looking at his CPAP compliance it is poor.  Compliance data dated 10/30/2017 to 28 2019 shows greater than 4 hours at 13% .  Average usage 2 hours 15 minutes.  Set pressure 6-15 cm EPR 3 AHI 4.1.  ESS 9.  Patient having problems with his mask fitting and he was asked to return to the equipment company for mask fitting.  He returns for reevaluation  08/17/17 CDJohn W Newman [name] [name] is a 52 y.o. male , seen here as in a referral from Dr. Jenny Reichmann for follow up on CPAP needs after almost 7 years . He has a very old CPAP now, has met his deductible and would like a new one.  Would like to quote here from the patient's evaluation in January 2009 under Dr. Lanny Hurst Clancy's guidance-  He stated that the patient had very severe obstructive sleep apnea and that the treatment options for those were limited.  A total cure could be achieved by a nasal reconstruction and UPPP mandibular surgery for treatment would be the best option.  He recommended CPAP as well.  He also stated that there were  worsening insomnia complaints.   Chief complaint according to patient : "Moderate sleep quality - it could be improved." he tries to sleep on his side. CPAP cannot be interrogated.   Sleep habits are as follows: He goes to bed around midnight, sometimes has difficulties to go to sleep but most nights does fine.  He will take an over-the-counter nonaddictive sleep aid.  Some nights he will have no bathroom breaks and other nights 1 or 2, he will  spontaneously wake up at about 6:30 AM and will be ready to start the day.  However he has only about 6 hours of dedicated time for sleep. The bedroom is cool, quiet and dark. He shares the room with his wife, sleeps on his side and on one contour pillow.  REVIEW OF SYSTEMS: Full 14 system review of systems performed and notable only for those listed, all others are neg:  Constitutional: neg  Cardiovascular: neg Ear/Nose/Throat: neg  Skin: neg Eyes: neg Respiratory: neg Gastroitestinal: neg  Hematology/Lymphatic: neg  Endocrine: neg Musculoskeletal:neg Allergy/Immunology: neg Neurological: Memory loss Psychiatric: neg Sleep : Obstructive sleep apnea with CPAP for compliance   ALLERGIES: Allergies  Allergen Reactions  . Amoxicillin-Pot Clavulanate Nausea And Vomiting    REACTION: nausea/GI upset    HOME MEDICATIONS: Outpatient Medications Prior to Visit  Medication Sig Dispense Refill  . ALPRAZolam (XANAX) 0.5 MG tablet Take 1 tablet (0.5 mg total) by mouth 2 (two) times daily as needed for anxiety. 30 tablet 0  . fluticasone (FLONASE) 50 MCG/ACT nasal spray Place 2 sprays into both nostrils daily.    Marland Kitchen losartan (COZAAR) 50 MG tablet TAKE 1 TABLET BY MOUTH ONCE DAILY 90 tablet 3  . pantoprazole (PROTONIX) 40 MG tablet Take 1 tablet (40 mg total) by mouth daily. 90 tablet 3   No facility-administered medications prior to visit.     PAST MEDICAL HISTORY: Past Medical History:  Diagnosis  Date  . ADD 05/03/2007  . ALLERGIC RHINITIS 11/25/2007  . ANXIETY 05/03/2007  . CHEST PAIN 07/28/2010  . Diverticulitis large intestine w/o perforation or abscess w/bleeding   . Diverticulosis   . 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 GERD 07/31/2010  . HYPERLIPIDEMIA 08/01/2007  . Hypertension   . INSOMNIA 10/17/2007  . Memory loss 11/23/2007  . PSA, INCREASED 01/06/2010  . SLEEP APNEA, OBSTRUCTIVE, SEVERE 05/03/2007   no cpap use    PAST SURGICAL HISTORY: Past Surgical History:    Procedure Laterality Date  . NASAL SINUS SURGERY     dr Janace Hoard  . TONSILLECTOMY      FAMILY HISTORY: Family History  Problem Relation Age of Onset  . Cancer Maternal Grandfather        prostate cancer  . Liver disease Sister   . Colon cancer Neg Hx   . Rectal cancer Neg Hx   . Stomach cancer Neg Hx     SOCIAL HISTORY: Social History   Socioeconomic History  . Marital status: Married    Spouse name: Not on file  . Number of children: Not on file  . Years of education: Not on file  . Highest education level: Not on file  Occupational History  . Not on file  Social Needs  . Financial resource strain: Not on file  . Food insecurity:    Worry: Not on file    Inability: Not on file  . Transportation needs:    Medical: Not on file    Non-medical: Not on file  Tobacco Use  . Smoking status: Never Smoker  . Smokeless tobacco: Never Used  Substance and Sexual Activity  . Alcohol use: Yes    Comment: socially on weekends  . Drug use: No  . Sexual activity: Not on file  Lifestyle  . Physical activity:    Days per week: Not on file    Minutes per session: Not on file  . Stress: Not on file  Relationships  . Social connections:    Talks on phone: Not on file    Gets together: Not on file    Attends religious service: Not on file    Active member of club or organization: Not on file    Attends meetings of clubs or organizations: Not on file    Relationship status: Not on file  . Intimate partner violence:    Fear of current or ex partner: Not on file    Emotionally abused: Not on file    Physically abused: Not on file    Forced sexual activity: Not on file  Other Topics Concern  . Not on file  Social History Narrative  . Not on file     PHYSICAL EXAM  There were no vitals filed for this visit. There is no height or weight on file to calculate BMI.  Generalized: Well developed, in no acute distress  Head: normocephalic and atraumatic,. Oropharynx benign   Neck: Supple,  Musculoskeletal: No deformity   Neurological examination   Mentation: Alert oriented to time, place, history taking. Attention span and concentration appropriate. Recent and remote memory intact.  Follows all commands speech and language fluent.  MMSE - Mini Mental State Exam 11/29/2017  Orientation to time 5  Orientation to Place 5  Registration 3  Attention/ Calculation 5  Recall 2  Language- name 2 objects 2  Language- repeat 1  Language- follow 3 step command 3  Language- read & follow direction 1  Write a  sentence 1  Copy design 1  Total score 29   Cranial nerve II-XII: Pupils were equal round reactive to light extraocular movements were full, visual field were full on confrontational test. Facial sensation and strength were normal. hearing was intact to finger rubbing bilaterally. Uvula tongue midline. head turning and shoulder shrug were normal and symmetric.Tongue protrusion into cheek strength was normal. Motor: normal bulk and tone, full strength in the BUE, BLE, Sensory: normal and symmetric to light touch, Coordination: finger-nose-finger, heel-to-shin bilaterally, no dysmetria Gait and Station: Rising up from seated position without assistance, normal stance,  moderate stride, good arm swing, smooth turning, able to perform tiptoe, and heel walking without difficulty. Tandem gait is steady  DIAGNOSTIC DATA (LABS, IMAGING, TESTING) - I reviewed patient records, labs, notes, testing and imaging myself where available.  Lab Results  Component Value Date   WBC 6.5 04/30/2017   HGB 14.5 04/30/2017   HCT 41.7 04/30/2017   MCV 84.9 04/30/2017   PLT 329 04/30/2017      Component Value Date/Time   NA 138 04/29/2017 0608   K 4.0 04/29/2017 0608   CL 107 04/29/2017 0608   CO2 24 04/29/2017 0608   GLUCOSE 108 (H) 04/29/2017 0608   BUN <5 (L) 04/29/2017 0608   CREATININE 0.89 04/29/2017 0608   CALCIUM 8.8 (L) 04/29/2017 0608   PROT 6.9 04/26/2017 2012    ALBUMIN 3.4 (L) 04/26/2017 2012   AST 31 04/26/2017 2012   ALT 44 04/26/2017 2012   ALKPHOS 70 04/26/2017 2012   BILITOT 0.7 04/26/2017 2012   GFRNONAA >60 04/29/2017 0608   GFRAA >60 04/29/2017 0608   Lab Results  Component Value Date   CHOL 174 10/05/2016   HDL 34.50 (L) 10/05/2016   LDLCALC 87 03/25/2015   LDLDIRECT 110.0 10/05/2016   TRIG 232.0 (H) 10/05/2016   CHOLHDL 5 10/05/2016     Lab Results  Component Value Date   TSH 3.22 10/05/2016      ASSESSMENT AND PLAN  52 y.o. year old male  has a past medical history of ADD (05/03/2007), , ANXIETY (05/03/2007), , HYPERLIPIDEMIA (08/01/2007), Hypertension, INSOMNIA (10/17/2007), Memory loss (11/23/2007), and obstructive sleep apnea.  Patient is here for first CPAP compliance.Marland Kitchen  He also complains of memory loss however MMSE 29 out of 30  CPAP compliance 13% which is poor Continue same settings Go to equipment company for mask fitting Follow-up in 3 months for repeat compliance Dennie Bible, [facility], [facility], APRN  [facility] [address] [place],  [address] [phone]

## 2018-06-12 ENCOUNTER — Ambulatory Visit: Payer: BLUE CROSS/BLUE SHIELD | Admitting: Nurse Practitioner

## 2018-08-10 ENCOUNTER — Ambulatory Visit: Payer: Self-pay

## 2018-08-10 NOTE — Telephone Encounter (Signed)
Third attempt to contact patient regarding his concerns over medication recall. Left message to call back.

## 2018-08-10 NOTE — Telephone Encounter (Signed)
Summary: medication question    Patient is requesting a call back from nurse to discuss whether it is safe for him to continue taking losartan, as he states he has heard a lot of conflicting information recently per media stating this medication can cause cancer. Please advise.     Left message for pt to return call to office.

## 2018-09-27 ENCOUNTER — Encounter: Payer: Self-pay | Admitting: Internal Medicine

## 2018-09-27 ENCOUNTER — Ambulatory Visit (INDEPENDENT_AMBULATORY_CARE_PROVIDER_SITE_OTHER): Payer: Self-pay | Admitting: Internal Medicine

## 2018-09-27 DIAGNOSIS — H66001 Acute suppurative otitis media without spontaneous rupture of ear drum, right ear: Secondary | ICD-10-CM

## 2018-09-27 MED ORDER — NEOMYCIN-POLYMYXIN-HC 3.5-10000-1 OT SUSP: 3.0000 [drp] | Freq: Three times a day (TID) | OTIC | 0 refills | Status: DC

## 2018-09-27 NOTE — Progress Notes (Signed)
   Subjective:   Patient ID: Nathaniel [name], male    DOB: 12-07-1965, 52 y.o.   MRN: [id number]  HPI The patient is a 52 YO man coming in for right ear pain. He is also having a cough the last 2 weeks or so. This is about the same or gradually improving. He denies fevers or chills. Not much nasal drainage except at night time when the cough is worse. Right ear with pain the last 1-2 days. Prior ear infection in that ear. He used a q-tip to clean it yesterday and got some blood which worried him. He is also having decreased hearing in that ear. Denies tinnitus. Denies SOB or change in appetite or activity level.   Review of Systems  Constitutional: Negative for activity change, appetite change, chills, fatigue, fever and unexpected weight change.  HENT: Positive for congestion, ear pain and postnasal drip. Negative for ear discharge, rhinorrhea, sinus pressure, sinus pain, sneezing, sore throat, tinnitus, trouble swallowing and voice change.   Eyes: Negative.   Respiratory: Positive for cough. Negative for chest tightness, shortness of breath and wheezing.   Cardiovascular: Negative.   Gastrointestinal: Negative.   Musculoskeletal: Negative.   Neurological: Negative.     Objective:  Physical Exam Constitutional:      Appearance: He is well-developed.  HENT:     Head: Normocephalic and atraumatic.     Comments: Oropharynx with redness and clear drainage, nose with swollen turbinates, TM normal left    Left Ear: Tympanic membrane, ear canal and external ear normal.     Ears:     Comments: Right ear with TM bulging clear fluid, some scrape on the ear canal with trace blood Neck:     Musculoskeletal: Normal range of motion.     Thyroid: No thyromegaly.  Cardiovascular:     Rate and Rhythm: Normal rate and regular rhythm.  Pulmonary:     Effort: Pulmonary effort is normal. No respiratory distress.     Breath sounds: Normal breath sounds. No wheezing or rales.  Abdominal:   Palpations: Abdomen is soft.  Lymphadenopathy:     Cervical: No cervical adenopathy.  Skin:    General: Skin is warm and dry.  Neurological:     Mental Status: He is alert and oriented to person, place, and time.     Vitals:   09/27/18 1105  BP: 110/76  Pulse: 85  Temp: 97.8 F (36.6 C)  TempSrc: Oral  SpO2: 96%  Weight: 210 lb (95.3 kg)  Height: 5\' 8"  (1.727 m)    Assessment & Plan:

## 2018-09-27 NOTE — Assessment & Plan Note (Signed)
Rx for cortisporin ear drops to clear this and encouraged to keep taking flonase and start zyrtec for 1-2 weeks.

## 2018-09-27 NOTE — Patient Instructions (Signed)
We have sent in ear drops to use for the right ear. Use 3 drops 3 times a day for 3 days.

## 2018-10-01 ENCOUNTER — Ambulatory Visit: Payer: Self-pay

## 2018-10-01 NOTE — Telephone Encounter (Signed)
Pt seen 09/27/18 by Dr. Sharlet Salina; "Otitis media right ear." States symptoms remain; "No worse, just never got any better." Denies any new symptoms, afebrile.  States Dr. Sharlet Salina instructed him to [facility] if no improvement and she would call in antibiotic.  States has been using prescribed ear drops. Also using Flonase, has not started Zyrtec. If appropriate:  [facility] [place].  Please advise:  [phone]

## 2018-10-02 MED ORDER — AZITHROMYCIN 250 MG PO TABS: ORAL_TABLET | ORAL | 0 refills | Status: DC

## 2018-10-02 NOTE — Telephone Encounter (Signed)
Patient informed. 

## 2018-10-02 NOTE — Telephone Encounter (Signed)
Sent in antibiotic 

## 2018-10-02 NOTE — Addendum Note (Signed)
Addended by: Hoyt Koch on: 10/02/2018 08:37 AM   Modules accepted: Orders

## 2018-10-23 DIAGNOSIS — M25561 Pain in right knee: Secondary | ICD-10-CM | POA: Insufficient documentation

## 2019-02-11 ENCOUNTER — Other Ambulatory Visit: Payer: Self-pay | Admitting: *Deleted

## 2019-02-11 MED ORDER — LOSARTAN POTASSIUM 50 MG PO TABS: 50.0000 mg | ORAL_TABLET | Freq: Every day | ORAL | 0 refills | Status: DC

## 2019-04-03 ENCOUNTER — Ambulatory Visit: Payer: Self-pay | Admitting: Neurology

## 2019-06-14 DIAGNOSIS — S83511A Sprain of anterior cruciate ligament of right knee, initial encounter: Secondary | ICD-10-CM | POA: Insufficient documentation

## 2019-08-23 DIAGNOSIS — F331 Major depressive disorder, recurrent, moderate: Secondary | ICD-10-CM | POA: Insufficient documentation

## 2019-11-27 ENCOUNTER — Ambulatory Visit (INDEPENDENT_AMBULATORY_CARE_PROVIDER_SITE_OTHER): Payer: 59 | Admitting: Gastroenterology

## 2019-11-27 ENCOUNTER — Encounter: Payer: Self-pay | Admitting: Gastroenterology

## 2019-11-27 ENCOUNTER — Other Ambulatory Visit (INDEPENDENT_AMBULATORY_CARE_PROVIDER_SITE_OTHER): Payer: 59

## 2019-11-27 ENCOUNTER — Encounter: Payer: Self-pay | Admitting: *Deleted

## 2019-11-27 VITALS — BP 116/60 | HR 83 | Temp 97.9°F | Ht 68.0 in | Wt 217.0 lb

## 2019-11-27 DIAGNOSIS — R1084 Generalized abdominal pain: Secondary | ICD-10-CM

## 2019-11-27 DIAGNOSIS — R140 Abdominal distension (gaseous): Secondary | ICD-10-CM | POA: Insufficient documentation

## 2019-11-27 LAB — BASIC METABOLIC PANEL
BUN: 11 mg/dL (ref 6–23)
CO2: 27 mEq/L (ref 19–32)
Calcium: 9.9 mg/dL (ref 8.4–10.5)
Chloride: 102 mEq/L (ref 96–112)
Creatinine, Ser: 0.97 mg/dL (ref 0.40–1.50)
GFR: 80.08 mL/min (ref >60.00)
Glucose, Bld: 101 mg/dL — ABNORMAL HIGH (ref 70–99)
Potassium: 4 mEq/L (ref 3.5–5.1)
Sodium: 139 mEq/L (ref 135–145)

## 2019-11-27 NOTE — Progress Notes (Signed)
11/27/2019 Nathaniel [name] [id number] 15-Aug-1966   HISTORY OF PRESENT ILLNESS: This is a 54 year old male who is a patient of Dr. Blanch Media.  He is here today with complaints of generalized abdominal pain and bloating.  He says that this has been present for at least the past month.  He says that it is not extreme pain, but just his whole entire abdomen feels very tight and uncomfortable almost like a pressure.  He says that it is constant.  He says that he moves his bowels well without any issues.  He denies any nausea, vomiting, fevers, chills.  He did have diverticulitis once in 2018, but he says that this does not feel like diverticulitis pain.  He says that he has never had this type of discomfort previously.  He says that he just wants to make sure that everything is okay in there because it is not going away.  Symptoms are independent of eating.  He says that they are present upon waking in the morning.  He had colonoscopy Dr. Henrene Pastor in September 2015 at which time he was only found to have some diverticulosis with recommended recall in 10 years.   Past Medical History:  Diagnosis Date  . ADD 05/03/2007  . ALLERGIC RHINITIS 11/25/2007  . ANXIETY 05/03/2007  . CHEST PAIN 07/28/2010  . Diverticulitis large intestine w/o perforation or abscess w/bleeding   . Diverticulosis   . 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 GERD 07/31/2010  . HYPERLIPIDEMIA 08/01/2007  . Hypertension   . INSOMNIA 10/17/2007  . Memory loss 11/23/2007  . PSA, INCREASED 01/06/2010  . SLEEP APNEA, OBSTRUCTIVE, SEVERE 05/03/2007   no cpap use   Past Surgical History:  Procedure Laterality Date  . NASAL SINUS SURGERY     dr Janace Hoard  . TONSILLECTOMY      reports that he has never smoked. He has never used smokeless tobacco. He reports current alcohol use. He reports that he does not use drugs. family history includes Cancer in his maternal grandfather; Liver disease in his sister. Allergies  Allergen Reactions   . Amoxicillin-Pot Clavulanate Nausea And Vomiting    REACTION: nausea/GI upset      Outpatient Encounter Medications as of 11/27/2019  Medication Sig  . ALPRAZolam (XANAX) 0.5 MG tablet Take 1 tablet (0.5 mg total) by mouth 2 (two) times daily as needed for anxiety.  . fluticasone (FLONASE) 50 MCG/ACT nasal spray Place into both nostrils daily.  . [DISCONTINUED] fluticasone (FLONASE) 50 MCG/ACT nasal spray Place 2 sprays into both nostrils daily.  . [DISCONTINUED] losartan (COZAAR) 50 MG tablet Take 1 tablet (50 mg total) by mouth daily. Needs appointment for future refills.  . [DISCONTINUED] neomycin-polymyxin-hydrocortisone (CORTISPORIN) 3.5-10000-1 OTIC suspension Place 3 drops into the right ear 3 (three) times daily.  . [DISCONTINUED] pantoprazole (PROTONIX) 40 MG tablet Take 1 tablet (40 mg total) by mouth daily.  . [DISCONTINUED] azithromycin (ZITHROMAX) 250 MG tablet Day 1 take 2 pills, days 2-5 take 1 pill daily   No facility-administered encounter medications on file as of 11/27/2019.     REVIEW OF SYSTEMS  : All other systems reviewed and negative except where noted in the History of Present Illness.   PHYSICAL EXAM: BP 116/60   Pulse 83   Temp 97.9 F (36.6 C)   Ht 5\' 8"  (1.727 m)   Wt 217 lb (98.4 kg)   BMI 32.99 kg/m  General: Well developed white male in no acute distress Head: Normocephalic and  atraumatic Eyes:  Sclerae anicteric, conjunctiva pink. Ears: Normal auditory acuity Lungs: Clear throughout to auscultation.  No increased WOB. Heart: Regular rate and rhythm; no M/R/G. Abdomen: Soft, non-distended.  BS present.  Minimal diffuse TTP. Musculoskeletal: Symmetrical with no gross deformities  Skin: No lesions on visible extremities Extremities: No edema  Neurological: Alert oriented x 4, grossly non-focal Psychological:  Alert and cooperative. Normal mood and affect  ASSESSMENT AND PLAN: *Generalized abdominal pain and bloating: Present for at least the  past month.  Constant discomfort.  Never had any similar symptoms of this in the past.  Really no other associated symptoms.  He is concerned and wants to make sure that everything is okay.  We will plan for CT scan of the abdomen pelvis with contrast.  We will check BMP today for renal function.  Further recommendations will be made pending results.   CC:  Biagio Borg, MD

## 2019-11-27 NOTE — Patient Instructions (Signed)
If you are age 56 or older, your body mass index should be between 23-30. Your Body mass index is 32.99 kg/m. If this is out of the aforementioned range listed, please consider follow up with your Primary Care Provider.  If you are age 14 or younger, your body mass index should be between 19-25. Your Body mass index is 32.99 kg/m. If this is out of the aformentioned range listed, please consider follow up with your Primary Care Provider.   Your provider has requested that you go to the basement level for lab work before leaving today. Press "B" on the elevator. The lab is located at the first door on the left as you exit the elevator.  You have been scheduled for a CT scan of the abdomen and pelvis at [facility] ([address]---this is in the same building as [employer]).   You are scheduled on ____ at _____. You should arrive 15 minutes prior to your appointment time for registration. Please follow the written instructions below on the day of your exam:  WARNING: IF YOU ARE ALLERGIC TO IODINE/X-RAY DYE, PLEASE NOTIFY RADIOLOGY IMMEDIATELY AT [phone]! YOU WILL BE GIVEN A 13 HOUR PREMEDICATION PREP.  1) Do not eat or drink anything 4 hours prior to your test 2) You have been given 2 bottles of oral contrast to drink. The solution may taste better if refrigerated, but do NOT add ice or any other liquid to this solution. Shake well before drinking.    Drink 1 bottle of contrast 2 hours prior to your exam  Drink 1 bottle of contrast 1 hour prior to your exam  You may take any medications as prescribed with a small amount of water, if necessary. If you take any of the following medications: METFORMIN, GLUCOPHAGE, GLUCOVANCE, AVANDAMET, RIOMET, FORTAMET, [place] MET, JANUMET, GLUMETZA or METAGLIP, you MAY be asked to HOLD this medication 48 hours AFTER the exam.  The purpose of you drinking the oral contrast is to aid in the visualization of your intestinal tract.  The contrast solution may cause some diarrhea. Depending on your individual set of symptoms, you may also receive an intravenous injection of x-ray contrast/dye. Plan on being at [facility] for 30 minutes or longer, depending on the type of exam you are having performed.  This test typically takes 30-45 minutes to complete.  If you have any questions regarding your exam or if you need to reschedule, you may call the CT department at [phone] between the hours of 8:00 am and 5:00 pm, Monday-Friday.  ___________________________________________________________

## 2019-11-27 NOTE — Progress Notes (Signed)
Assessment and plans of the GI physician assistant noted

## 2019-12-05 ENCOUNTER — Ambulatory Visit (INDEPENDENT_AMBULATORY_CARE_PROVIDER_SITE_OTHER)
Admission: RE | Admit: 2019-12-05 | Discharge: 2019-12-05 | Disposition: A | Discharge status: Home / Self Care | Payer: 59 | Source: Ambulatory Visit | Attending: Gastroenterology | Admitting: Gastroenterology

## 2019-12-05 ENCOUNTER — Other Ambulatory Visit: Payer: Self-pay

## 2019-12-05 DIAGNOSIS — R1084 Generalized abdominal pain: Secondary | ICD-10-CM

## 2019-12-05 DIAGNOSIS — R140 Abdominal distension (gaseous): Secondary | ICD-10-CM

## 2019-12-05 MED ORDER — IOHEXOL 300 MG/ML  SOLN
100.0000 mL | Freq: Once | INTRAMUSCULAR | Status: AC | PRN
  Administered 2019-12-05: 100 mL via INTRAVENOUS

## 2019-12-10 ENCOUNTER — Telehealth: Payer: Self-pay | Admitting: Gastroenterology

## 2019-12-10 NOTE — Telephone Encounter (Signed)
Pt requested to speak to RN regarding CT.

## 2019-12-10 NOTE — Telephone Encounter (Signed)
I spoke with the pt and advised him that the report was faxed on 3/5 at 4:22 pm and was refaxed today.  He will call that office now.

## 2019-12-11 ENCOUNTER — Encounter: Payer: Self-pay | Admitting: Adult Health

## 2019-12-12 ENCOUNTER — Telehealth: Payer: Self-pay | Admitting: Neurology

## 2019-12-12 NOTE — Telephone Encounter (Signed)
Pt called wanting to discuss medication alternatives and requested a CB from RN to discuss

## 2019-12-12 NOTE — Telephone Encounter (Signed)
Called the patient back. Pt was concerned that ever since he saw Dr Brett Fairy he was taking unisom to help him sleep through the night. He is concerned that he should not be taking that for long term and wanted to discuss if something else could be tried to help with his sleep. I asked the patient if he is still established with [place], cause in looking at the chart it appears that the patient has been following in [facility] for his sleep related concerns and advised that we had not seen him since 2019. Pt stated due to insurance change he had to switch to [facility] and now insurance has changed again and he is able to come back to our practice. Patient is still currently using CPAP. Advised the patient that we would not be able to prescribe a medication for him until we follow up with him. I advised the patient our NP had an opening on Monday and was able to schedule a "yearly" CPAP follow up visit for supplies ordered and discuss sleep related concerns and medication changes. Pt verbalized understanding.

## 2019-12-16 ENCOUNTER — Encounter: Payer: Self-pay | Admitting: Adult Health

## 2019-12-16 ENCOUNTER — Other Ambulatory Visit: Payer: Self-pay

## 2019-12-16 ENCOUNTER — Encounter: Payer: Self-pay | Admitting: Internal Medicine

## 2019-12-16 ENCOUNTER — Ambulatory Visit: Payer: 59 | Admitting: Adult Health

## 2019-12-16 VITALS — BP 120/84 | HR 90 | Temp 96.8°F | Ht 68.0 in | Wt 218.8 lb

## 2019-12-16 DIAGNOSIS — Z9989 Dependence on other enabling machines and devices: Secondary | ICD-10-CM

## 2019-12-16 DIAGNOSIS — G4733 Obstructive sleep apnea (adult) (pediatric): Secondary | ICD-10-CM | POA: Diagnosis not present

## 2019-12-16 DIAGNOSIS — G479 Sleep disorder, unspecified: Secondary | ICD-10-CM | POA: Diagnosis not present

## 2019-12-16 NOTE — Progress Notes (Signed)
New orders in place for Nathaniel [name] [birth year]b 04/11/2066.  Thanks SY

## 2019-12-16 NOTE — Progress Notes (Signed)
PATIENT: Nathaniel [name] DOB: [birth year]  REASON FOR VISIT: follow up HISTORY FROM: patient  HISTORY OF PRESENT ILLNESS: Today 12/16/19: Nathaniel [name] is a 54 year old male with a history of obstructive sleep apnea on CPAP.  His download indicates that he uses machine nightly for compliance of 100%.  He uses machine greater than 4 hours 27 days for compliance of 90%.  He uses machine on average 6 hours and 23 minutes.  His residual AHI is 6.8 on 6 to 15 cm of water with EPR 3.  Leak in the 95th percentile is 11.8 L/min.  Reports that he uses Unisom every night.  He is concerned if this will cause any long-term effects.  HISTORY UPDATE 2/27/2019CM Nathaniel [name], 54 year old male returns for follow-up.  Recent sleep study showed obstructive sleep apnea.  He is here for initial CPAP compliance.  Patient also wants to have his memory checked.  He feels that he has forgetfulness however looking at his CPAP compliance it is poor.  Compliance data dated 10/30/2017 to 28 2019 shows greater than 4 hours at 13% .  Average usage 2 hours 15 minutes.  Set pressure 6-15 cm EPR 3 AHI 4.1.  ESS 9.  Patient having problems with his mask fitting and he was asked to return to the equipment company for mask fitting.  He returns for reevaluation   REVIEW OF SYSTEMS: Out of a complete 14 system review of symptoms, the patient complains only of the following symptoms, and all other reviewed systems are negative.   ESS  6  ALLERGIES: Allergies  Allergen Reactions  . Amoxicillin-Pot Clavulanate Nausea And Vomiting    REACTION: nausea/GI upset    HOME MEDICATIONS: Outpatient Medications Prior to Visit  Medication Sig Dispense Refill  . ALPRAZolam (XANAX) 0.5 MG tablet Take 1 tablet (0.5 mg total) by mouth 2 (two) times daily as needed for anxiety. 30 tablet 0  . fluticasone (FLONASE) 50 MCG/ACT nasal spray Place into both nostrils daily.    Marland Kitchen losartan (COZAAR) 50 MG tablet TAKE ONE TABLET BY MOUTH DAILY     No  facility-administered medications prior to visit.    PAST MEDICAL HISTORY: Past Medical History:  Diagnosis Date  . ADD 05/03/2007  . ALLERGIC RHINITIS 11/25/2007  . ANXIETY 05/03/2007  . CHEST PAIN 07/28/2010  . Diverticulitis large intestine w/o perforation or abscess w/bleeding   . Diverticulosis   . 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 GERD 07/31/2010  . HYPERLIPIDEMIA 08/01/2007  . Hypertension   . INSOMNIA 10/17/2007  . Memory loss 11/23/2007  . PSA, INCREASED 01/06/2010  . SLEEP APNEA, OBSTRUCTIVE, SEVERE 05/03/2007   no cpap use    PAST SURGICAL HISTORY: Past Surgical History:  Procedure Laterality Date  . NASAL SINUS SURGERY     dr Janace Hoard  . TONSILLECTOMY      FAMILY HISTORY: Family History  Problem Relation Age of Onset  . Cancer Maternal Grandfather        prostate cancer  . Liver disease Sister   . Colon cancer Neg Hx   . Rectal cancer Neg Hx   . Stomach cancer Neg Hx     SOCIAL HISTORY: Social History   Socioeconomic History  . Marital status: Married    Spouse name: Not on file  . Number of children: Not on file  . Years of education: Not on file  . Highest education level: Not on file  Occupational History  . Not on file  Tobacco Use  .  Smoking status: Never Smoker  . Smokeless tobacco: Never Used  Substance and Sexual Activity  . Alcohol use: Yes    Comment: socially on weekends  . Drug use: No  . Sexual activity: Not on file  Other Topics Concern  . Not on file  Social History Narrative  . Not on file   Social Determinants of Health   Financial Resource Strain:   . Difficulty of Paying Living Expenses:   Food Insecurity:   . Worried About Charity fundraiser in the Last Year:   . Arboriculturist in the Last Year:   Transportation Needs:   . Film/video editor (Medical):   Marland Kitchen Lack of Transportation (Non-Medical):   Physical Activity:   . Days of Exercise per Week:   . Minutes of Exercise per Session:   Stress:   . Feeling  of Stress :   Social Connections:   . Frequency of Communication with Friends and Family:   . Frequency of Social Gatherings with Friends and Family:   . Attends Religious Services:   . Active Member of Clubs or Organizations:   . Attends Archivist Meetings:   Marland Kitchen Marital Status:   Intimate Partner Violence:   . Fear of Current or Ex-Partner:   . Emotionally Abused:   Marland Kitchen Physically Abused:   . Sexually Abused:       PHYSICAL EXAM  Vitals:   12/16/19 1131  BP: 120/84  Pulse: 90  Temp: (!) 96.8 F (36 C)  SpO2: 96%  Weight: 218 lb 12.8 oz (99.2 kg)  Height: 5\' 8"  (1.727 m)   Body mass index is 33.27 kg/m.  Generalized: Well developed, in no acute distress  Chest: Lungs clear to auscultation bilaterally  Neurological examination  Mentation: Alert oriented to time, place, history taking. Follows all commands speech and language fluent Cranial nerve II-XII: Extraocular movements were full, visual field were full on confrontational test Head turning and shoulder shrug  were normal and symmetric. Motor: The motor testing reveals 5 over 5 strength of all 4 extremities. Good symmetric motor tone is noted throughout.  Sensory: Sensory testing is intact to soft touch on all 4 extremities. No evidence of extinction is noted.  Gait and station: Gait is normal.    DIAGNOSTIC DATA (LABS, IMAGING, TESTING) - I reviewed patient records, labs, notes, testing and imaging myself where available.  Lab Results  Component Value Date   WBC 6.5 04/30/2017   HGB 14.5 04/30/2017   HCT 41.7 04/30/2017   MCV 84.9 04/30/2017   PLT 329 04/30/2017      Component Value Date/Time   NA 139 11/27/2019 1159   K 4.0 11/27/2019 1159   CL 102 11/27/2019 1159   CO2 27 11/27/2019 1159   GLUCOSE 101 (H) 11/27/2019 1159   BUN 11 11/27/2019 1159   CREATININE 0.97 11/27/2019 1159   CALCIUM 9.9 11/27/2019 1159   PROT 6.9 04/26/2017 2012   ALBUMIN 3.4 (L) 04/26/2017 2012   AST 31 04/26/2017  2012   ALT 44 04/26/2017 2012   ALKPHOS 70 04/26/2017 2012   BILITOT 0.7 04/26/2017 2012   GFRNONAA >60 04/29/2017 0608   GFRAA >60 04/29/2017 0608   Lab Results  Component Value Date   CHOL 174 10/05/2016   HDL 34.50 (L) 10/05/2016   LDLCALC 87 03/25/2015   LDLDIRECT 110.0 10/05/2016   TRIG 232.0 (H) 10/05/2016   CHOLHDL 5 10/05/2016   Lab Results  Component Value Date  HGBA1C 5.2 12/23/2011   No results found for: VITAMINB12 Lab Results  Component Value Date   TSH 3.22 10/05/2016      ASSESSMENT AND PLAN 54 y.o. year old male  has a past medical history of ADD (05/03/2007), ALLERGIC RHINITIS (11/25/2007), ANXIETY (05/03/2007), CHEST PAIN (07/28/2010), Diverticulitis large intestine w/o perforation or abscess w/bleeding, Diverticulosis, ERECTILE DYSFUNCTION, ORGANIC (01/06/2010), GERD (07/31/2010), HYPERLIPIDEMIA (08/01/2007), Hypertension, INSOMNIA (10/17/2007), Memory loss (11/23/2007), PSA, INCREASED (01/06/2010), and SLEEP APNEA, OBSTRUCTIVE, SEVERE (05/03/2007). here with:  1. OSA on CPAP  - CPAP compliance excellent - Good treatment of AHI  - Encourage patient to use CPAP nightly and > 4 hours each night - F/U in 1 year or sooner if needed  2.  Sleep disturbance  -Currently using Unisom-advised that he could reduce his dose to 1 tablet to see if it is beneficial also advised that he could try taking melatonin on the weekend instead of Unisom.   I spent 15 minutes of face-to-face and non-face-to-face time with patient.  This included previsit chart review, lab review, study review, order entry, electronic health record documentation, patient education.  Ward Givens, MSN, NP-C 12/16/2019, 11:42 AM [facility] [address], [place], [place] [address] [phone]

## 2019-12-16 NOTE — Telephone Encounter (Signed)
FYI - rx was refused.

## 2019-12-16 NOTE — Patient Instructions (Addendum)
Your Plan:  Continue using CPAP nightly  Order sent to increase pressure If your symptoms worsen or you develop new symptoms please let us know.   Please call [employer] at [phone], and press option 1 when prompted. Their customer service representatives will be glad to assist you. If they are unable to answer, please leave a message and they will call you back. Make sure to leave your name and return phone number.    Thank you for coming to see [place] at [facility]. I hope we have been able to provide you high quality care today.  You may receive a patient satisfaction survey over the next few weeks. We would appreciate your feedback and comments so that we may continue to improve ourselves and the health of our patients.

## 2019-12-24 ENCOUNTER — Encounter: Payer: 59 | Admitting: Internal Medicine

## 2020-01-15 ENCOUNTER — Ambulatory Visit: Payer: Self-pay | Admitting: Neurology

## 2020-04-28 ENCOUNTER — Other Ambulatory Visit: Payer: Self-pay

## 2020-04-28 ENCOUNTER — Encounter (HOSPITAL_BASED_OUTPATIENT_CLINIC_OR_DEPARTMENT_OTHER): Payer: Self-pay | Admitting: Orthopedic Surgery

## 2020-04-29 ENCOUNTER — Encounter (HOSPITAL_BASED_OUTPATIENT_CLINIC_OR_DEPARTMENT_OTHER): Admission: RE | Payer: Self-pay | Source: Home / Self Care

## 2020-04-29 ENCOUNTER — Ambulatory Visit (HOSPITAL_BASED_OUTPATIENT_CLINIC_OR_DEPARTMENT_OTHER): Admission: RE | Admit: 2020-04-29 | Payer: 59 | Source: Home / Self Care | Admitting: Orthopaedic Surgery

## 2020-04-29 SURGERY — OPEN REDUCTION INTERNAL FIXATION (ORIF) ANKLE FRACTURE
Anesthesia: Choice | Site: Ankle | Laterality: Right

## 2020-04-29 NOTE — H&P (Signed)
Patient's anticipated LOS is less than 2 midnights, meeting these requirements: - Younger than 27 - Lives within 1 hour of care - Has a competent adult at home to recover with post-op recover - NO history of  - Chronic pain requiring opiods  - Diabetes  - Coronary Artery Disease  - Heart failure  - Heart attack  - Stroke  - DVT/VTE  - Cardiac arrhythmia  - Respiratory Failure/COPD  - Renal failure  - Anemia  - Advanced Liver disease       Nathaniel [name] is an 54 y.o. male.    Chief Complaint: right ankle pain  HPI: Pt is a 54 y.o. male complaining of right ankle pain due to recent injury. Pain had continually increased since the beginning. X-rays in the clinic show distal fibula fracture with displacement.  Various options are discussed with the patient. Risks, benefits and expectations were discussed with the patient. Patient understand the risks, benefits and expectations and wishes to proceed with surgery.   PCP:  Biagio Borg, MD  D/C Plans: Home  PMH: Past Medical History:  Diagnosis Date  . ADD 05/03/2007  . ALLERGIC RHINITIS 11/25/2007  . ANXIETY 05/03/2007  . CHEST PAIN 07/28/2010  . Closed right ankle fracture   . Diverticulitis large intestine w/o perforation or abscess w/bleeding   . Diverticulosis   . 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 GERD 07/31/2010  . HYPERLIPIDEMIA 08/01/2007  . Hypertension   . INSOMNIA 10/17/2007  . Memory loss 11/23/2007  . PSA, INCREASED 01/06/2010  . SLEEP APNEA, OBSTRUCTIVE, SEVERE 05/03/2007   uses CPAP nightly    PSH: Past Surgical History:  Procedure Laterality Date  . NASAL SINUS SURGERY     dr Janace Hoard  . TONSILLECTOMY      Social History:  reports that he has never smoked. He has never used smokeless tobacco. He reports current alcohol use. He reports that he does not use drugs.  Allergies:  Allergies  Allergen Reactions  . Amoxicillin-Pot Clavulanate Nausea And Vomiting    REACTION: nausea/GI upset     Medications: No current facility-administered medications for this encounter.   Current Outpatient Medications  Medication Sig Dispense Refill  . fluticasone (FLONASE) 50 MCG/ACT nasal spray Place into both nostrils daily.    Marland Kitchen losartan (COZAAR) 50 MG tablet TAKE ONE TABLET BY MOUTH DAILY      No results found for this or any previous visit (from the past 107 hour(s)). No results found.  ROS: Pain with rom of the right lower extremity  Physical Exam: Alert and oriented 54 y.o. male in no acute distress Cranial nerves 2-12 intact Cervical spine: full rom with no tenderness, nv intact distally Chest: active breath sounds bilaterally, no wheeze rhonchi or rales Heart: regular rate and rhythm, no murmur Abd: non tender non distended with active bowel sounds Hip is stable with rom  Right ankle with mild edema Splint in place nv intact distally No signs of open wound or fracture blisters  Assessment/Plan Assessment: right distal fibula fracture  Plan:  Patient will undergo a right ankle ORIF by Dr. Veverly Fells at [facility] Risks benefits and expectations were discussed with the patient. Patient understand risks, benefits and expectations and wishes to proceed. Preoperative templating of the joint replacement has been completed, documented, and submitted to the Operating Room personnel in order to optimize intra-operative equipment management.   Brad Renn Dirocco PA-C, MPAS [employer] is now [employer]  [address],  [place], [place] [address] Phone: [phone] [email or web address] Facebook  [employer]

## 2020-04-30 ENCOUNTER — Encounter (HOSPITAL_BASED_OUTPATIENT_CLINIC_OR_DEPARTMENT_OTHER)
Admission: RE | Admit: 2020-04-30 | Discharge: 2020-04-30 | Disposition: A | Discharge status: Home / Self Care | Payer: 59 | Source: Ambulatory Visit | Attending: Orthopedic Surgery | Admitting: Orthopedic Surgery

## 2020-04-30 ENCOUNTER — Other Ambulatory Visit (HOSPITAL_COMMUNITY)
Admission: RE | Admit: 2020-04-30 | Discharge: 2020-04-30 | Disposition: A | Discharge status: Home / Self Care | Payer: 59 | Source: Ambulatory Visit | Attending: Orthopedic Surgery | Admitting: Orthopedic Surgery

## 2020-04-30 ENCOUNTER — Other Ambulatory Visit: Payer: Self-pay

## 2020-04-30 DIAGNOSIS — Z01818 Encounter for other preprocedural examination: Secondary | ICD-10-CM | POA: Insufficient documentation

## 2020-04-30 DIAGNOSIS — Z20822 Contact with and (suspected) exposure to covid-19: Secondary | ICD-10-CM | POA: Insufficient documentation

## 2020-04-30 LAB — SARS CORONAVIRUS 2 (TAT 6-24 HRS): SARS Coronavirus 2: NEGATIVE

## 2020-04-30 NOTE — Progress Notes (Signed)

## 2020-05-04 ENCOUNTER — Ambulatory Visit (HOSPITAL_BASED_OUTPATIENT_CLINIC_OR_DEPARTMENT_OTHER): Payer: 59 | Admitting: Certified Registered"

## 2020-05-04 ENCOUNTER — Encounter (HOSPITAL_BASED_OUTPATIENT_CLINIC_OR_DEPARTMENT_OTHER): Payer: Self-pay | Admitting: Orthopedic Surgery

## 2020-05-04 ENCOUNTER — Other Ambulatory Visit: Payer: Self-pay

## 2020-05-04 ENCOUNTER — Encounter (HOSPITAL_BASED_OUTPATIENT_CLINIC_OR_DEPARTMENT_OTHER)
Admission: RE | Disposition: A | Discharge status: Home / Self Care | Payer: Self-pay | Source: Home / Self Care | Attending: Orthopedic Surgery

## 2020-05-04 ENCOUNTER — Ambulatory Visit (HOSPITAL_BASED_OUTPATIENT_CLINIC_OR_DEPARTMENT_OTHER)
Admission: RE | Admit: 2020-05-04 | Discharge: 2020-05-04 | Disposition: A | Discharge status: Home / Self Care | Payer: 59 | Attending: Orthopedic Surgery | Admitting: Orthopedic Surgery

## 2020-05-04 DIAGNOSIS — I10 Essential (primary) hypertension: Secondary | ICD-10-CM | POA: Diagnosis not present

## 2020-05-04 DIAGNOSIS — X58XXXA Exposure to other specified factors, initial encounter: Secondary | ICD-10-CM | POA: Insufficient documentation

## 2020-05-04 DIAGNOSIS — G4733 Obstructive sleep apnea (adult) (pediatric): Secondary | ICD-10-CM | POA: Diagnosis not present

## 2020-05-04 DIAGNOSIS — Z79899 Other long term (current) drug therapy: Secondary | ICD-10-CM | POA: Diagnosis not present

## 2020-05-04 DIAGNOSIS — S82831A Other fracture of upper and lower end of right fibula, initial encounter for closed fracture: Secondary | ICD-10-CM | POA: Diagnosis present

## 2020-05-04 HISTORY — PX: ORIF ANKLE FRACTURE: SHX5408

## 2020-05-04 HISTORY — DX: Other fracture of right lower leg, initial encounter for closed fracture: S82.891A

## 2020-05-04 SURGERY — OPEN REDUCTION INTERNAL FIXATION (ORIF) ANKLE FRACTURE
Anesthesia: General | Site: Ankle | Laterality: Right

## 2020-05-04 MED ORDER — CLINDAMYCIN PHOSPHATE 900 MG/50ML IV SOLN: 900.0000 mg | INTRAVENOUS | Status: DC

## 2020-05-04 MED ORDER — MIDAZOLAM HCL 2 MG/2ML IJ SOLN
2.0000 mg | Freq: Once | INTRAMUSCULAR | Status: AC
  Administered 2020-05-04: 2 mg via INTRAVENOUS

## 2020-05-04 MED ORDER — FENTANYL CITRATE (PF) 100 MCG/2ML IJ SOLN
INTRAMUSCULAR | Status: AC
  Filled 2020-05-04: qty 2

## 2020-05-04 MED ORDER — METHOCARBAMOL 500 MG PO TABS: 500.0000 mg | ORAL_TABLET | Freq: Three times a day (TID) | ORAL | 1 refills | Status: DC | PRN

## 2020-05-04 MED ORDER — FENTANYL CITRATE (PF) 100 MCG/2ML IJ SOLN: 25.0000 ug | INTRAMUSCULAR | Status: DC | PRN

## 2020-05-04 MED ORDER — LIDOCAINE 2% (20 MG/ML) 5 ML SYRINGE
INTRAMUSCULAR | Status: AC
  Filled 2020-05-04: qty 5

## 2020-05-04 MED ORDER — PHENYLEPHRINE HCL (PRESSORS) 10 MG/ML IV SOLN
INTRAVENOUS | Status: DC | PRN
  Administered 2020-05-04 (×2): 40 ug via INTRAVENOUS
  Administered 2020-05-04: 120 ug via INTRAVENOUS
  Administered 2020-05-04 (×2): 80 ug via INTRAVENOUS

## 2020-05-04 MED ORDER — CLINDAMYCIN PHOSPHATE 900 MG/50ML IV SOLN
INTRAVENOUS | Status: AC
  Filled 2020-05-04: qty 50

## 2020-05-04 MED ORDER — ONDANSETRON HCL 4 MG/2ML IJ SOLN
INTRAMUSCULAR | Status: AC
  Filled 2020-05-04: qty 2

## 2020-05-04 MED ORDER — LIDOCAINE HCL (CARDIAC) PF 100 MG/5ML IV SOSY
PREFILLED_SYRINGE | INTRAVENOUS | Status: DC | PRN
  Administered 2020-05-04: 100 mg via INTRAVENOUS

## 2020-05-04 MED ORDER — CLINDAMYCIN PHOSPHATE 900 MG/50ML IV SOLN
INTRAVENOUS | Status: DC | PRN
Start: 2020-05-04 — End: 2020-05-04
  Administered 2020-05-04: 900 mg via INTRAVENOUS

## 2020-05-04 MED ORDER — ONDANSETRON HCL 4 MG PO TABS: 4.0000 mg | ORAL_TABLET | Freq: Three times a day (TID) | ORAL | 0 refills | Status: AC | PRN

## 2020-05-04 MED ORDER — CEFAZOLIN SODIUM-DEXTROSE 2-4 GM/100ML-% IV SOLN: 2.0000 g | INTRAVENOUS | Status: DC

## 2020-05-04 MED ORDER — FENTANYL CITRATE (PF) 100 MCG/2ML IJ SOLN
100.0000 ug | Freq: Once | INTRAMUSCULAR | Status: AC
  Administered 2020-05-04: 100 ug via INTRAVENOUS

## 2020-05-04 MED ORDER — DEXAMETHASONE SODIUM PHOSPHATE 10 MG/ML IJ SOLN
INTRAMUSCULAR | Status: DC | PRN
  Administered 2020-05-04: 5 mg via INTRAVENOUS

## 2020-05-04 MED ORDER — LACTATED RINGERS IV SOLN: INTRAVENOUS | Status: DC | PRN

## 2020-05-04 MED ORDER — PROPOFOL 10 MG/ML IV BOLUS
INTRAVENOUS | Status: DC | PRN
  Administered 2020-05-04: 200 mg via INTRAVENOUS

## 2020-05-04 MED ORDER — DEXAMETHASONE SODIUM PHOSPHATE 4 MG/ML IJ SOLN
INTRAMUSCULAR | Status: DC | PRN
Start: 2020-05-04 — End: 2020-05-04
  Administered 2020-05-04: 8 mg via INTRAVENOUS

## 2020-05-04 MED ORDER — DEXAMETHASONE SODIUM PHOSPHATE 10 MG/ML IJ SOLN
INTRAMUSCULAR | Status: AC
  Filled 2020-05-04: qty 1

## 2020-05-04 MED ORDER — BUPIVACAINE-EPINEPHRINE (PF) 0.5% -1:200000 IJ SOLN
INTRAMUSCULAR | Status: DC | PRN
Start: 2020-05-04 — End: 2020-05-04
  Administered 2020-05-04: 30 mL

## 2020-05-04 MED ORDER — CLONIDINE HCL (ANALGESIA) 100 MCG/ML EP SOLN
EPIDURAL | Status: DC | PRN
  Administered 2020-05-04: 100 ug

## 2020-05-04 MED ORDER — MIDAZOLAM HCL 2 MG/2ML IJ SOLN
INTRAMUSCULAR | Status: DC | PRN
  Administered 2020-05-04: 2 mg via INTRAVENOUS

## 2020-05-04 MED ORDER — PHENYLEPHRINE 40 MCG/ML (10ML) SYRINGE FOR IV PUSH (FOR BLOOD PRESSURE SUPPORT)
PREFILLED_SYRINGE | INTRAVENOUS | Status: AC
  Filled 2020-05-04: qty 30

## 2020-05-04 MED ORDER — MIDAZOLAM HCL 2 MG/2ML IJ SOLN
INTRAMUSCULAR | Status: AC
  Filled 2020-05-04: qty 2

## 2020-05-04 MED ORDER — PROMETHAZINE HCL 25 MG/ML IJ SOLN: 6.2500 mg | INTRAMUSCULAR | Status: DC | PRN

## 2020-05-04 MED ORDER — MEPERIDINE HCL 25 MG/ML IJ SOLN: 6.2500 mg | INTRAMUSCULAR | Status: DC | PRN

## 2020-05-04 MED ORDER — OXYCODONE-ACETAMINOPHEN 5-325 MG PO TABS: 1.0000 | ORAL_TABLET | ORAL | 0 refills | Status: AC | PRN

## 2020-05-04 MED ORDER — LACTATED RINGERS IV SOLN: INTRAVENOUS | Status: DC

## 2020-05-04 SURGICAL SUPPLY — 74 items
APL SKNCLS STERI-STRIP NONHPOA (GAUZE/BANDAGES/DRESSINGS)
BAG DECANTER FOR FLEXI CONT (MISCELLANEOUS) IMPLANT
BANDAGE ESMARK 6X9 LF (GAUZE/BANDAGES/DRESSINGS) ×1 IMPLANT
BENZOIN TINCTURE PRP APPL 2/3 (GAUZE/BANDAGES/DRESSINGS) IMPLANT
BIT DRILL 2.5X2.75 QC CALB (BIT) ×2 IMPLANT
BIT DRILL 3.5X5.5 QC CALB (BIT) ×2 IMPLANT
BLADE SURG 15 STRL LF DISP TIS (BLADE) ×2 IMPLANT
BLADE SURG 15 STRL SS (BLADE) ×4
BNDG CMPR 9X6 STRL LF SNTH (GAUZE/BANDAGES/DRESSINGS) ×1
BNDG ELASTIC 4X5.8 VLCR STR LF (GAUZE/BANDAGES/DRESSINGS) ×2 IMPLANT
BNDG ELASTIC 6X5.8 VLCR STR LF (GAUZE/BANDAGES/DRESSINGS) ×2 IMPLANT
BNDG ESMARK 6X9 LF (GAUZE/BANDAGES/DRESSINGS) ×2
BNDG GAUZE ELAST 4 BULKY (GAUZE/BANDAGES/DRESSINGS) ×2 IMPLANT
COVER BACK TABLE 60X90IN (DRAPES) ×2 IMPLANT
COVER MAYO STAND STRL (DRAPES) ×2 IMPLANT
COVER WAND RF STERILE (DRAPES) IMPLANT
CUFF TOURN SGL QUICK 34 (TOURNIQUET CUFF) ×2
CUFF TRNQT CYL 34X4.125X (TOURNIQUET CUFF) ×1 IMPLANT
DECANTER SPIKE VIAL GLASS SM (MISCELLANEOUS) IMPLANT
DRAPE EXTREMITY T 121X128X90 (DISPOSABLE) ×2 IMPLANT
DRAPE IMP U-DRAPE 54X76 (DRAPES) ×2 IMPLANT
DRAPE OEC MINIVIEW 54X84 (DRAPES) ×2 IMPLANT
DRAPE SURG 17X23 STRL (DRAPES) ×2 IMPLANT
DRAPE U-SHAPE 47X51 STRL (DRAPES) ×2 IMPLANT
DURAPREP 26ML APPLICATOR (WOUND CARE) ×2 IMPLANT
ELECT REM PT RETURN 9FT ADLT (ELECTROSURGICAL) ×2
ELECTRODE REM PT RTRN 9FT ADLT (ELECTROSURGICAL) ×1 IMPLANT
GAUZE XEROFORM 1X8 LF (GAUZE/BANDAGES/DRESSINGS) ×2 IMPLANT
GLOVE BIO SURGEON STRL SZ7 (GLOVE) ×2 IMPLANT
GLOVE BIOGEL M 6.5 STRL (GLOVE) ×2 IMPLANT
GLOVE BIOGEL PI IND STRL 7.5 (GLOVE) ×1 IMPLANT
GLOVE BIOGEL PI IND STRL 8.5 (GLOVE) ×1 IMPLANT
GLOVE BIOGEL PI INDICATOR 7.5 (GLOVE) ×1
GLOVE BIOGEL PI INDICATOR 8.5 (GLOVE) ×1
GLOVE SURG ORTHO 8.0 STRL STRW (GLOVE) ×2 IMPLANT
GOWN STRL REUS W/ TWL LRG LVL3 (GOWN DISPOSABLE) ×2 IMPLANT
GOWN STRL REUS W/ TWL XL LVL3 (GOWN DISPOSABLE) ×1 IMPLANT
GOWN STRL REUS W/TWL LRG LVL3 (GOWN DISPOSABLE) ×4
GOWN STRL REUS W/TWL XL LVL3 (GOWN DISPOSABLE) ×2
NEEDLE HYPO 25X1 1.5 SAFETY (NEEDLE) IMPLANT
NS IRRIG 1000ML POUR BTL (IV SOLUTION) ×2 IMPLANT
PACK BASIN DAY SURGERY FS (CUSTOM PROCEDURE TRAY) ×2 IMPLANT
PAD CAST 4YDX4 CTTN HI CHSV (CAST SUPPLIES) ×1 IMPLANT
PADDING CAST ABS 4INX4YD NS (CAST SUPPLIES)
PADDING CAST ABS COTTON 4X4 ST (CAST SUPPLIES) IMPLANT
PADDING CAST COTTON 4X4 STRL (CAST SUPPLIES) ×2
PENCIL SMOKE EVACUATOR (MISCELLANEOUS) ×2 IMPLANT
PLATE ACE 100DEG 7HOLE (Plate) ×2 IMPLANT
SCREW CORTICAL 3.5MM  16MM (Screw) ×2 IMPLANT
SCREW CORTICAL 3.5MM 14MM (Screw) ×6 IMPLANT
SCREW CORTICAL 3.5MM 16MM (Screw) ×1 IMPLANT
SCREW CORTICAL 3.5MM 18MM (Screw) ×4 IMPLANT
SHEET MEDIUM DRAPE 40X70 STRL (DRAPES) IMPLANT
SPLINT FAST PLASTER 5X30 (CAST SUPPLIES) ×20
SPLINT PLASTER CAST FAST 5X30 (CAST SUPPLIES) ×20 IMPLANT
STAPLER VISISTAT 35W (STAPLE) ×2 IMPLANT
STOCKINETTE 6  STRL (DRAPES) ×2
STOCKINETTE 6 STRL (DRAPES) ×1 IMPLANT
STOCKINETTE TUBULAR 6 INCH (GAUZE/BANDAGES/DRESSINGS) ×2 IMPLANT
STRIP CLOSURE SKIN 1/2X4 (GAUZE/BANDAGES/DRESSINGS) IMPLANT
SUCTION FRAZIER HANDLE 10FR (MISCELLANEOUS) ×2
SUCTION TUBE FRAZIER 10FR DISP (MISCELLANEOUS) ×1 IMPLANT
SUT MNCRL AB 4-0 PS2 18 (SUTURE) IMPLANT
SUT VIC AB 0 CT1 27 (SUTURE) ×2
SUT VIC AB 0 CT1 27XBRD ANBCTR (SUTURE) ×1 IMPLANT
SUT VIC AB 2-0 CT1 (SUTURE) ×2 IMPLANT
SUT VIC AB 3-0 SH 27 (SUTURE)
SUT VIC AB 3-0 SH 27X BRD (SUTURE) IMPLANT
SYR BULB EAR ULCER 3OZ GRN STR (SYRINGE) ×2 IMPLANT
SYR CONTROL 10ML LL (SYRINGE) IMPLANT
TOWEL GREEN STERILE FF (TOWEL DISPOSABLE) ×2 IMPLANT
TUBE CONNECTING 20X1/4 (TUBING) ×2 IMPLANT
UNDERPAD 30X36 HEAVY ABSORB (UNDERPADS AND DIAPERS) ×2 IMPLANT
YANKAUER SUCT BULB TIP NO VENT (SUCTIONS) ×2 IMPLANT

## 2020-05-04 NOTE — Anesthesia Postprocedure Evaluation (Signed)
Anesthesia Post Note  Patient: Nathaniel [name]  Procedure(s) Performed: OPEN REDUCTION INTERNAL FIXATION (ORIF) ANKLE FRACTURE (Right Ankle)     Patient location during evaluation: PACU Anesthesia Type: General Level of consciousness: sedated and patient cooperative Pain management: pain level controlled Vital Signs Assessment: post-procedure vital signs reviewed and stable Respiratory status: spontaneous breathing Cardiovascular status: stable Anesthetic complications: no   No complications documented.  Last Vitals:  Vitals:   05/04/20 1330 05/04/20 1349  BP: 100/73 118/83  Pulse: 78 86  Resp: (!) 7 18  Temp:  36.4 C  SpO2: 99% 99%    Last Pain:  Vitals:   05/04/20 1349  TempSrc: Oral  PainSc: 0-No pain                 Nolon Nations

## 2020-05-04 NOTE — Anesthesia Preprocedure Evaluation (Addendum)
Anesthesia Evaluation  Patient identified by MRN, date of birth, ID band Patient awake    Reviewed: Allergy & Precautions, NPO status , Patient's Chart, lab work & pertinent test results  Airway Mallampati: II  TM Distance: >3 FB Neck ROM: Full    Dental  (+) Dental Advisory Given, Teeth Intact   Pulmonary sleep apnea ,    Pulmonary exam normal breath sounds clear to auscultation       Cardiovascular hypertension, Pt. on medications Normal cardiovascular exam Rhythm:Regular Rate:Normal     Neuro/Psych PSYCHIATRIC DISORDERS Anxiety negative neurological ROS     GI/Hepatic Neg liver ROS, GERD  ,  Endo/Other  negative endocrine ROS  Renal/GU negative Renal ROS     Musculoskeletal negative musculoskeletal ROS (+)   Abdominal (+) + obese,   Peds  Hematology negative hematology ROS (+)   Anesthesia Other Findings   Reproductive/Obstetrics                            Anesthesia Physical Anesthesia Plan  ASA: II  Anesthesia Plan: General   Post-op Pain Management: GA combined w/ Regional for post-op pain   Induction: Intravenous  PONV Risk Score and Plan: 2 and Ondansetron, Midazolam and Treatment may vary due to age or medical condition  Airway Management Planned: LMA  Additional Equipment: None  Intra-op Plan:   Post-operative Plan: Extubation in OR  Informed Consent: I have reviewed the patients History and Physical, chart, labs and discussed the procedure including the risks, benefits and alternatives for the proposed anesthesia with the patient or authorized representative who has indicated his/her understanding and acceptance.     Dental advisory given  Plan Discussed with: CRNA  Anesthesia Plan Comments:       Anesthesia Quick Evaluation

## 2020-05-04 NOTE — Interval H&P Note (Signed)
History and Physical Interval Note:  05/04/2020 11:00 AM  Nathaniel [name]  has presented today for surgery, with the diagnosis of Right ankle fracture.  The various methods of treatment have been discussed with the patient and family. After consideration of risks, benefits and other options for treatment, the patient has consented to  Procedure(s): OPEN REDUCTION INTERNAL FIXATION (ORIF) ANKLE FRACTURE (Right) as a surgical intervention.  The patient's history has been reviewed, patient examined, no change in status, stable for surgery.  I have reviewed the patient's chart and labs.  Questions were answered to the patient's satisfaction.     [name]

## 2020-05-04 NOTE — Anesthesia Procedure Notes (Signed)
Procedure Name: LMA Insertion Performed by: Aasiya Creasey M, CRNA Pre-anesthesia Checklist: Patient identified, Emergency Drugs available, Suction available and Patient being monitored Patient Re-evaluated:Patient Re-evaluated prior to induction Oxygen Delivery Method: Circle system utilized Preoxygenation: Pre-oxygenation with 100% oxygen Induction Type: IV induction Ventilation: Mask ventilation without difficulty LMA: LMA inserted LMA Size: 4.0 Number of attempts: 1 Airway Equipment and Method: Bite block Placement Confirmation: positive ETCO2 Tube secured with: Tape Dental Injury: Teeth and Oropharynx as per pre-operative assessment        

## 2020-05-04 NOTE — Discharge Instructions (Signed)
Elevate your leg constantly keeping your right foot above your waist as much as possible.  This helps with swelling and pain.   Keep the splint covered and clean and dry.  Do toe wiggling exercise as instructed every hour, please to prevent blood clots.  Take baby aspirin 81 mg chewable twice daily for 30 days to prevent blood clots   Use the walker while you are up and around  And place NO WEIGHT on the right foot.  May use knee scooter once your balance has returned.   Follow up with Dr Veverly Fells in two weeks in the office, call [phone] for appt and for any questions     Post Anesthesia Home Care Instructions  Activity: Get plenty of rest for the remainder of the day. A responsible individual must stay with you for 24 hours following the procedure.  For the next 24 hours, DO NOT: -Drive a car -Paediatric nurse -Drink alcoholic beverages -Take any medication unless instructed by your physician -Make any legal decisions or sign important papers.  Meals: Start with liquid foods such as gelatin or soup. Progress to regular foods as tolerated. Avoid greasy, spicy, heavy foods. If nausea and/or vomiting occur, drink only clear liquids until the nausea and/or vomiting subsides. Call your physician if vomiting continues.  Special Instructions/Symptoms: Your throat may feel dry or sore from the anesthesia or the breathing tube placed in your throat during surgery. If this causes discomfort, gargle with warm salt water. The discomfort should disappear within 24 hours.  If you had a scopolamine patch placed behind your ear for the management of post- operative nausea and/or vomiting:  1. The medication in the patch is effective for 72 hours, after which it should be removed.  Wrap patch in a tissue and discard in the trash. Wash hands thoroughly with soap and water. 2. You may remove the patch earlier than 72 hours if you experience unpleasant side effects which may include dry mouth,  dizziness or visual disturbances. 3. Avoid touching the patch. Wash your hands with soap and water after contact with the patch.    Regional Anesthesia Blocks  1. Numbness or the inability to move the "blocked" extremity may last from 3-48 hours after placement. The length of time depends on the medication injected and your individual response to the medication. If the numbness is not going away after 48 hours, call your surgeon.  2. The extremity that is blocked will need to be protected until the numbness is gone and the  Strength has returned. Because you cannot feel it, you will need to take extra care to avoid injury. Because it may be weak, you may have difficulty moving it or using it. You may not know what position it is in without looking at it while the block is in effect.  3. For blocks in the legs and feet, returning to weight bearing and walking needs to be done carefully. You will need to wait until the numbness is entirely gone and the strength has returned. You should be able to move your leg and foot normally before you try and bear weight or walk. You will need someone to be with you when you first try to ensure you do not fall and possibly risk injury.  4. Bruising and tenderness at the needle site are common side effects and will resolve in a few days.  5. Persistent numbness or new problems with movement should be communicated to the surgeon or the [facility]  [place] [phone] [place] [phone]).

## 2020-05-04 NOTE — Transfer of Care (Signed)
Immediate Anesthesia Transfer of Care Note  Patient: Nathaniel [name]  Procedure(s) Performed: OPEN REDUCTION INTERNAL FIXATION (ORIF) ANKLE FRACTURE (Right Ankle)  Patient Location: PACU  Anesthesia Type:General  Level of Consciousness: awake, alert  and oriented  Airway & Oxygen Therapy: Patient Spontanous Breathing and Patient connected to face mask oxygen  Post-op Assessment: Report given to RN and Post -op Vital signs reviewed and stable  Post vital signs: Reviewed and stable  Last Vitals:  Vitals Value Taken Time  BP    Temp    Pulse    Resp    SpO2      Last Pain:  Vitals:   05/04/20 0901  TempSrc: Oral  PainSc: 2       Patients Stated Pain Goal: 5 (55/01/58 6825)  Complications: No complications documented.

## 2020-05-04 NOTE — Anesthesia Procedure Notes (Signed)
Anesthesia Regional Block: Popliteal block   Pre-Anesthetic Checklist: ,, timeout performed, Correct Patient, Correct Site, Correct Laterality, Correct Procedure, Correct Position, site marked, Risks and benefits discussed,  Surgical consent,  Pre-op evaluation,  At surgeon's request and post-op pain management  Laterality: Right  Prep: chloraprep       Needles:  Injection technique: Single-shot  Needle Type: Stimiplex     Needle Length: 10cm  Needle Gauge: 21     Additional Needles:   Procedures:,,,, ultrasound used (permanent image in chart),,,,  Motor weakness within 5 minutes.   Nerve Stimulator or Paresthesia:  Response: 0.5 mA,   Additional Responses:   Narrative:  Start time: 05/04/2020 10:35 AM End time: 05/04/2020 10:40 AM Injection made incrementally with aspirations every 5 mL.  Performed by: Personally  Anesthesiologist: Nolon Nations, MD  Additional Notes: Nerve located and needle positioned with direct ultrasound guidance. Good perineural spread. Patient tolerated well.

## 2020-05-04 NOTE — Op Note (Signed)
NAME: Nathaniel [name], Nathaniel [name] MEDICAL RECORD [id number] ACCOUNT [id number] DATE OF BIRTH:01-27-1966 FACILITY: [facility] LOCATION: [facility] PHYSICIAN:STEVEN Orlena Sheldon, MD  OPERATIVE REPORT  DATE OF PROCEDURE:  05/04/2020  PREOPERATIVE DIAGNOSIS:  Right displaced distal fibula fracture.  POSTOPERATIVE DIAGNOSIS:  Right displaced distal fibula fracture.  PROCEDURE PERFORMED:  Right ankle open reduction internal fixation of displaced fibular fracture using Biomet small fragment plate and screws.  ATTENDING SURGEON:  Esmond Plants, MD  ASSISTANT:  Darol Destine, [place], who was scrubbed during the entire procedure and necessary for satisfactory completion of surgery.  ANESTHESIA:  General anesthesia was used plus a popliteal block.  ESTIMATED BLOOD LOSS:  Minimal.  FLUID REPLACEMENT:  1000 mL crystalloid.  INSTRUMENT COUNTS:  Correct.  COMPLICATIONS:  No complications.  ANTIBIOTICS:  Perioperative antibiotics were given.  TOURNIQUET TIME:  56 minutes at 300 mmHg.  INDICATIONS:  The patient is a 54 year old male who presents with a history of a twisting injury to his right ankle.  The patient complained of immediate pain and swelling and inability to bear weight.  The patient presented to urgent care where x-rays  were obtained demonstrating a displaced ankle mortise and an unstable Weber B fibular fracture.  The patient was placed in a splint and then presented to orthopedics for surgical consultation.  We discussed options for management, recommending surgery  based on this fracture being rotated and short and also the ankle mortise being wide.  Risks including but not limited to nerve damage and infection were discussed with the patient.  Informed consent obtained.  DESCRIPTION OF PROCEDURE:  After an adequate level of anesthesia was achieved, the patient was positioned in the supine position.  Nonsterile tourniquet placed on the right proximal thigh.  Right leg sterilely prepped and  draped in the usual manner.   Time-out called, verifying correct patient, correct site.  We elevated the leg and exsanguinated the leg with an Esmarch bandage, elevating the tourniquet to 300 mmHg.  A longitudinal incision over the distal fibula using a #15 blade scalpel, dissection  down through subcutaneous tissues to the periosteum, which was incised and subperiosteal dissection of the fracture site was performed.  Fracture was grossly displaced and shortened.  We went ahead and cleaned out the hematoma from the fracture site and  then using crab claw clamps x2, we reduced the fracture with a distal pole and distal translation of the distal fragment.  We were able to get the fracture anatomically aligned, which reduced the ankle mortise.  C-arm was draped into the field and was  utilized in a multiplanar fashion to evaluate the ankle mortise and the fracture alignment.  Once we verified with our fracture alignment, we placed an anterior to posterior lag screw using AO technique with the appropriately length 3.5 mm cortical  screw.  Next, we placed a 7-hole one-third tubular plate on the lateral side of the fibula to serve as a neutralization plate.  We placed 2 fully threaded cortical screws distally and then 3 bicortical screws proximally.  This gave excellent purchase and  alignment of the plate and screw construct, which will protect the interfrag screw.  We irrigated thoroughly and closed in layers with 0 Vicryl for the deep muscular and fascial layer, followed by 2-0 Vicryl for subcutaneous closure and staples for  skin.  Sterile compressive bandage and a short-leg splint was applied.  Tourniquet deflated at 56 minutes.  The patient was transported to recovery room in stable condition.  VN/NUANCE  D:05/04/2020 T:05/04/2020  JOB:[id number]

## 2020-05-04 NOTE — Brief Op Note (Signed)
05/04/2020  1:24 PM  PATIENT:  Nathaniel [name]  54 y.o. male  PRE-OPERATIVE DIAGNOSIS:  Right displaced ankle fracture  POST-OPERATIVE DIAGNOSIS:  Right displaced ankle fracture  PROCEDURE:  Procedure(s): OPEN REDUCTION INTERNAL FIXATION (ORIF) ANKLE FRACTURE (Right) Biomet small frag plate and screws  SURGEON:  Surgeon(s) and Role:    Netta Cedars, MD - Primary  PHYSICIAN ASSISTANT:   ASSISTANTS: [place] Bruns, PA-C   ANESTHESIA:   regional and general  EBL:  15 mL   BLOOD ADMINISTERED:none  DRAINS: none   LOCAL MEDICATIONS USED:  NONE  SPECIMEN:  No Specimen  DISPOSITION OF SPECIMEN:  N/A  COUNTS:  YES  TOURNIQUET:   Total Tourniquet Time Documented: Thigh (Right) - 56 minutes Total: Thigh (Right) - 56 minutes   DICTATION: .Other Dictation: Dictation Number [id number]  PLAN OF CARE: Discharge to home after PACU  PATIENT DISPOSITION:  PACU - hemodynamically stable.   Delay start of Pharmacological VTE agent (>24hrs) due to surgical blood loss or risk of bleeding: no

## 2020-05-04 NOTE — Progress Notes (Signed)
Assisted Dr. Germeroth with right, ultrasound guided, popliteal block. Side rails up, monitors on throughout procedure. See vital signs in flow sheet. Tolerated Procedure well. 

## 2020-05-05 ENCOUNTER — Encounter (HOSPITAL_BASED_OUTPATIENT_CLINIC_OR_DEPARTMENT_OTHER): Payer: Self-pay | Admitting: Orthopedic Surgery

## 2020-05-25 ENCOUNTER — Encounter: Payer: Self-pay | Admitting: Internal Medicine

## 2020-06-24 DIAGNOSIS — S8263XA Displaced fracture of lateral malleolus of unspecified fibula, initial encounter for closed fracture: Secondary | ICD-10-CM | POA: Insufficient documentation

## 2020-07-06 DIAGNOSIS — M25571 Pain in right ankle and joints of right foot: Secondary | ICD-10-CM | POA: Insufficient documentation

## 2020-07-08 ENCOUNTER — Other Ambulatory Visit: Payer: Self-pay

## 2020-07-08 ENCOUNTER — Other Ambulatory Visit (HOSPITAL_COMMUNITY): Payer: Self-pay | Admitting: Orthopedic Surgery

## 2020-07-08 ENCOUNTER — Other Ambulatory Visit (HOSPITAL_COMMUNITY): Payer: Self-pay | Admitting: Internal Medicine

## 2020-07-08 ENCOUNTER — Ambulatory Visit (HOSPITAL_COMMUNITY)
Admission: RE | Admit: 2020-07-08 | Discharge: 2020-07-08 | Disposition: A | Discharge status: Home / Self Care | Payer: 59 | Source: Ambulatory Visit | Attending: Cardiology | Admitting: Cardiology

## 2020-07-08 DIAGNOSIS — M79604 Pain in right leg: Secondary | ICD-10-CM | POA: Diagnosis present

## 2020-07-28 ENCOUNTER — Telehealth: Payer: Self-pay | Admitting: Internal Medicine

## 2020-07-28 ENCOUNTER — Other Ambulatory Visit: Payer: Self-pay

## 2020-07-28 MED ORDER — LOSARTAN POTASSIUM 50 MG PO TABS: 50.0000 mg | ORAL_TABLET | Freq: Every day | ORAL | 1 refills | Status: DC

## 2020-07-28 NOTE — Telephone Encounter (Signed)
losartan (COZAAR) 50 MG tablet Last seen-12/04/19 Patient requesting a refill  [facility] [address] [place] [address] Phone:  [phone]  Fax:  [phone]

## 2020-08-14 ENCOUNTER — Encounter: Payer: 59 | Admitting: Internal Medicine

## 2020-08-24 ENCOUNTER — Other Ambulatory Visit: Payer: Self-pay

## 2020-08-24 ENCOUNTER — Encounter: Payer: Self-pay | Admitting: Internal Medicine

## 2020-08-24 ENCOUNTER — Ambulatory Visit (INDEPENDENT_AMBULATORY_CARE_PROVIDER_SITE_OTHER): Payer: 59 | Admitting: Internal Medicine

## 2020-08-24 VITALS — BP 118/74 | HR 96 | Temp 98.3°F | Ht 68.0 in | Wt 213.8 lb

## 2020-08-24 DIAGNOSIS — E7849 Other hyperlipidemia: Secondary | ICD-10-CM | POA: Diagnosis not present

## 2020-08-24 DIAGNOSIS — I10 Essential (primary) hypertension: Secondary | ICD-10-CM

## 2020-08-24 DIAGNOSIS — Z0000 Encounter for general adult medical examination without abnormal findings: Secondary | ICD-10-CM

## 2020-08-24 DIAGNOSIS — N529 Male erectile dysfunction, unspecified: Secondary | ICD-10-CM

## 2020-08-24 DIAGNOSIS — Z23 Encounter for immunization: Secondary | ICD-10-CM | POA: Diagnosis not present

## 2020-08-24 DIAGNOSIS — E538 Deficiency of other specified B group vitamins: Secondary | ICD-10-CM

## 2020-08-24 DIAGNOSIS — Z125 Encounter for screening for malignant neoplasm of prostate: Secondary | ICD-10-CM | POA: Diagnosis not present

## 2020-08-24 DIAGNOSIS — E559 Vitamin D deficiency, unspecified: Secondary | ICD-10-CM

## 2020-08-24 DIAGNOSIS — R739 Hyperglycemia, unspecified: Secondary | ICD-10-CM | POA: Insufficient documentation

## 2020-08-24 LAB — CBC WITH DIFFERENTIAL/PLATELET
Basophils Absolute: 0.1 10*3/uL (ref 0.0–0.1)
Basophils Relative: 0.8 % (ref 0.0–3.0)
Eosinophils Absolute: 0.1 10*3/uL (ref 0.0–0.7)
Eosinophils Relative: 1.3 % (ref 0.0–5.0)
HCT: 46.7 % (ref 39.0–52.0)
Hemoglobin: 16.5 g/dL (ref 13.0–17.0)
Lymphocytes Relative: 19.9 % (ref 12.0–46.0)
Lymphs Abs: 1.5 10*3/uL (ref 0.7–4.0)
MCHC: 35.2 g/dL (ref 30.0–36.0)
MCV: 88.5 fl (ref 78.0–100.0)
Monocytes Absolute: 0.9 10*3/uL (ref 0.1–1.0)
Monocytes Relative: 12.1 % — ABNORMAL HIGH (ref 3.0–12.0)
Neutro Abs: 4.9 10*3/uL (ref 1.4–7.7)
Neutrophils Relative %: 65.9 % (ref 43.0–77.0)
Platelets: 253 10*3/uL (ref 150.0–400.0)
RBC: 5.28 Mil/uL (ref 4.22–5.81)
RDW: 13.2 % (ref 11.5–15.5)
WBC: 7.5 10*3/uL (ref 4.0–10.5)

## 2020-08-24 LAB — LIPID PANEL
Cholesterol: 157 mg/dL (ref 0–200)
HDL: 39.5 mg/dL (ref >39.00)
LDL Cholesterol: 87 mg/dL (ref 0–99)
NonHDL: 117.27
Total CHOL/HDL Ratio: 4
Triglycerides: 150 mg/dL — ABNORMAL HIGH (ref 0.0–149.0)
VLDL: 30 mg/dL (ref 0.0–40.0)

## 2020-08-24 LAB — URINALYSIS, ROUTINE W REFLEX MICROSCOPIC
Bilirubin Urine: NEGATIVE
Hgb urine dipstick: NEGATIVE
Ketones, ur: NEGATIVE
Leukocytes,Ua: NEGATIVE
Nitrite: NEGATIVE
RBC / HPF: NONE SEEN (ref 0–?)
Specific Gravity, Urine: 1.01 (ref 1.000–1.030)
Total Protein, Urine: NEGATIVE
Urine Glucose: NEGATIVE
Urobilinogen, UA: 0.2 (ref 0.0–1.0)
pH: 7.5 (ref 5.0–8.0)

## 2020-08-24 LAB — HEPATIC FUNCTION PANEL
ALT: 22 U/L (ref 0–53)
AST: 15 U/L (ref 0–37)
Albumin: 4.6 g/dL (ref 3.5–5.2)
Alkaline Phosphatase: 85 U/L (ref 39–117)
Bilirubin, Direct: 0.1 mg/dL (ref 0.0–0.3)
Total Bilirubin: 0.8 mg/dL (ref 0.2–1.2)
Total Protein: 7.1 g/dL (ref 6.0–8.3)

## 2020-08-24 LAB — HEMOGLOBIN A1C: Hgb A1c MFr Bld: 5.2 % (ref 4.6–6.5)

## 2020-08-24 LAB — TSH: TSH: 1.91 u[IU]/mL (ref 0.35–4.50)

## 2020-08-24 LAB — VITAMIN B12: Vitamin B-12: 273 pg/mL (ref 211–911)

## 2020-08-24 LAB — PSA: PSA: 0.94 ng/mL (ref 0.10–4.00)

## 2020-08-24 LAB — TESTOSTERONE: Testosterone: 196.06 ng/dL — ABNORMAL LOW (ref 300.00–890.00)

## 2020-08-24 LAB — VITAMIN D 25 HYDROXY (VIT D DEFICIENCY, FRACTURES): VITD: 28.7 ng/mL — ABNORMAL LOW (ref 30.00–100.00)

## 2020-08-24 NOTE — Progress Notes (Addendum)
Subjective:    Patient ID: Nathaniel [name], male    DOB: Nov 03, 1965, 54 y.o.   MRN: [id number]  HPI  Here for wellness and f/u;  Overall doing ok;  Pt denies Chest pain, worsening SOB, DOE, wheezing, orthopnea, PND, worsening LE edema, palpitations, dizziness or syncope.  Pt denies neurological change such as new headache, facial or extremity weakness.  Pt denies polydipsia, polyuria, or low sugar symptoms. Pt states overall good compliance with treatment and medications, good tolerability, and has been trying to follow appropriate diet.  Pt denies worsening depressive symptoms, suicidal ideation or panic. No fever, night sweats, wt loss, loss of appetite, or other constitutional symptoms.  Pt states good ability with ADL's, has low fall risk, home safety reviewed and adequate, no other significant changes in hearing or vision, and only occasionally active with exercise.  S/p right fibula fx per Dr Veverly Fells.  Has ongoing ED, asks for testosterone  Level.  Past Medical History:  Diagnosis Date  . ADD 05/03/2007  . ALLERGIC RHINITIS 11/25/2007  . ANXIETY 05/03/2007  . CHEST PAIN 07/28/2010  . Closed right ankle fracture   . Diverticulitis large intestine w/o perforation or abscess w/bleeding   . Diverticulosis   . 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 GERD 07/31/2010  . HYPERLIPIDEMIA 08/01/2007  . Hypertension   . INSOMNIA 10/17/2007  . Memory loss 11/23/2007  . PSA, INCREASED 01/06/2010  . SLEEP APNEA, OBSTRUCTIVE, SEVERE 05/03/2007   uses CPAP nightly   Past Surgical History:  Procedure Laterality Date  . NASAL SINUS SURGERY     dr Janace Hoard  . ORIF ANKLE FRACTURE Right 05/04/2020   Procedure: OPEN REDUCTION INTERNAL FIXATION (ORIF) ANKLE FRACTURE;  Surgeon: Netta Cedars, MD;  Location: [facility];  Service: Orthopedics;  Laterality: Right;  . TONSILLECTOMY      reports that he has never smoked. He has never used smokeless tobacco. He reports current alcohol use. He reports  that he does not use drugs. family history includes Cancer in his maternal grandfather; Liver disease in his sister. Allergies  Allergen Reactions  . Amoxicillin-Pot Clavulanate Nausea And Vomiting    REACTION: nausea/GI upset   Current Outpatient Medications on File Prior to Visit  Medication Sig Dispense Refill  . fluticasone (FLONASE) 50 MCG/ACT nasal spray Place into both nostrils daily.    Marland Kitchen losartan (COZAAR) 50 MG tablet Take 1 tablet (50 mg total) by mouth daily. 90 tablet 1  . ondansetron (ZOFRAN) 4 MG tablet Take 1 tablet (4 mg total) by mouth every 8 (eight) hours as needed for nausea, vomiting or refractory nausea / vomiting. 30 tablet 0  . tamsulosin (FLOMAX) 0.4 MG CAPS capsule Take by mouth.    . methocarbamol (ROBAXIN) 500 MG tablet Take 1 tablet (500 mg total) by mouth every 8 (eight) hours as needed for muscle spasms. (Patient not taking: Reported on 08/24/2020) 30 tablet 1  . oxyCODONE-acetaminophen (PERCOCET) 5-325 MG tablet Take 1 tablet by mouth every 4 (four) hours as needed for severe pain. (Patient not taking: Reported on 08/24/2020) 20 tablet 0   No current facility-administered medications on file prior to visit.   Review of Systems All otherwise neg per pt =    Objective:   Physical Exam BP 118/74 (BP Location: Left Arm, Patient Position: Sitting, Cuff Size: Large)   Pulse 96   Temp 98.3 F (36.8 C) (Oral)   Ht 5\' 8"  (1.727 m)   Wt 213 lb 12.8 oz (97 kg)  SpO2 95%   BMI 32.51 kg/m  VS noted,  Constitutional: Pt appears in NAD HENT: Head: NCAT.  Right Ear: External ear normal.  Left Ear: External ear normal.  Eyes: . Pupils are equal, round, and reactive to light. Conjunctivae and EOM are normal Nose: without d/c or deformity Neck: Neck supple. Gross normal ROM Cardiovascular: Normal rate and regular rhythm.   Pulmonary/Chest: Effort normal and breath sounds without rales or wheezing.  Abd:  Soft, NT, ND, + BS, no organomegaly Neurological: Pt is  alert. At baseline orientation, motor grossly intact Skin: Skin is warm. No rashes, other new lesions, no LE edema Psychiatric: Pt behavior is normal without agitation  All otherwise neg per pt Lab Results  Component Value Date   WBC 7.5 08/24/2020   HGB 16.5 08/24/2020   HCT 46.7 08/24/2020   PLT 253.0 08/24/2020   GLUCOSE 101 (H) 11/27/2019   CHOL 157 08/24/2020   TRIG 150.0 (H) 08/24/2020   HDL 39.50 08/24/2020   LDLDIRECT 110.0 10/05/2016   LDLCALC 87 08/24/2020   ALT 22 08/24/2020   AST 15 08/24/2020   NA 139 11/27/2019   K 4.0 11/27/2019   CL 102 11/27/2019   CREATININE 0.97 11/27/2019   BUN 11 11/27/2019   CO2 27 11/27/2019   TSH 1.91 08/24/2020   PSA 0.94 08/24/2020   HGBA1C 5.2 08/24/2020      Assessment & Plan:

## 2020-08-24 NOTE — Patient Instructions (Addendum)
You had the flu shot today  We have discussed the Cardiac CT Score test to measure the calcification level (if any) in your heart arteries.  This test has been ordered in our [facility], so please call [facility] directly, as they prefer this, at [phone] to be scheduled.  Please continue all other medications as before, and refills have been done if requested.  Please have the pharmacy call with any other refills you may need.  Please continue your efforts at being more active, low cholesterol diet, and weight control.  You are otherwise up to date with prevention measures today.  Please keep your appointments with your specialists as you may have planned  Please go to the LAB at the blood drawing area for the tests to be done  You will be contacted by phone if any changes need to be made immediately.  Otherwise, you will receive a letter about your results with an explanation, but please check with MyChart first.  Please remember to sign up for MyChart if you have not done so, as this will be important to you in the future with finding out test results, communicating by private email, and scheduling acute appointments online when needed.  Please make an Appointment to return for your 1 year visit, or sooner if needed

## 2020-08-30 ENCOUNTER — Encounter: Payer: Self-pay | Admitting: Internal Medicine

## 2020-08-30 NOTE — Assessment & Plan Note (Signed)

## 2020-08-30 NOTE — Assessment & Plan Note (Signed)
stable overall by history and exam, recent data reviewed with pt, and pt to continue medical treatment as before,  to f/u any worsening symptoms or concerns  

## 2020-08-30 NOTE — Assessment & Plan Note (Signed)
stable overall by history and exam, recent data reviewed with pt, and pt to continue medical treatment as before,  to f/u any worsening symptoms or concerns, for cardiac ct score

## 2020-08-30 NOTE — Assessment & Plan Note (Signed)
For testost lab,  to f/u any worsening symptoms or concerns

## 2020-09-07 ENCOUNTER — Encounter: Payer: Self-pay | Admitting: Internal Medicine

## 2020-12-09 ENCOUNTER — Telehealth: Payer: Self-pay | Admitting: Internal Medicine

## 2020-12-09 MED ORDER — SILDENAFIL CITRATE 100 MG PO TABS: 50.0000 mg | ORAL_TABLET | Freq: Every day | ORAL | 11 refills | Status: DC | PRN

## 2020-12-09 NOTE — Telephone Encounter (Signed)
  1.Medication Requested: sildenafil (VIAGRA) 100 MG tablet    2. Pharmacy (Name, Street, [place]): [facility] [place], [place] [address]  3. On Med List: no   4. Last Visit with PCP: 11.22.21  5. Next visit date with PCP: n/a    Agent: Please be advised that RX refills may take up to 3 business days. We ask that you follow-up with your pharmacy.

## 2020-12-24 ENCOUNTER — Encounter: Payer: Self-pay | Admitting: Internal Medicine

## 2020-12-24 ENCOUNTER — Ambulatory Visit (INDEPENDENT_AMBULATORY_CARE_PROVIDER_SITE_OTHER): Payer: 59 | Admitting: Internal Medicine

## 2020-12-24 ENCOUNTER — Other Ambulatory Visit: Payer: Self-pay

## 2020-12-24 VITALS — BP 120/76 | HR 75 | Ht 68.0 in | Wt 206.0 lb

## 2020-12-24 DIAGNOSIS — N138 Other obstructive and reflux uropathy: Secondary | ICD-10-CM

## 2020-12-24 DIAGNOSIS — N401 Enlarged prostate with lower urinary tract symptoms: Secondary | ICD-10-CM

## 2020-12-24 DIAGNOSIS — N529 Male erectile dysfunction, unspecified: Secondary | ICD-10-CM

## 2020-12-24 DIAGNOSIS — R739 Hyperglycemia, unspecified: Secondary | ICD-10-CM

## 2020-12-24 DIAGNOSIS — F411 Generalized anxiety disorder: Secondary | ICD-10-CM

## 2020-12-24 DIAGNOSIS — E559 Vitamin D deficiency, unspecified: Secondary | ICD-10-CM

## 2020-12-24 DIAGNOSIS — N400 Enlarged prostate without lower urinary tract symptoms: Secondary | ICD-10-CM | POA: Insufficient documentation

## 2020-12-24 LAB — HEMOGLOBIN A1C: Hgb A1c MFr Bld: 5.3 % (ref 4.6–6.5)

## 2020-12-24 LAB — BASIC METABOLIC PANEL
BUN: 15 mg/dL (ref 6–23)
CO2: 29 mEq/L (ref 19–32)
Calcium: 9.4 mg/dL (ref 8.4–10.5)
Chloride: 103 mEq/L (ref 96–112)
Creatinine, Ser: 1 mg/dL (ref 0.40–1.50)
GFR: 83.85 mL/min (ref >60.00)
Glucose, Bld: 100 mg/dL — ABNORMAL HIGH (ref 70–99)
Potassium: 4.3 mEq/L (ref 3.5–5.1)
Sodium: 139 mEq/L (ref 135–145)

## 2020-12-24 MED ORDER — TADALAFIL 5 MG PO TABS: 5.0000 mg | ORAL_TABLET | Freq: Every day | ORAL | 3 refills | Status: DC | PRN

## 2020-12-24 NOTE — Patient Instructions (Addendum)
Please take all new medication as prescribed - the cialis 5 mg  You can stop the flomax if we can get the cialis started  Please take  OTC Vitamin D3 at 2000 units per day, indefinitely.  Please continue all other medications as before, and refills have been done if requested.  Please have the pharmacy call with any other refills you may need.  Please continue your efforts at being more active, low cholesterol diet, and weight control.  Please keep your appointments with your specialists as you may have planned  Please go to the LAB at the blood drawing area for the tests to be done  You will be contacted by phone if any changes need to be made immediately.  Otherwise, you will receive a letter about your results with an explanation, but please check with MyChart first.  Please remember to sign up for MyChart if you have not done so, as this will be important to you in the future with finding out test results, communicating by private email, and scheduling acute appointments online when needed.

## 2020-12-24 NOTE — Progress Notes (Signed)
Patient ID: Nathaniel [name], male   DOB: [birth year], 55 y.o.   MRN: [id number]        Chief Complaint: follow up anxiety/depression, hyperglycemia, vit d deficiency, ED and BPH       HPI:  Nathaniel [name] is a 55 y.o. male here with c/o persistent mild BPH LUTS on flomax including some hesitancy and retention it seems, wondering if there is any other treatment.  Also with onset worsening ED symptoms in the past 6 mo or more, mild, intermittent, nothing else seems to make better or worse.  Otherwise, Denies urinary symptoms such as dysuria, frequency, urgency, flank pain, hematuria or n/v, fever, chills  Not taking vit d currently.   Pt denies polydipsia, polyuria, Pt denies chest pain, increased sob or doe, wheezing, orthopnea, PND, increased LE swelling, palpitations, dizziness or syncope.  Denies new worsening focal neuro s/s.   Pt denies fever, wt loss, night sweats, loss of appetite, or other constitutional symptoms  Denies worsening depressive symptoms, suicidal ideation, or panic; has ongoing anxiety, not increased recently.  Wt Readings from Last 3 Encounters:  12/24/20 206 lb (93.4 kg)  08/24/20 213 lb 12.8 oz (97 kg)  05/04/20 (!) 203 lb 14.8 oz (92.5 kg)   BP Readings from Last 3 Encounters:  12/24/20 120/76  08/24/20 118/74  05/04/20 118/83         Past Medical History:  Diagnosis Date  . ADD 05/03/2007  . ALLERGIC RHINITIS 11/25/2007  . ANXIETY 05/03/2007  . CHEST PAIN 07/28/2010  . Closed right ankle fracture   . Diverticulitis large intestine w/o perforation or abscess w/bleeding   . Diverticulosis   . 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 GERD 07/31/2010  . HYPERLIPIDEMIA 08/01/2007  . Hypertension   . INSOMNIA 10/17/2007  . Memory loss 11/23/2007  . PSA, INCREASED 01/06/2010  . SLEEP APNEA, OBSTRUCTIVE, SEVERE 05/03/2007   uses CPAP nightly   Past Surgical History:  Procedure Laterality Date  . NASAL SINUS SURGERY     dr Janace Hoard  . ORIF ANKLE FRACTURE Right  05/04/2020   Procedure: OPEN REDUCTION INTERNAL FIXATION (ORIF) ANKLE FRACTURE;  Surgeon: Netta Cedars, MD;  Location: [facility];  Service: Orthopedics;  Laterality: Right;  . TONSILLECTOMY      reports that he has never smoked. He has never used smokeless tobacco. He reports current alcohol use. He reports that he does not use drugs. family history includes Cancer in his maternal grandfather; Liver disease in his sister. Allergies  Allergen Reactions  . Amoxicillin-Pot Clavulanate Nausea And Vomiting    REACTION: nausea/GI upset   Current Outpatient Medications on File Prior to Visit  Medication Sig Dispense Refill  . doxycycline (VIBRA-TABS) 100 MG tablet Take 100 mg by mouth 2 (two) times daily.    . fluticasone (FLONASE) 50 MCG/ACT nasal spray Place into both nostrils daily.    Marland Kitchen losartan (COZAAR) 50 MG tablet Take 1 tablet (50 mg total) by mouth daily. 90 tablet 1  . methocarbamol (ROBAXIN) 500 MG tablet Take 1 tablet (500 mg total) by mouth every 8 (eight) hours as needed for muscle spasms. 30 tablet 1  . ondansetron (ZOFRAN) 4 MG tablet Take 1 tablet (4 mg total) by mouth every 8 (eight) hours as needed for nausea, vomiting or refractory nausea / vomiting. 30 tablet 0  . oxyCODONE-acetaminophen (PERCOCET) 5-325 MG tablet Take 1 tablet by mouth every 4 (four) hours as needed for severe pain. 20 tablet 0  .  POLYTRIM ophthalmic solution Instill 1 drop into left eye every two hours    . sildenafil (VIAGRA) 100 MG tablet Take 0.5-1 tablets (50-100 mg total) by mouth daily as needed for erectile dysfunction. 5 tablet 11  . tamsulosin (FLOMAX) 0.4 MG CAPS capsule Take by mouth.    . valACYclovir (VALTREX) 500 MG tablet Take 500 mg by mouth 2 (two) times daily.     No current facility-administered medications on file prior to visit.        ROS:  All others reviewed and negative.  Objective        PE:  BP 120/76   Pulse 75   Ht 5\' 8"  (1.727 m)   Wt 206 lb (93.4 kg)    SpO2 96%   BMI 31.32 kg/m                 Constitutional: Pt appears in NAD               HENT: Head: NCAT.                Right Ear: External ear normal.                 Left Ear: External ear normal.                Eyes: . Pupils are equal, round, and reactive to light. Conjunctivae and EOM are normal               Nose: without d/c or deformity               Neck: Neck supple. Gross normal ROM               Cardiovascular: Normal rate and regular rhythm.                 Pulmonary/Chest: Effort normal and breath sounds without rales or wheezing.                Abd:  Soft, NT, ND, + BS, no organomegaly               Neurological: Pt is alert. At baseline orientation, motor grossly intact               Skin: Skin is warm. No rashes, no other new lesions, LE edema - none               Psychiatric: Pt behavior is normal without agitation   Micro: none  Cardiac tracings I have personally interpreted today:  none  Pertinent Radiological findings (summarize): none   Lab Results  Component Value Date   WBC 7.5 08/24/2020   HGB 16.5 08/24/2020   HCT 46.7 08/24/2020   PLT 253.0 08/24/2020   GLUCOSE 100 (H) 12/24/2020   CHOL 157 08/24/2020   TRIG 150.0 (H) 08/24/2020   HDL 39.50 08/24/2020   LDLDIRECT 110.0 10/05/2016   LDLCALC 87 08/24/2020   ALT 22 08/24/2020   AST 15 08/24/2020   NA 139 12/24/2020   K 4.3 12/24/2020   CL 103 12/24/2020   CREATININE 1.00 12/24/2020   BUN 15 12/24/2020   CO2 29 12/24/2020   TSH 1.91 08/24/2020   PSA 0.94 08/24/2020   HGBA1C 5.3 12/24/2020   Assessment/Plan:  Nathaniel [name] is a 55 y.o. White or Caucasian [1] male with  has a past medical history of ADD (05/03/2007), ALLERGIC RHINITIS (11/25/2007), ANXIETY (05/03/2007), CHEST PAIN (07/28/2010), Closed right ankle fracture,  Diverticulitis large intestine w/o perforation or abscess w/bleeding, Diverticulosis, ERECTILE DYSFUNCTION, ORGANIC (01/06/2010), GERD (07/31/2010), HYPERLIPIDEMIA  (08/01/2007), Hypertension, INSOMNIA (10/17/2007), Memory loss (11/23/2007), PSA, INCREASED (01/06/2010), and SLEEP APNEA, OBSTRUCTIVE, SEVERE (05/03/2007).  Anxiety state Stable, to continue current med tx - reassurance, will try to hold on restarting any long term benzo as has taken before   BPH (benign prostatic hyperplasia) With incomplete improvement LUTS on the flomax - ok for cialis 5 mg qd  ERECTILE DYSFUNCTION, ORGANIC Possibly for improvement as well with the ciali 5 mg  Hyperglycemia Lab Results  Component Value Date   HGBA1C 5.3 12/24/2020   Stable, pt to continue current medical treatment  - diet, wt control   Vitamin D deficiency Last vitamin D Lab Results  Component Value Date   VD25OH 28.70 (L) 08/24/2020   Loqw, to start oral replacement   Followup: Return if symptoms worsen or fail to improve.  [name], MD 12/27/2020 9:54 AM [facility]

## 2020-12-27 ENCOUNTER — Encounter: Payer: Self-pay | Admitting: Internal Medicine

## 2020-12-27 NOTE — Assessment & Plan Note (Signed)
Possibly for improvement as well with the ciali 5 mg

## 2020-12-27 NOTE — Assessment & Plan Note (Signed)
Last vitamin D Lab Results  Component Value Date   VD25OH 28.70 (L) 08/24/2020   Loqw, to start oral replacement

## 2020-12-27 NOTE — Assessment & Plan Note (Signed)
Stable, to continue current med tx - reassurance, will try to hold on restarting any long term benzo as has taken before

## 2020-12-27 NOTE — Assessment & Plan Note (Signed)
Lab Results  Component Value Date   HGBA1C 5.3 12/24/2020   Stable, pt to continue current medical treatment  - diet, wt control

## 2020-12-27 NOTE — Assessment & Plan Note (Signed)
With incomplete improvement LUTS on the flomax - ok for cialis 5 mg qd

## 2021-01-25 ENCOUNTER — Other Ambulatory Visit: Payer: Self-pay | Admitting: Internal Medicine

## 2021-01-25 MED ORDER — LOSARTAN POTASSIUM 50 MG PO TABS: 1.0000 | ORAL_TABLET | Freq: Every day | ORAL | 3 refills | Status: DC

## 2021-01-25 NOTE — Telephone Encounter (Signed)
Please refill as per office routine med refill policy (all routine meds refilled for 3 mo or monthly per pt preference up to one year from last visit, then month to month grace period for 3 mo, then further med refills will have to be denied)  

## 2021-02-17 ENCOUNTER — Encounter: Payer: Self-pay | Admitting: Internal Medicine

## 2021-02-22 ENCOUNTER — Encounter: Payer: Self-pay | Admitting: Internal Medicine

## 2021-02-23 MED ORDER — SILDENAFIL CITRATE 100 MG PO TABS: 50.0000 mg | ORAL_TABLET | Freq: Every day | ORAL | 11 refills | Status: DC | PRN

## 2021-04-21 ENCOUNTER — Encounter: Payer: Self-pay | Admitting: Internal Medicine

## 2021-06-23 ENCOUNTER — Encounter: Payer: Self-pay | Admitting: Internal Medicine

## 2021-06-23 ENCOUNTER — Telehealth: Payer: Self-pay | Admitting: Gastroenterology

## 2021-06-23 NOTE — Telephone Encounter (Signed)
Patient called said he is having a lot of frequent BM and abdominal pain seeking advise.

## 2021-06-23 NOTE — Telephone Encounter (Signed)
Left message on machine to call back  

## 2021-06-23 NOTE — Telephone Encounter (Signed)
The pt states he has loose stools and abd pain that started a few days ago.  The last appt was with Alonza Bogus on 11/27/19.  He has a history of diverticulitis.  He does state that he has no bleeding, or fever.  He has an appt with Dr Henrene Pastor on 07/07/21.  He tells me that he has an appt with PCP tomorrow.  I advised he keep that appt and appt with Dr Henrene Pastor for 10/5.  If PCP thinks he needs to be seen sooner then that office will call to discuss.  I did offer appt with app but since he has appt with his PCP tomorrow he will keep that and see how that goes. Fernand Parkins you last saw 2/21

## 2021-06-24 ENCOUNTER — Telehealth (INDEPENDENT_AMBULATORY_CARE_PROVIDER_SITE_OTHER): Payer: 59 | Admitting: Nurse Practitioner

## 2021-06-24 ENCOUNTER — Encounter: Payer: Self-pay | Admitting: Nurse Practitioner

## 2021-06-24 ENCOUNTER — Other Ambulatory Visit: Payer: Self-pay

## 2021-06-24 ENCOUNTER — Other Ambulatory Visit (INDEPENDENT_AMBULATORY_CARE_PROVIDER_SITE_OTHER): Payer: 59

## 2021-06-24 VITALS — HR 96

## 2021-06-24 DIAGNOSIS — R1084 Generalized abdominal pain: Secondary | ICD-10-CM | POA: Diagnosis not present

## 2021-06-24 DIAGNOSIS — R109 Unspecified abdominal pain: Secondary | ICD-10-CM

## 2021-06-24 LAB — CBC WITH DIFFERENTIAL/PLATELET
Basophils Absolute: 0 10*3/uL (ref 0.0–0.1)
Basophils Relative: 0.7 % (ref 0.0–3.0)
Eosinophils Absolute: 0.1 10*3/uL (ref 0.0–0.7)
Eosinophils Relative: 1.1 % (ref 0.0–5.0)
HCT: 48.4 % (ref 39.0–52.0)
Hemoglobin: 16.5 g/dL (ref 13.0–17.0)
Lymphocytes Relative: 21 % (ref 12.0–46.0)
Lymphs Abs: 1.4 10*3/uL (ref 0.7–4.0)
MCHC: 34.1 g/dL (ref 30.0–36.0)
MCV: 90.5 fl (ref 78.0–100.0)
Monocytes Absolute: 0.8 10*3/uL (ref 0.1–1.0)
Monocytes Relative: 11.3 % (ref 3.0–12.0)
Neutro Abs: 4.4 10*3/uL (ref 1.4–7.7)
Neutrophils Relative %: 65.9 % (ref 43.0–77.0)
Platelets: 251 10*3/uL (ref 150.0–400.0)
RBC: 5.35 Mil/uL (ref 4.22–5.81)
RDW: 13.6 % (ref 11.5–15.5)
WBC: 6.7 10*3/uL (ref 4.0–10.5)

## 2021-06-24 LAB — COMPREHENSIVE METABOLIC PANEL
ALT: 28 U/L (ref 0–53)
AST: 23 U/L (ref 0–37)
Albumin: 4.7 g/dL (ref 3.5–5.2)
Alkaline Phosphatase: 80 U/L (ref 39–117)
BUN: 16 mg/dL (ref 6–23)
CO2: 26 mEq/L (ref 19–32)
Calcium: 9.5 mg/dL (ref 8.4–10.5)
Chloride: 101 mEq/L (ref 96–112)
Creatinine, Ser: 0.99 mg/dL (ref 0.40–1.50)
GFR: 84.57 mL/min (ref >60.00)
Glucose, Bld: 104 mg/dL — ABNORMAL HIGH (ref 70–99)
Potassium: 4.3 mEq/L (ref 3.5–5.1)
Sodium: 137 mEq/L (ref 135–145)
Total Bilirubin: 0.7 mg/dL (ref 0.2–1.2)
Total Protein: 7.3 g/dL (ref 6.0–8.3)

## 2021-06-24 MED ORDER — DICYCLOMINE HCL 10 MG PO CAPS: 20.0000 mg | ORAL_CAPSULE | ORAL | 1 refills | Status: DC | PRN

## 2021-06-24 MED ORDER — DIAZEPAM 5 MG PO TABS: 5.0000 mg | ORAL_TABLET | Freq: Two times a day (BID) | ORAL | 0 refills | Status: DC | PRN

## 2021-06-24 NOTE — Progress Notes (Signed)
Due to national recommendations of social distancing related to the [place] pandemic, an audio-only tele-health visit was felt to be the most appropriate encounter type for this patient today. I connected with  Nathaniel [name] on 06/24/21 utilizing audio-only technology and verified that I am speaking with the correct person using two identifiers. The patient was located at their place of employment, and I was located at the office of [facility] at [facility] during the encounter. I discussed the limitations of evaluation and management by telemedicine. The patient expressed understanding and agreed to proceed.   Subjective:  Patient ID: Nathaniel [name], male    DOB: 1966-01-18  Age: 55 y.o. MRN: [id number]  CC:  Chief Complaint  Patient presents with   Abdominal Pain      HPI  This patient arrives today for a virtual visit for the above.  Patient tells me he has been having abdominal pain in his right and left lower quadrant for the last 3 to 4 weeks.  He is also been having frequent bowel movements.  He tells me is having around 6-8 bowel movements a day sometimes they are loose sometimes they are not.  He denies any fever or seeing any obvious mucus in the stools.  He also denies seeing any blood in the stool.  He reached out to his gastroenterologist Dr. Henrene Pastor who has initiated diagnostic testing including ordering CBC, CMP, CRP, and a stool pathogen panel.  He also prescribe Bentyl today for the patient to take as needed.  He is scheduled to see Dr. Henrene Pastor in approximately 2 weeks.  The patient tells me today his symptoms are better but he still wanted to keep his appointment and discuss his concerns.  He tells me he is only had 1 bowel movement today.  He tells me he has been under quite a bit of emotional stress recently and thinks this may be contributing but want to ensure that it was nothing more concerning.  Last colonoscopy was about 7 years ago and he tells me he is due  in 2025.  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   GERD 07/31/2010   HYPERLIPIDEMIA 08/01/2007   Hypertension    INSOMNIA 10/17/2007   Memory loss 11/23/2007   PSA, INCREASED 01/06/2010   SLEEP APNEA, OBSTRUCTIVE, SEVERE 05/03/2007   uses CPAP nightly      Family History  Problem Relation Age of Onset   Cancer Maternal Grandfather        prostate cancer   Liver disease Sister    Colon cancer Neg Hx    Rectal cancer Neg Hx    Stomach cancer Neg Hx     Social History   Social History Narrative   Not on file   Social History   Tobacco Use   Smoking status: Never   Smokeless tobacco: Never  Substance Use Topics   Alcohol use: Yes    Comment: socially on weekends     Current Meds  Medication Sig   dicyclomine (BENTYL) 10 MG capsule Take 2 capsules (20 mg total) by mouth every 4 (four) hours as needed for spasms (Take every 4-6 hours as needed for abdominal pain).   fluticasone (FLONASE)  50 MCG/ACT nasal spray Place into both nostrils daily.   losartan (COZAAR) 50 MG tablet Take 1 tablet (50 mg total) by mouth daily.   sildenafil (VIAGRA) 100 MG tablet Take 0.5-1 tablets (50-100 mg total) by mouth daily as needed for erectile dysfunction.   [DISCONTINUED] tadalafil (CIALIS) 5 MG tablet Take 1 tablet (5 mg total) by mouth daily as needed for erectile dysfunction.   [DISCONTINUED] tamsulosin (FLOMAX) 0.4 MG CAPS capsule Take by mouth.    ROS:  Review of Systems  Constitutional:  Negative for fever.  Gastrointestinal:  Positive for abdominal pain, diarrhea and nausea. Negative for blood in stool, melena and vomiting.    Objective:   Today's Vitals: Pulse 96  Vitals with BMI 06/24/2021 12/24/2020 08/24/2020  Height -  5\' 8"  5\' 8"   Weight - 206 lbs 213 lbs 13 oz  BMI - 09.29 57.47  Systolic - 340 370  Diastolic - 76 74  Pulse 96 75 96     Physical Exam Comprehensive physical exam not completed today as office visit was conducted remotely.  Most vital signs not available at time of visit, he was able to tell me his pulse based on his apple watch reading.  Patient sounded well over the phone, he did not appear to be in any acute distress.  Patient was alert and oriented, and appeared to have appropriate judgment.'      Assessment and Plan   1. Abdominal pain, unspecified abdominal location      Plan: 1.  I recommend he wait for results from Dr. Blanch Media orders before proceeding with additional diagnostic evaluation.  If all blood work and stool results come back negative I encouraged him to call our office if he is still waiting to see Dr. Henrene Pastor in person we may consider ordering CT scan of his abdomen if the symptoms persist.  We also discussed red flag symptoms for which she should proceed to the emergency department including spiking a fever, worsening severity of pain, inability to pass gas, or nausea and vomiting.  He tells me he understands.   Tests ordered No orders of the defined types were placed in this encounter.     No orders of the defined types were placed in this encounter.   Patient to follow-up as needed and or indicated.  Total time spent on telephone today was 23 minutes and 10 seconds.  Ailene Ards, NP

## 2021-06-25 LAB — C-REACTIVE PROTEIN: CRP: <1 mg/dL (ref 0.5–20.0)

## 2021-06-28 LAB — GI PROFILE, STOOL, PCR

## 2021-07-04 ENCOUNTER — Encounter: Payer: Self-pay | Admitting: Internal Medicine

## 2021-07-05 ENCOUNTER — Telehealth: Payer: Self-pay | Admitting: Internal Medicine

## 2021-07-05 NOTE — Telephone Encounter (Signed)
Patient calling in with  respiratory symptoms: Shortness of breath, chest pain, palpitations or other red words send to Triage  Does the patient have a fever over 100 or positive COVID  test within the last 5 days?   Yes, please schedule virtual visit   No, proceed with following questions  Does the patient have 2 or more of the following symptoms?  Cough, sore throat, runny nose, chills, loss of taste or smell   Yes, please schedule virtual visit  If no availability for virtual visit in office,  please schedule another [place] office  If no availability at another [place] office, please instruct patient that they can schedule an evisit or virtual visit through mychart.    No, please schedule patient for in office visit (patients can be seen in office 5 days after positive COVID test)  If no availability in office, please schedule another [place] office  If no availability at another [place] office, please instruct patient that they can schedule an evisit or virtual visit through mychart.

## 2021-07-06 ENCOUNTER — Other Ambulatory Visit: Payer: Self-pay

## 2021-07-06 ENCOUNTER — Telehealth (INDEPENDENT_AMBULATORY_CARE_PROVIDER_SITE_OTHER): Payer: 59 | Admitting: Internal Medicine

## 2021-07-06 DIAGNOSIS — F411 Generalized anxiety disorder: Secondary | ICD-10-CM

## 2021-07-06 DIAGNOSIS — J069 Acute upper respiratory infection, unspecified: Secondary | ICD-10-CM | POA: Diagnosis not present

## 2021-07-06 DIAGNOSIS — H109 Unspecified conjunctivitis: Secondary | ICD-10-CM

## 2021-07-06 MED ORDER — HYDROCODONE BIT-HOMATROP MBR 5-1.5 MG/5ML PO SOLN: 5.0000 mL | Freq: Four times a day (QID) | ORAL | 0 refills | Status: AC | PRN

## 2021-07-06 MED ORDER — ERYTHROMYCIN 5 MG/GM OP OINT: 1.0000 "application " | TOPICAL_OINTMENT | Freq: Four times a day (QID) | OPHTHALMIC | 0 refills | Status: AC

## 2021-07-06 MED ORDER — PREDNISONE 10 MG PO TABS: ORAL_TABLET | ORAL | 0 refills | Status: DC

## 2021-07-06 MED ORDER — DOXYCYCLINE HYCLATE 100 MG PO TABS: 100.0000 mg | ORAL_TABLET | Freq: Two times a day (BID) | ORAL | 0 refills | Status: DC

## 2021-07-06 NOTE — Patient Instructions (Signed)
Please take all new medication as prescribed 

## 2021-07-06 NOTE — Progress Notes (Signed)
Patient ID: Nathaniel [name], male   DOB: 11/02/1965, 55 y.o.   MRN: [id number]  Virtual Visit via Video Note  I connected with [name] on 07/10/21 at 11:00 AM EDT by a video enabled telemedicine application and verified that I am speaking with the correct person using two identifiers.  Location of all participants today Patient: at home Provider: at office   I discussed the limitations of evaluation and management by telemedicine and the availability of in person appointments. The patient expressed understanding and agreed to proceed.  History of Present Illness:  Here with 2-3 days acute onset fever, facial pain, pressure, headache, general weakness and malaise, and greenish d/c, with mild ST and cough, but pt denies chest pain, wheezing, increased sob or doe, orthopnea, PND, increased LE swelling, palpitations, dizziness or syncope, also with bilateral eye crusting and discomfort and some weepiness as well.   Pt denies polydipsia, polyuria, or new focal neuro s/s.  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 He has never used smokeless tobacco. He reports current alcohol use. He reports that he does not use drugs. family history includes Cancer in his maternal grandfather; Liver disease in his sister. Allergies  Allergen Reactions   Amoxicillin-Pot Clavulanate Nausea And Vomiting    REACTION: nausea/GI upset   Current Outpatient Medications on File Prior to Visit  Medication Sig Dispense Refill   dicyclomine (BENTYL) 10 MG capsule Take 2 capsules (20 mg total) by mouth every 4 (four) hours as needed for spasms (Take every 4-6 hours as needed for abdominal pain). 30 capsule 1   fluticasone (FLONASE) 50 MCG/ACT nasal spray Place into both nostrils daily.     losartan (COZAAR) 50 MG tablet Take 1 tablet (50 mg total) by mouth daily. 90 tablet 3   sildenafil (VIAGRA) 100 MG tablet Take 0.5-1 tablets (50-100 mg total) by mouth daily as needed for erectile dysfunction. 5 tablet 11   No current facility-administered medications on file prior to visit.    Observations/Objective: Alert, NAD, appropriate mood and affect, resps normal, cn 2-12 intact, moves all 4s, no visible rash or swelling Lab Results  Component Value Date   WBC 6.7 06/24/2021   HGB 16.5 06/24/2021   HCT 48.4 06/24/2021   PLT 251.0 06/24/2021   GLUCOSE 104 (H) 06/24/2021   CHOL 157 08/24/2020   TRIG 150.0 (H) 08/24/2020   HDL 39.50 08/24/2020   LDLDIRECT 110.0 10/05/2016   LDLCALC 87 08/24/2020   ALT 28 06/24/2021  AST 23 06/24/2021   NA 137 06/24/2021   K 4.3 06/24/2021   CL 101 06/24/2021   CREATININE 0.99 06/24/2021   BUN 16 06/24/2021   CO2 26 06/24/2021   TSH 1.91 08/24/2020   PSA 0.94 08/24/2020   HGBA1C 5.3 12/24/2020   Assessment and Plan: See notes  Follow Up Instructions: See notes   I discussed the assessment and treatment plan with the patient. The patient was provided an opportunity to ask questions and all were answered. The patient agreed with the plan and  demonstrated an understanding of the instructions.   The patient was advised to call back or seek an in-person evaluation if the symptoms worsen or if the condition fails to improve as anticipated.  [name], MD

## 2021-07-07 ENCOUNTER — Ambulatory Visit (INDEPENDENT_AMBULATORY_CARE_PROVIDER_SITE_OTHER): Payer: 59 | Admitting: Internal Medicine

## 2021-07-07 ENCOUNTER — Encounter: Payer: Self-pay | Admitting: Internal Medicine

## 2021-07-07 VITALS — BP 100/64 | HR 76 | Ht 68.0 in | Wt 205.0 lb

## 2021-07-07 DIAGNOSIS — R1084 Generalized abdominal pain: Secondary | ICD-10-CM | POA: Diagnosis not present

## 2021-07-07 DIAGNOSIS — R195 Other fecal abnormalities: Secondary | ICD-10-CM | POA: Diagnosis not present

## 2021-07-07 DIAGNOSIS — R140 Abdominal distension (gaseous): Secondary | ICD-10-CM | POA: Diagnosis not present

## 2021-07-07 DIAGNOSIS — R194 Change in bowel habit: Secondary | ICD-10-CM

## 2021-07-07 MED ORDER — SUTAB 1479-225-188 MG PO TABS: 1.0000 | ORAL_TABLET | Freq: Once | ORAL | 0 refills | Status: AC

## 2021-07-07 NOTE — Patient Instructions (Signed)
If you are age 57 or older, your body mass index should be between 23-30. Your Body mass index is 31.17 kg/m. If this is out of the aforementioned range listed, please consider follow up with your Primary Care Provider.  If you are age 43 or younger, your body mass index should be between 19-25. Your Body mass index is 31.17 kg/m. If this is out of the aformentioned range listed, please consider follow up with your Primary Care Provider.   __________________________________________________________  The [place] [facility] providers would like to encourage you to use [facility] to communicate with providers for non-urgent requests or questions.  Due to long hold times on the telephone, sending your provider a message by [facility] may be a faster and more efficient way to get a response.  Please allow 48 business hours for a response.  Please remember that this is for non-urgent requests.   You have been scheduled for a colonoscopy. Please follow written instructions given to you at your visit today.  Please pick up your prep supplies at the pharmacy within the next 1-3 days. If you use inhalers (even only as needed), please bring them with you on the day of your procedure.

## 2021-07-07 NOTE — Progress Notes (Signed)
HISTORY OF PRESENT ILLNESS:  Nathaniel [name] is a 55 y.o. male with past medical history as listed below who presents today for evaluation of recent problems with diarrhea, change in bowel habits, and abdominal discomfort.  The patient was last seen in this office by the GI physician assistant November 27, 2019 with complaints of generalized abdominal pain and bloating.  See that dictation for details.  He subsequently underwent a contrast-enhanced CT scan of the abdomen and pelvis.  There were a number of incidental findings, but no acute findings.  Incidental diverticulosis without diverticulitis and a small umbilical hernia noted.  He tells me that his discomfort seemed to improve thereafter.  His current history dates back about 4 or 5 weeks when he developed lower abdominal discomfort.  Subsequent problems with diarrhea and increased frequency of bowel movements.  He contacted this office with these concerns on June 23, 2021.  We ordered blood work including CBC, comprehensive metabolic panel, and C-reactive protein.     These were performed the following day and returned normal.  He also submitted stools for GI enteric pathogen panel including C. difficile.  This was negative.  He was prescribed Bentyl as needed for abdominal discomfort and this appointment arranged.  He did see his PCP June 24, 2021.  Reviewed.  Patient tells me that his diarrhea is less problematic.  He now describes 2 or 3 soft formed stools daily.  Previously 1 bowel movement daily.  No bleeding.  No weight loss.  No nocturnal symptoms.  He also describes generalized lower abdominal discomfort and bloating.  In retrospect he thinks this is similar to his prior evaluation with subsequent CT.  He also describes increased gas and flatus.  He does notice some postprandial urgency with greasy meals.  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  He did undergo complete colonoscopy for routine screening June 24, 2014.  He was found to have mild left-sided diverticulosis.  The colon and terminal ileum were otherwise normal.  Routine follow-up in 10 years recommended.  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 He has never used smokeless tobacco. He reports current alcohol use. He reports that he does not use drugs.  family history includes Cancer in his maternal grandfather; Liver disease in his sister.  Allergies  Allergen  Reactions   Amoxicillin-Pot Clavulanate Nausea And Vomiting    REACTION: nausea/GI upset       PHYSICAL EXAMINATION: Vital signs: BP 100/64   Pulse 76   Ht 5\' 8"  (1.727 m)   Wt 205 lb (93 kg)   BMI 31.17 kg/m   Constitutional: generally well-appearing, no acute distress Psychiatric: alert and oriented x3, cooperative Eyes: extraocular movements intact, anicteric, conjunctiva pink Mouth:  oral pharynx moist, no lesions Neck: supple no lymphadenopathy Cardiovascular: heart regular rate and rhythm, no murmur Lungs: clear to auscultation bilaterally Abdomen: soft, nontender, nondistended, no obvious ascites, no peritoneal signs, normal bowel sounds, no organomegaly Rectal: Deferred until colonoscopy Extremities: no clubbing, cyanosis, or lower extremity edema bilaterally Skin: no lesions on visible extremities Neuro: No focal deficits.  Cranial nerves intact  ASSESSMENT:  1.  Recent problems with transient diarrhea, change in bowel habits (which persist), and abdominal discomfort.  I suspect that he had an acute viral illness.  He is concerned about more significant abnormalities, particularly given his abdominal discomfort. 2.  Situational anxiety 3.  Colonoscopy 2015 with diverticulosis   PLAN:  1.  Schedule colonoscopy to evaluate change in bowel habits and abdominal pain.The nature of the procedure, as well as the risks, benefits, and alternatives were carefully and thoroughly reviewed with the patient. Ample time for discussion and questions allowed. The patient understood, was satisfied, and agreed to proceed.  2.  Encouraged to use Bentyl as needed for abdominal discomfort A total time of 40 minutes was spent preparing to see the patient, reviewing multiple tests and x-rays, obtaining comprehensive history, performing medically appropriate physical examination, counseling the patient regarding his above listed issues, ordering medications and endoscopic procedure.  Finally, documenting clinical information in the health record

## 2021-07-10 ENCOUNTER — Encounter: Payer: Self-pay | Admitting: Internal Medicine

## 2021-07-10 DIAGNOSIS — J069 Acute upper respiratory infection, unspecified: Secondary | ICD-10-CM | POA: Insufficient documentation

## 2021-07-10 DIAGNOSIS — H109 Unspecified conjunctivitis: Secondary | ICD-10-CM | POA: Insufficient documentation

## 2021-07-10 NOTE — Assessment & Plan Note (Signed)
Mild to mod, for antibx course,  to f/u any worsening symptoms or concerns 

## 2021-07-10 NOTE — Assessment & Plan Note (Signed)
Stable overall,  to f/u any worsening symptoms or concerns 

## 2021-07-10 NOTE — Assessment & Plan Note (Signed)
Mild to mod, for antibx course cough med prn, predpac asd,,  to f/u any worsening symptoms or concerns

## 2021-07-19 ENCOUNTER — Other Ambulatory Visit: Payer: Self-pay

## 2021-07-19 ENCOUNTER — Encounter: Payer: Self-pay | Admitting: Internal Medicine

## 2021-07-19 ENCOUNTER — Ambulatory Visit (AMBULATORY_SURGERY_CENTER): Payer: 59 | Admitting: Internal Medicine

## 2021-07-19 VITALS — BP 113/79 | HR 71 | Temp 98.3°F | Resp 17 | Ht 68.0 in | Wt 205.0 lb

## 2021-07-19 DIAGNOSIS — K621 Rectal polyp: Secondary | ICD-10-CM | POA: Diagnosis not present

## 2021-07-19 DIAGNOSIS — R197 Diarrhea, unspecified: Secondary | ICD-10-CM | POA: Diagnosis not present

## 2021-07-19 DIAGNOSIS — R194 Change in bowel habit: Secondary | ICD-10-CM

## 2021-07-19 DIAGNOSIS — K648 Other hemorrhoids: Secondary | ICD-10-CM

## 2021-07-19 DIAGNOSIS — R140 Abdominal distension (gaseous): Secondary | ICD-10-CM

## 2021-07-19 DIAGNOSIS — D128 Benign neoplasm of rectum: Secondary | ICD-10-CM

## 2021-07-19 DIAGNOSIS — K5730 Diverticulosis of large intestine without perforation or abscess without bleeding: Secondary | ICD-10-CM

## 2021-07-19 DIAGNOSIS — D122 Benign neoplasm of ascending colon: Secondary | ICD-10-CM

## 2021-07-19 DIAGNOSIS — R1084 Generalized abdominal pain: Secondary | ICD-10-CM

## 2021-07-19 DIAGNOSIS — R195 Other fecal abnormalities: Secondary | ICD-10-CM

## 2021-07-19 MED ORDER — SODIUM CHLORIDE 0.9 % IV SOLN: 500.0000 mL | Freq: Once | INTRAVENOUS | Status: DC

## 2021-07-19 NOTE — Progress Notes (Signed)
Pt's states no medical or surgical changes since previsit or office visit. 

## 2021-07-19 NOTE — Progress Notes (Signed)
GI PREPROCEDURE H&P  Patient was fully evaluated in the office by myself July 07, 2021 regarding change in bowel habits and abdominal discomfort.  He is now for colonoscopy.  See that complete H&P.  No interval changes

## 2021-07-19 NOTE — Progress Notes (Signed)
Sedate, gd SR, tolerated procedure well, VSS, report to RN 

## 2021-07-19 NOTE — Progress Notes (Signed)
Called to room to assist during endoscopic procedure.  Patient ID and intended procedure confirmed with present staff. Received instructions for my participation in the procedure from the performing physician.  

## 2021-07-19 NOTE — Op Note (Signed)
[place] Patient Name: Derrek Puff Procedure Date: 07/19/2021 7:24 AM MRN: [id number] Endoscopist: Docia Chuck. Henrene Pastor , MD Age: 55 Referring MD:  Date of Birth: [birth year] Gender: Male Account #: [id number] Procedure:                Colonoscopy with cold snare polypectomy x 3; with                            biopsies Indications:              Abdominal distress, Change in bowel habits, loose                            stools. Previous examination 2015 was negative for                            neoplasia Medicines:                Monitored Anesthesia Care Procedure:                Pre-Anesthesia Assessment:                           - Prior to the procedure, a History and Physical                            was performed, and patient medications and                            allergies were reviewed. The patient's tolerance of                            previous anesthesia was also reviewed. The risks                            and benefits of the procedure and the sedation                            options and risks were discussed with the patient.                            All questions were answered, and informed consent                            was obtained. Prior Anticoagulants: The patient has                            taken no previous anticoagulant or antiplatelet                            agents. ASA Grade Assessment: I - A normal, healthy                            patient. After reviewing the risks and benefits,  the patient was deemed in satisfactory condition to                            undergo the procedure.                           After obtaining informed consent, the colonoscope                            was passed under direct vision. Throughout the                            procedure, the patient's blood pressure, pulse, and                            oxygen saturations were monitored continuously. The                             Olympus CF-HQ190L ([id number]) Colonoscope was                            introduced through the anus and advanced to the the                            cecum, identified by appendiceal orifice and                            ileocecal valve. The ileocecal valve, appendiceal                            orifice, and rectum were photographed. The quality                            of the bowel preparation was excellent. The                            colonoscopy was performed without difficulty. The                            patient tolerated the procedure well. The bowel                            preparation used was SUPREP via split dose                            instruction. Scope In: 8:19:29 AM Scope Out: 8:37:01 AM Scope Withdrawal Time: 0 hours 14 minutes 48 seconds  Total Procedure Duration: 0 hours 17 minutes 32 seconds  Findings:                 Three sessile polyps were found in the rectum and                            ascending colon. The polyps were 3 to 5 mm in size.  These polyps were removed with a cold snare.                            Resection and retrieval were complete.                           Multiple diverticula were found in the sigmoid                            colon.                           Internal hemorrhoids were found during retroflexion.                           The exam was otherwise without abnormality on                            direct and retroflexion views. Complications:            No immediate complications. Estimated blood loss:                            None. Estimated Blood Loss:     Estimated blood loss: none. Impression:               - Three 3 to 5 mm polyps in the rectum and in the                            ascending colon, removed with a cold snare.                            Resected and retrieved.                           - Diverticulosis in the sigmoid colon.                           -  Internal hemorrhoids.                           - The examination was otherwise normal on direct                            and retroflexion views. Recommendation:           - Repeat colonoscopy in 5 years for surveillance.                           - Patient has a contact number available for                            emergencies. The signs and symptoms of potential                            delayed complications were discussed with the  patient. Return to normal activities tomorrow.                            Written discharge instructions were provided to the                            patient.                           - Resume previous diet.                           - Continue present medications.                           - Await pathology results. Docia Chuck. Henrene Pastor, MD 07/19/2021 8:58:14 AM This report has been signed electronically.

## 2021-07-19 NOTE — Patient Instructions (Signed)
Resume previous diet and continue present medications. Awaiting pathology results. Repeat Colonoscopy in 5 years for surveillance. YOU HAD AN ENDOSCOPIC PROCEDURE TODAY AT [facility]:   Refer to the procedure report that was given to you for any specific questions about what was found during the examination.  If the procedure report does not answer your questions, please call your gastroenterologist to clarify.  If you requested that your care partner not be given the details of your procedure findings, then the procedure report has been included in a sealed envelope for you to review at your convenience later.  YOU SHOULD EXPECT: Some feelings of bloating in the abdomen. Passage of more gas than usual.  Walking can help get rid of the air that was put into your GI tract during the procedure and reduce the bloating. If you had a lower endoscopy (such as a colonoscopy or flexible sigmoidoscopy) you may notice spotting of blood in your stool or on the toilet paper. If you underwent a bowel prep for your procedure, you may not have a normal bowel movement for a few days.  Please Note:  You might notice some irritation and congestion in your nose or some drainage.  This is from the oxygen used during your procedure.  There is no need for concern and it should clear up in a day or so.  SYMPTOMS TO REPORT IMMEDIATELY:  Following lower endoscopy (colonoscopy or flexible sigmoidoscopy):  Excessive amounts of blood in the stool  Significant tenderness or worsening of abdominal pains  Swelling of the abdomen that is new, acute  Fever of 100F or higher  For urgent or emergent issues, a gastroenterologist can be reached at any hour by calling [phone]. Do not use MyChart messaging for urgent concerns.    DIET:  We do recommend a small meal at first, but then you may proceed to your regular diet.  Drink plenty of fluids but you should avoid alcoholic beverages for 24  hours.  ACTIVITY:  You should plan to take it easy for the rest of today and you should NOT DRIVE or use heavy machinery until tomorrow (because of the sedation medicines used during the test).    FOLLOW UP: Our staff will call the number listed on your records 48-72 hours following your procedure to check on you and address any questions or concerns that you may have regarding the information given to you following your procedure. If we do not reach you, we will leave a message.  We will attempt to reach you two times.  During this call, we will ask if you have developed any symptoms of COVID 19. If you develop any symptoms (ie: fever, flu-like symptoms, shortness of breath, cough etc.) before then, please call [phone].  If you test positive for Covid 19 in the 2 weeks post procedure, please call and report this information to [place].    If any biopsies were taken you will be contacted by phone or by letter within the next 1-3 weeks.  Please call us at [phone] if you have not heard about the biopsies in 3 weeks.    SIGNATURES/CONFIDENTIALITY: You and/or your care partner have signed paperwork which will be entered into your electronic medical record.  These signatures attest to the fact that that the information above on your After Visit Summary has been reviewed and is understood.  Full responsibility of the confidentiality of this discharge information lies with you and/or your care-partner.

## 2021-07-19 NOTE — Progress Notes (Signed)
C.W. vital signs. 

## 2021-07-21 ENCOUNTER — Telehealth: Payer: Self-pay

## 2021-07-21 NOTE — Telephone Encounter (Signed)
First post procedure follow up call, no answer 

## 2021-07-21 NOTE — Telephone Encounter (Signed)
  Follow up Call-  Call back number 07/19/2021  Post procedure Call Back phone  # [phone]  Permission to leave phone message Yes  Some recent data might be hidden     Patient questions:  Do you have a fever, pain , or abdominal swelling? No. Pain Score  0 *  Have you tolerated food without any problems? Yes.    Have you been able to return to your normal activities? Yes.    Do you have any questions about your discharge instructions: Diet   No. Medications  No. Follow up visit  No.  Do you have questions or concerns about your Care? No.  Actions: * If pain score is 4 or above: No action needed, pain <4.  Have you developed a fever since your procedure? no  2.   Have you had an respiratory symptoms (SOB or cough) since your procedure? no  3.   Have you tested positive for COVID 19 since your procedure no  4.   Have you had any family members/close contacts diagnosed with the COVID 19 since your procedure?  no   If yes to any of these questions please route to Joylene Timmey, RN and Joella Prince, RN

## 2021-07-22 ENCOUNTER — Encounter: Payer: Self-pay | Admitting: Internal Medicine

## 2021-09-25 ENCOUNTER — Other Ambulatory Visit: Payer: Self-pay | Admitting: Internal Medicine

## 2021-10-25 DIAGNOSIS — G4733 Obstructive sleep apnea (adult) (pediatric): Secondary | ICD-10-CM | POA: Diagnosis not present

## 2021-11-10 ENCOUNTER — Encounter: Payer: Self-pay | Admitting: Internal Medicine

## 2021-11-17 ENCOUNTER — Telehealth: Payer: Self-pay | Admitting: Adult Health

## 2021-11-17 ENCOUNTER — Encounter: Payer: Self-pay | Admitting: Neurology

## 2021-11-17 NOTE — Telephone Encounter (Signed)
Pt was seen 12-2019.  Has appt 02-08-2022 with Dr. Brett Fairy.   He is asking for a travel cpap machine.  Ok to do?  Is on waitlist for earlier appt with Dr. Brett Fairy. Please advise.

## 2021-11-17 NOTE — Telephone Encounter (Signed)
Pt LVM: Have place an order with online company to get a travel CPAP machine. They will faxing a request to Dr. Brett Fairy to approve. Just a travel machine, already got one in house. Would like a call back  Pt scheduled an appt on 02/08/22 at 2:30pm and ask to be put on the waitlist.

## 2021-11-19 NOTE — Progress Notes (Signed)
11/22/2021 Nathaniel [name] [id number] Jan 03, 1966   ASSESSMENT AND PLAN:   Generalized abdominal pain -     DG Abd 1 View; Future - continue IBGard, will check for stool burden if negative can consider IBS-D treatment with xifaxin - recent normal colonoscopy 07/2021 with Dr. Henrene Pastor  Loose stools Will check for celiac.  Will evaluate for constipation/IBS.  Given FODMAP information and discussed diet.  Will potentially start on xifaxin for possible hydrogen breath test if no stool burden  Anal leakage Check Xray  Seems to be better on fiber continue this No back pain, leg issues, if any worsening issues, consider Xray back Declined rectal exam today  Future Appointments  Date Time Provider [place]  11/23/2021  8:00 AM Ward Givens, NP [facility] None  02/08/2022  2:30 PM Dohmeier, Asencion Partridge, MD [facility] None    History of Present Illness:  56 y.o. male  with a past medical history of OSA on CPAP, HTN, diverticulosis with history of perforation, BPH, depression,  and others listed below, known to Dr. Henrene Pastor returns to clinic today for evaluation of change in bowel habits.  Patient last seen 07/07/2021 by Dr. Henrene Pastor for change in bowel habits with diarrhea and abdominal discomfort.  Negative labs below.  Prescribed Bentyl which helps some.   Sent a message 11/10/2021 with continuing AB pain, frequent BMs and anal leakage- reassured for normal colon 07/2021, told to start citrucel and IBgard.  This is helping with the AB pain, not happening as frequently.  AB pain was worse with lying down, not with sitting down.   Patient started to have anal leakage, states this has improved some, doing citracel once a day.  Started on gas X.  BM's were after eating and going 4-5 x a day, now doing 2-3 BM's.  Had loose, mushy stools, now more formed.  Yesterday was better than day before for leakage, slowly improving.  Clear leakage, feels wetness but nothing there. No fecal leakage.   Just on buttocks, no rectal pain.   Started to have urinary frequency, started on flomax but stopped.   No lower back or perineal pain. No history of diabetes. No protrusion of tissue from the anal canal.  Previous GI history: 07/19/2021 colonoscopy for change in bowel habits, loose stools showed 3 polyps ranging from 3 to 5 mm, 2 polyps were tubular adenoma without high-grade dysplasia, sessile serrated polyp, hyperplastic polyp, diverticulosis, internal hemorrhoids.  Recall 5 years Normal CRP 06/2021 negative GI pathogen, C. Difficile June 24, 2014 colonoscopy  mild left-sided diverticulosis.  The colon and terminal ileum were otherwise normal.   Routine follow-up in 10 years recommended.  12/05/2019 CT abdomen and pelvis for abdominal pain 1. Persistent right mid and distal hydroureter extending to the level of the UVJ, without an obvious mass or stone identified. This could be due to chronic mild stricturing at the right UVJ or a mild congenital megaureter. 2. 1 mm left kidney lower pole nonobstructive renal calculus. 3. Considerable central narrowing of the thecal sac of the L4-5 level due to degenerative facet arthropathy and 4 mm degenerative anterolisthesis. 4. Several small sub-centimeter hepatic hypodense lesions are unchanged from 04/26/2017 and most likely benign. 5. Mild prostatomegaly. 6. Small umbilical hernia contains adipose tissue. 7. Sigmoid diverticula noted, without active diverticulitis.  No results found.  Current Medications:    Current Outpatient Medications (Cardiovascular):    losartan (COZAAR) 50 MG tablet, Take 1 tablet (50 mg total) by mouth daily.  tadalafil (CIALIS) 5 MG tablet, Take 5 mg by mouth daily as needed for erectile dysfunction.  Current Outpatient Medications (Respiratory):    fluticasone (FLONASE) 50 MCG/ACT nasal spray, Place into both nostrils daily.     Surgical History:  He  has a past surgical history that includes  Tonsillectomy; Nasal sinus surgery; and ORIF ankle fracture (Right, 05/04/2020). Family History:  His family history includes Cancer in his maternal grandfather; Liver disease in his sister. Social History:   reports that he has never smoked. He has never used smokeless tobacco. He reports current alcohol use. He reports that he does not use drugs.  Current Medications, Allergies, Past Medical History, Past Surgical History, Family History and Social History were reviewed in [employer] record.  Physical Exam: BP 118/70 (BP Location: Left Arm, Patient Position: Sitting, Cuff Size: Normal)    Pulse 80    Ht 5\' 8"  (1.727 m) Comment: height measured without shoes   Wt 210 lb (95.3 kg)    BMI 31.93 kg/m  General:   Pleasant, well developed male in no acute distress Eyes: sclerae anicteric,conjunctive pink  Heart:  regular rate and rhythm Pulm: Clear anteriorly; no wheezing Abdomen:  Soft, Obese AB, skin exam normal, Normal bowel sounds. mild tenderness in the lower abdomen. Without guarding and Without rebound, without hepatomegaly. Rectal exam: declines Extremities:  With edema. Peripheral pulses intact.  Neurologic:  Alert and  oriented x4;  grossly normal neurologically. Skin:   Dry and intact without significant lesions or rashes. Psychiatric: Demonstrates good judgement and reason without abnormal affect or behaviors.  Vladimir Crofts, PA-C 11/22/21

## 2021-11-22 ENCOUNTER — Ambulatory Visit: Payer: 59 | Admitting: Physician Assistant

## 2021-11-22 ENCOUNTER — Other Ambulatory Visit: Payer: Self-pay

## 2021-11-22 ENCOUNTER — Ambulatory Visit (INDEPENDENT_AMBULATORY_CARE_PROVIDER_SITE_OTHER)
Admission: RE | Admit: 2021-11-22 | Discharge: 2021-11-22 | Disposition: A | Discharge status: Home / Self Care | Payer: 59 | Source: Ambulatory Visit | Attending: Physician Assistant | Admitting: Physician Assistant

## 2021-11-22 ENCOUNTER — Other Ambulatory Visit: Payer: 59

## 2021-11-22 ENCOUNTER — Encounter: Payer: Self-pay | Admitting: Physician Assistant

## 2021-11-22 VITALS — BP 118/70 | HR 80 | Ht 68.0 in | Wt 210.0 lb

## 2021-11-22 DIAGNOSIS — R159 Full incontinence of feces: Secondary | ICD-10-CM

## 2021-11-22 DIAGNOSIS — R195 Other fecal abnormalities: Secondary | ICD-10-CM

## 2021-11-22 DIAGNOSIS — R1084 Generalized abdominal pain: Secondary | ICD-10-CM | POA: Diagnosis not present

## 2021-11-22 NOTE — Patient Instructions (Addendum)
Will get Xray Continue fiber supplement once daily, can increase to two a day If large stool burden, continue fiber If no stool burden on xray can do trial of Xifaxin for SIBO or small intestinal bacterial overgrowth.  If any back pain, leg weakness, leg numbness notify PCP.   Diverticulosis Diverticulosis is a condition that develops when small pouches (diverticula) form in the wall of the large intestine (colon). The colon is where water is absorbed and stool (feces) is formed. The pouches form when the inside layer of the colon pushes through weak spots in the outer layers of the colon. You may have a few pouches or many of them. The pouches usually do not cause problems unless they become inflamed or infected. When this happens, the condition is called diverticulitis- this is left lower quadrant pain, diarrhea, fever, chills, nausea or vomiting.  If this occurs please call the office or go to the hospital. Sometimes these patches without inflammation can also have painless bleeding associated with them, if this happens please call the office or go to the hospital. Preventing constipation and increasing fiber can help reduce diverticula and prevent complications. Even if you feel you have a high-fiber diet, suggest getting on Benefiber 2-3 times daily. What increases the risk? The following factors may make you more likely to develop this condition: Being older than age 55. Your risk for this condition increases with age. Diverticulosis is rare among people younger than age 104. By age 33, many people have it. Eating a low-fiber diet. Having frequent constipation. Being overweight. Not getting enough exercise. Smoking. Taking over-the-counter pain medicines, like aspirin and ibuprofen.- can increase a flare.  Having a family history of diverticulosis. How is this diagnosed? Because diverticulosis usually has no symptoms, it is most often diagnosed during an exam for other colon problems.  The condition may be diagnosed by: Using a flexible scope to examine the colon (colonoscopy). Taking an X-ray of the colon after dye has been put into the colon (barium enema). Having a CT scan. How is this treated? You may not need treatment for this condition. Your health care provider may recommend treatment to prevent problems. You may need treatment if you have symptoms or if you previously had diverticulitis. Treatment may include: Eating a high-fiber diet. Taking a fiber supplement. Taking a live bacteria supplement (probiotic). Taking medicine to relax your colon. Contact a health care provider if you: Have pain in your abdomen. Have bloating. Have cramps. Have not had a bowel movement in 3 days. Get help right away if: Your pain gets worse. Your bloating becomes very bad. You have a fever or chills, and your symptoms suddenly get worse. You vomit. You have bowel movements that are bloody or black. You have bleeding from your rectum. Summary Diverticulosis is a condition that develops when small pouches (diverticula) form in the wall of the large intestine (colon). You may have a few pouches or many of them. This condition is most often diagnosed during an exam for other colon problems. Treatment may include increasing the fiber in your diet, taking supplements, or taking medicines. This information is not intended to replace advice given to you by your health care provider. Make sure you discuss any questions you have with your health care provider. Document Revised: 04/18/2019 Document Reviewed: 04/18/2019 Elsevier Patient Education  2022 [place].  If diarrhea is severe - 4+ watery diarrhea episodes, take imodium to reduce fluid loss, and start on electrolyte supplemented fluids.   Please  go to the ER if you have any severe AB pain, unable to hold down food/water, blood in stool or vomit, chest pain, shortness of breath, or any worsening symptoms.    Consider  keeping a food diary- common causes of diarrhea are dairy, certain carbs...   FODMAP stands for fermentable oligo-, di-, mono-saccharides and polyols (1). These are the scientific terms used to classify groups of carbs that are notorious for triggering digestive symptoms like bloating, gas and stomach pain.   FODMAPs are found in a wide range of foods in varying amounts. Some foods contain just one type, while others contain several.  The main dietary sources of the four groups of FODMAPs include:  Oligosaccharides: Wheat, rye, legumes and various fruits and vegetables, such as garlic and onions.  Disaccharides: Milk, yogurt and soft cheese. Lactose is the main carb.  Monosaccharides: Various fruit including figs and mangoes, and sweeteners such as honey and agave nectar. Fructose is the main carb.  Polyols: Certain fruits and vegetables including blackberries and lychee, as well as some low-calorie sweeteners like those in sugar-free gum.   Keep a food diary. This will help you identify foods that cause symptoms. Write down: What you eat and when. What symptoms you have. When symptoms occur in relation to your meals. Avoid foods that cause symptoms. Talk with your dietitian about other ways to get the same nutrients that are in these foods. Eat your meals slowly, in a relaxed setting. Aim to eat 5-6 small meals per day. Do not skip meals. Drink enough fluids to keep your urine clear or pale yellow. If dairy products cause your symptoms to flare up, try eating less of them. You might be able to handle yogurt better than other dairy products because it contains bacteria that help with digestion.

## 2021-11-22 NOTE — Progress Notes (Signed)
BM's were after eating and going 4-5 x a day, now doing 2-3 BM's.  Had loose, mushy stools, now more formed.  Yesterday was better than day before for leakage, slowly improving.  Clear leakage, feels wetness but nothing there. No fecal leakage.  Just on buttocks, no rectal pain.

## 2021-11-23 ENCOUNTER — Encounter: Payer: Self-pay | Admitting: Internal Medicine

## 2021-11-23 ENCOUNTER — Encounter: Payer: Self-pay | Admitting: Adult Health

## 2021-11-23 ENCOUNTER — Telehealth: Payer: Self-pay | Admitting: Adult Health

## 2021-11-23 ENCOUNTER — Ambulatory Visit: Payer: 59 | Admitting: Adult Health

## 2021-11-23 VITALS — BP 138/77 | HR 69 | Ht 68.0 in | Wt 209.4 lb

## 2021-11-23 DIAGNOSIS — R413 Other amnesia: Secondary | ICD-10-CM | POA: Diagnosis not present

## 2021-11-23 DIAGNOSIS — G4733 Obstructive sleep apnea (adult) (pediatric): Secondary | ICD-10-CM

## 2021-11-23 DIAGNOSIS — G479 Sleep disorder, unspecified: Secondary | ICD-10-CM | POA: Diagnosis not present

## 2021-11-23 DIAGNOSIS — Z9989 Dependence on other enabling machines and devices: Secondary | ICD-10-CM

## 2021-11-23 LAB — TISSUE TRANSGLUTAMINASE ABS,IGG,IGA
(tTG) Ab, IgA: <1 U/mL
(tTG) Ab, IgG: <1 U/mL

## 2021-11-23 LAB — IGA: Immunoglobulin A: 185 mg/dL (ref 47–310)

## 2021-11-23 NOTE — Progress Notes (Signed)
Assessment and plan as noted 

## 2021-11-23 NOTE — Telephone Encounter (Signed)
Pt is scheduled for 11/25/21 with Dr. Jenny Reichmann

## 2021-11-23 NOTE — Telephone Encounter (Signed)
Pt would like a call from the nurse. DME has not received prescription for CPAP machine

## 2021-11-23 NOTE — Telephone Encounter (Signed)
Spoke with pt. Faxed to [facility]. Received a receipt of confirmation. Pt aware and his questions were answered. He will be in touch with [facility].

## 2021-11-23 NOTE — Progress Notes (Signed)
PATIENT: Nathaniel [name] DOB: [birth year]  REASON FOR VISIT: follow up HISTORY FROM: patient PRIMARY NEUROLOGIST:   HISTORY OF PRESENT ILLNESS: Today 11/23/21:  Nathaniel [name] is a 56 year old male with a history of obstructive sleep apnea on CPAP.  He returns today for follow-up.  He reports that he is purchasing a travel machine through a [employer] manager.  He has a prescription today for signature.  His download is below  Patient states that he takes Unisom nightly to help with insomnia.  Wondering if there is a better option for him.  Patient also notes that he has been having some trouble with his memory specifically recall.  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       REVIEW OF SYSTEMS: Out of a complete 14 system review of symptoms, the patient complains only of the following symptoms, and all other reviewed systems are negative.   ESS0  ALLERGIES: Allergies  Allergen Reactions   Amoxicillin-Pot Clavulanate Nausea And Vomiting    REACTION: nausea/GI upset    HOME MEDICATIONS: Outpatient Medications Prior to Visit  Medication Sig Dispense Refill   fluticasone (FLONASE) 50 MCG/ACT nasal spray Place into both nostrils daily.     losartan (COZAAR) 50 MG tablet Take 1 tablet (50 mg total) by mouth daily. 90 tablet 3   tadalafil (CIALIS) 5 MG tablet Take 5 mg by mouth daily as needed for erectile dysfunction.     No facility-administered medications prior to visit.    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   GERD 07/31/2010   HYPERLIPIDEMIA 08/01/2007   Hypertension    INSOMNIA 10/17/2007   Memory loss 11/23/2007   PSA, INCREASED 01/06/2010    SLEEP APNEA, OBSTRUCTIVE, SEVERE 05/03/2007   uses CPAP nightly    PAST SURGICAL HISTORY: Past Surgical History:  Procedure Laterality Date   NASAL SINUS SURGERY     dr Janace Hoard   ORIF ANKLE FRACTURE Right 05/04/2020   Procedure: OPEN REDUCTION INTERNAL FIXATION (ORIF) ANKLE FRACTURE;  Surgeon: Netta Cedars, MD;  Location: [place];  Service: Orthopedics;  Laterality: Right;   TONSILLECTOMY      FAMILY HISTORY: Family History  Problem Relation Age of Onset   Sleep apnea Father    Liver disease Sister    Cancer Maternal Grandfather        prostate cancer   Colon cancer Neg Hx    Rectal cancer Neg Hx    Stomach cancer Neg Hx     SOCIAL HISTORY: Social History   Socioeconomic History   Marital status: Divorced    Spouse name: Not on file   Number of children: Not on file   Years of education: Not on file   Highest education level: Not on file  Occupational History   Not on file  Tobacco Use   Smoking status: Never   Smokeless tobacco: Never  Substance and Sexual Activity   Alcohol use: Yes    Alcohol/week: 4.0 standard drinks    Types: 4 Cans of beer per week    Comment: socially on weekends   Drug use: No   Sexual activity: Not on file  Other Topics Concern  Not on file  Social History Narrative   Not on file   Social Determinants of Health   Financial Resource Strain: Not on file  Food Insecurity: Not on file  Transportation Needs: Not on file  Physical Activity: Not on file  Stress: Not on file  Social Connections: Not on file  Intimate Partner Violence: Not on file      PHYSICAL EXAM  Vitals:   11/23/21 0752  BP: 138/77  Pulse: 69  Weight: 209 lb 6.4 oz (95 kg)  Height: 5\' 8"  (1.727 m)   Body mass index is 31.84 kg/m.  Montreal Cognitive Assessment  11/23/2021  Visuospatial/ Executive (0/5) 4  Naming (0/3) 3  Attention: Read list of digits (0/2) 2  Attention: Read list of letters (0/1) 1  Attention: Serial 7 subtraction  starting at 100 (0/3) 2  Language: Repeat phrase (0/2) 2  Language : Fluency (0/1) 0  Abstraction (0/2) 2  Delayed Recall (0/5) 3  Orientation (0/6) 6  Total 25     Generalized: Well developed, in no acute distress  Chest: Lungs clear to auscultation bilaterally  Neurological examination  Mentation: Alert oriented to time, place, history taking. Follows all commands speech and language fluent Cranial nerve II-XII: Extraocular movements were full, visual field were full on confrontational test Head turning and shoulder shrug  were normal and symmetric. Motor: The motor testing reveals 5 over 5 strength of all 4 extremities. Good symmetric motor tone is noted throughout.  Sensory: Sensory testing is intact to soft touch on all 4 extremities. No evidence of extinction is noted.  Gait and station: Gait is normal.    DIAGNOSTIC DATA (LABS, IMAGING, TESTING) - I reviewed patient records, labs, notes, testing and imaging myself where available.  Lab Results  Component Value Date   WBC 6.7 06/24/2021   HGB 16.5 06/24/2021   HCT 48.4 06/24/2021   MCV 90.5 06/24/2021   PLT 251.0 06/24/2021      Component Value Date/Time   NA 137 06/24/2021 1426   K 4.3 06/24/2021 1426   CL 101 06/24/2021 1426   CO2 26 06/24/2021 1426   GLUCOSE 104 (H) 06/24/2021 1426   BUN 16 06/24/2021 1426   CREATININE 0.99 06/24/2021 1426   CALCIUM 9.5 06/24/2021 1426   PROT 7.3 06/24/2021 1426   ALBUMIN 4.7 06/24/2021 1426   AST 23 06/24/2021 1426   ALT 28 06/24/2021 1426   ALKPHOS 80 06/24/2021 1426   BILITOT 0.7 06/24/2021 1426   GFRNONAA >60 04/29/2017 0608   GFRAA >60 04/29/2017 0608   Lab Results  Component Value Date   CHOL 157 08/24/2020   HDL 39.50 08/24/2020   LDLCALC 87 08/24/2020   LDLDIRECT 110.0 10/05/2016   TRIG 150.0 (H) 08/24/2020   CHOLHDL 4 08/24/2020   Lab Results  Component Value Date   HGBA1C 5.3 12/24/2020   Lab Results  Component Value Date   VITAMINB12 273 08/24/2020    Lab Results  Component Value Date   TSH 1.91 08/24/2020      ASSESSMENT AND PLAN 56 y.o. year old male  has a past medical history of ADD (05/03/2007), ALLERGIC RHINITIS (11/25/2007), ANXIETY (05/03/2007), CHEST PAIN (07/28/2010), Closed right ankle fracture, Diverticulitis large intestine w/o perforation or abscess w/bleeding, Diverticulosis, ERECTILE DYSFUNCTION, ORGANIC (01/06/2010), GERD (07/31/2010), HYPERLIPIDEMIA (08/01/2007), Hypertension, INSOMNIA (10/17/2007), Memory loss (11/23/2007), PSA, INCREASED (01/06/2010), and SLEEP APNEA, OBSTRUCTIVE, SEVERE (05/03/2007). here with:  OSA on CPAP  - CPAP compliance excellent - Good treatment of AHI  -  Encourage patient to use CPAP nightly and > 4 hours each night -Order for travel CPAP signed by Dr. Brett Fairy - F/U in 1 year or sooner if needed  2.  Insomnia  -Advised that he could try taking over-the-counter melatonin starting at 3 mg max of 10 mg 1 to 2 hours before bedtime -If this is not beneficial may consider trazodone  3. Memory disturbance  - MOCA 25/30 -We will monitor for now  Follow-up in 1 year or sooner if needed  Ward Givens, MSN, NP-C 11/23/2021, 8:04 AM [facility] [address] [place], [place] [address] [phone]

## 2021-11-25 ENCOUNTER — Ambulatory Visit (INDEPENDENT_AMBULATORY_CARE_PROVIDER_SITE_OTHER): Payer: 59 | Admitting: Internal Medicine

## 2021-11-25 ENCOUNTER — Encounter: Payer: Self-pay | Admitting: Internal Medicine

## 2021-11-25 ENCOUNTER — Other Ambulatory Visit: Payer: Self-pay

## 2021-11-25 VITALS — BP 120/80 | HR 87 | Ht 68.0 in | Wt 209.0 lb

## 2021-11-25 DIAGNOSIS — E7849 Other hyperlipidemia: Secondary | ICD-10-CM

## 2021-11-25 DIAGNOSIS — E559 Vitamin D deficiency, unspecified: Secondary | ICD-10-CM

## 2021-11-25 DIAGNOSIS — E538 Deficiency of other specified B group vitamins: Secondary | ICD-10-CM

## 2021-11-25 DIAGNOSIS — R739 Hyperglycemia, unspecified: Secondary | ICD-10-CM

## 2021-11-25 DIAGNOSIS — R413 Other amnesia: Secondary | ICD-10-CM

## 2021-11-25 DIAGNOSIS — F5101 Primary insomnia: Secondary | ICD-10-CM

## 2021-11-25 DIAGNOSIS — G4733 Obstructive sleep apnea (adult) (pediatric): Secondary | ICD-10-CM | POA: Diagnosis not present

## 2021-11-25 DIAGNOSIS — Z0001 Encounter for general adult medical examination with abnormal findings: Secondary | ICD-10-CM

## 2021-11-25 DIAGNOSIS — I10 Essential (primary) hypertension: Secondary | ICD-10-CM

## 2021-11-25 LAB — LIPID PANEL
Cholesterol: 156 mg/dL (ref 0–200)
HDL: 42.7 mg/dL (ref >39.00)
LDL Cholesterol: 82 mg/dL (ref 0–99)
NonHDL: 113.21
Total CHOL/HDL Ratio: 4
Triglycerides: 158 mg/dL — ABNORMAL HIGH (ref 0.0–149.0)
VLDL: 31.6 mg/dL (ref 0.0–40.0)

## 2021-11-25 LAB — HEPATIC FUNCTION PANEL
ALT: 25 U/L (ref 0–53)
AST: 21 U/L (ref 0–37)
Albumin: 5.1 g/dL (ref 3.5–5.2)
Alkaline Phosphatase: 84 U/L (ref 39–117)
Bilirubin, Direct: 0.1 mg/dL (ref 0.0–0.3)
Total Bilirubin: 0.7 mg/dL (ref 0.2–1.2)
Total Protein: 7.6 g/dL (ref 6.0–8.3)

## 2021-11-25 LAB — CBC WITH DIFFERENTIAL/PLATELET
Basophils Absolute: 0.1 K/uL (ref 0.0–0.1)
Basophils Relative: 0.8 % (ref 0.0–3.0)
Eosinophils Absolute: 0.1 K/uL (ref 0.0–0.7)
Eosinophils Relative: 1.4 % (ref 0.0–5.0)
HCT: 48.6 % (ref 39.0–52.0)
Hemoglobin: 16.3 g/dL (ref 13.0–17.0)
Lymphocytes Relative: 22.4 % (ref 12.0–46.0)
Lymphs Abs: 1.7 K/uL (ref 0.7–4.0)
MCHC: 33.6 g/dL (ref 30.0–36.0)
MCV: 90.3 fl (ref 78.0–100.0)
Monocytes Absolute: 0.7 K/uL (ref 0.1–1.0)
Monocytes Relative: 10 % (ref 3.0–12.0)
Neutro Abs: 4.9 K/uL (ref 1.4–7.7)
Neutrophils Relative %: 65.4 % (ref 43.0–77.0)
Platelets: 244 K/uL (ref 150.0–400.0)
RBC: 5.38 Mil/uL (ref 4.22–5.81)
RDW: 13.3 % (ref 11.5–15.5)
WBC: 7.4 K/uL (ref 4.0–10.5)

## 2021-11-25 LAB — URINALYSIS, ROUTINE W REFLEX MICROSCOPIC
Bilirubin Urine: NEGATIVE
Hgb urine dipstick: NEGATIVE
Ketones, ur: NEGATIVE
Leukocytes,Ua: NEGATIVE
Nitrite: NEGATIVE
RBC / HPF: NONE SEEN
Specific Gravity, Urine: <=1.005 — AB (ref 1.000–1.030)
Total Protein, Urine: NEGATIVE
Urine Glucose: NEGATIVE
Urobilinogen, UA: 0.2 (ref 0.0–1.0)
pH: 5.5 (ref 5.0–8.0)

## 2021-11-25 LAB — BASIC METABOLIC PANEL WITH GFR
BUN: 15 mg/dL (ref 6–23)
CO2: 32 meq/L (ref 19–32)
Calcium: 9.9 mg/dL (ref 8.4–10.5)
Chloride: 101 meq/L (ref 96–112)
Creatinine, Ser: 1.05 mg/dL (ref 0.40–1.50)
GFR: 78.57 mL/min
Glucose, Bld: 99 mg/dL (ref 70–99)
Potassium: 4.2 meq/L (ref 3.5–5.1)
Sodium: 139 meq/L (ref 135–145)

## 2021-11-25 LAB — VITAMIN D 25 HYDROXY (VIT D DEFICIENCY, FRACTURES): VITD: 30.53 ng/mL (ref 30.00–100.00)

## 2021-11-25 LAB — PSA: PSA: 0.58 ng/mL (ref 0.10–4.00)

## 2021-11-25 LAB — TSH: TSH: 2.54 u[IU]/mL (ref 0.35–5.50)

## 2021-11-25 LAB — HEMOGLOBIN A1C: Hgb A1c MFr Bld: 5.2 % (ref 4.6–6.5)

## 2021-11-25 LAB — VITAMIN B12: Vitamin B-12: 441 pg/mL (ref 211–911)

## 2021-11-25 MED ORDER — BELSOMRA 10 MG PO TABS: ORAL_TABLET | ORAL | 3 refills | Status: AC

## 2021-11-25 NOTE — Assessment & Plan Note (Signed)
Mild worsening uncontrolled, for add belsomra prn,  to f/u any worsening symptoms or concerns

## 2021-11-25 NOTE — Patient Instructions (Signed)
Please take all new medication as prescribed- the belsomra for sleep as needed  Please continue all other medications as before, and refills have been done if requested.  Please have the pharmacy call with any other refills you may need.  Please continue your efforts at being more active, low cholesterol diet, and weight control.  You are otherwise up to date with prevention measures today.  Please keep your appointments with your specialists as you may have planned  You will be contacted regarding the referral for: MRI  Please go to the LAB at the blood drawing area for the tests to be done  You will be contacted by phone if any changes need to be made immediately.  Otherwise, you will receive a letter about your results with an explanation, but please check with MyChart first.  Please remember to sign up for MyChart if you have not done so, as this will be important to you in the future with finding out test results, communicating by private email, and scheduling acute appointments online when needed.  Please make an Appointment to return for your 1 year visit, or sooner if needed

## 2021-11-25 NOTE — Assessment & Plan Note (Signed)
Last vitamin D Lab Results  Component Value Date   VD25OH 28.70 (L) 08/24/2020   Low, to start oral replacement

## 2021-11-25 NOTE — Assessment & Plan Note (Signed)
BP Readings from Last 3 Encounters:  11/25/21 120/80  11/23/21 138/77  11/22/21 118/70   Stable, pt to continue medical treatment losartan

## 2021-11-25 NOTE — Assessment & Plan Note (Signed)
Lab Results  Component Value Date   HGBA1C 5.2 11/25/2021   Stable, pt to continue current medical treatment  - diet

## 2021-11-25 NOTE — Assessment & Plan Note (Signed)
Etiology unclear, for MRI and vit d and b12 levels, consider neuropsych referral

## 2021-11-25 NOTE — Assessment & Plan Note (Signed)
Lab Results  Component Value Date   LDLCALC 82 11/25/2021   Stable, pt to continue current statin low chol diet

## 2021-11-25 NOTE — Progress Notes (Signed)
Patient ID: Nathaniel [name], male   DOB: [birth year], 56 y.o.   MRN: [id number]         Chief Complaint:: wellness exam and Follow-up (F/u for memory issues)  , insomnia, hyperglycemia, hld, vit d deficiency       HPI:  Nathaniel [name] is a 56 y.o. male here for wellness exam; declines flu shot, shingrix o/w up to date                        Also has ongoign anxiety.  Has also OSA followed per Neurology, and wonders if either or both affecting memory, but has had good success with the new CPAP  machine after going 10 yrs without tx, and memory issue persists  .  Has xanax and uses only very rarely.  Taking vit d.  Did see neurology but not evaluated in depth per pt.  Having difficulty with remembering spelling or words he should know but used in a while, and names of persons he used to know.  MME reported at 25/30 per pt but still bothers him.  Was suggested to consider neuropsych referral.      Wt Readings from Last 3 Encounters:  11/25/21 209 lb (94.8 kg)  11/23/21 209 lb 6.4 oz (95 kg)  11/22/21 210 lb (95.3 kg)   BP Readings from Last 3 Encounters:  11/25/21 120/80  11/23/21 138/77  11/22/21 118/70   Immunization History  Administered Date(s) Administered   Influenza Split 09/13/2012, 07/30/2015, 09/05/2016, 10/05/2016, 06/27/2017, 09/05/2018   Influenza Whole 07/14/2006, 06/27/2008, 09/10/2009, 07/27/2010   Influenza, Seasonal, Injecte, Preservative Fre 09/13/2012   Influenza,inj,Quad PF,6+ Mos 07/30/2015, 10/05/2016, 06/27/2017, 08/24/2020   Influenza-Unspecified 09/05/2016, 09/05/2018   PFIZER(Purple Top)SARS-COV-2 Vaccination 03/05/2020, 04/02/2020   Td 05/03/2007   Tdap 10/05/2016   There are no preventive care reminders to display for this patient.     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 He has never used smokeless tobacco. He reports current alcohol use of about 4.0 standard drinks per week. He reports that he does not use drugs. family history includes Cancer in his maternal grandfather; Liver disease in his sister; Sleep apnea in his father. Allergies  Allergen Reactions   Amoxicillin-Pot Clavulanate Nausea And Vomiting    REACTION: nausea/GI upset   Current Outpatient Medications on File Prior to Visit  Medication Sig Dispense Refill   fluticasone (FLONASE) 50 MCG/ACT nasal spray Place into both nostrils daily.     losartan (COZAAR) 50 MG tablet Take 1 tablet (50 mg total) by mouth daily. 90 tablet 3  tadalafil (CIALIS) 5 MG tablet Take 5 mg by mouth daily as needed for erectile dysfunction.     No current facility-administered medications on file prior to visit.        ROS:  All others reviewed and negative.  Objective        PE:  BP 120/80 (BP Location: Left Arm, Patient Position: Sitting, Cuff Size: Large)    Pulse 87    Ht 5\' 8"  (1.727 m)    Wt 209 lb (94.8 kg)    SpO2 96%    BMI 31.78 kg/m                 Constitutional: Pt appears in NAD               HENT: Head: NCAT.                Right Ear: External ear normal.                 Left Ear: External ear normal.                Eyes: . Pupils are equal, round, and reactive  to light. Conjunctivae and EOM are normal               Nose: without d/c or deformity               Neck: Neck supple. Gross normal ROM               Cardiovascular: Normal rate and regular rhythm.                 Pulmonary/Chest: Effort normal and breath sounds without rales or wheezing.                Abd:  Soft, NT, ND, + BS, no organomegaly               Neurological: Pt is alert. At baseline orientation, motor grossly intact               Skin: Skin is warm. No rashes, no other new lesions, LE edema - none               Psychiatric: Pt behavior is normal without agitation   Micro: none  Cardiac tracings I have personally interpreted today:  none  Pertinent Radiological findings (summarize): none   Lab Results  Component Value Date   WBC 7.4 11/25/2021   HGB 16.3 11/25/2021   HCT 48.6 11/25/2021   PLT 244.0 11/25/2021   GLUCOSE 99 11/25/2021   CHOL 156 11/25/2021   TRIG 158.0 (H) 11/25/2021   HDL 42.70 11/25/2021   LDLDIRECT 110.0 10/05/2016   LDLCALC 82 11/25/2021   ALT 25 11/25/2021   AST 21 11/25/2021   NA 139 11/25/2021   K 4.2 11/25/2021   CL 101 11/25/2021   CREATININE 1.05 11/25/2021   BUN 15 11/25/2021   CO2 32 11/25/2021   TSH 2.54 11/25/2021   PSA 0.58 11/25/2021   HGBA1C 5.2 11/25/2021   Assessment/Plan:  Nathaniel [name] is a 56 y.o. White or Caucasian [1] male with  has a past medical history of ADD (05/03/2007), ALLERGIC RHINITIS (11/25/2007), ANXIETY (05/03/2007), CHEST PAIN (07/28/2010), Closed right ankle fracture, Diverticulitis large intestine w/o perforation or abscess w/bleeding, Diverticulosis, ERECTILE DYSFUNCTION, ORGANIC (01/06/2010), GERD (07/31/2010), HYPERLIPIDEMIA (08/01/2007), Hypertension, INSOMNIA (10/17/2007), Memory loss (11/23/2007), PSA, INCREASED (01/06/2010), and SLEEP APNEA, OBSTRUCTIVE, SEVERE (05/03/2007).  Vitamin D deficiency Last  vitamin D Lab Results  Component Value Date   VD25OH 28.70 (L) 08/24/2020   Low, to start oral  replacement   Encounter for well adult exam with abnormal findings Age and sex appropriate education and counseling updated with regular exercise and diet Referrals for preventative services - none needed Immunizations addressed - declines flu shot, shingrix Smoking counseling  - none needed Evidence for depression or other mood disorder - none significant Most recent labs reviewed. I have personally reviewed and have noted: 1) the patient's medical and social history 2) The patient's current medications and supplements 3) The patient's height, weight, and BMI have been recorded in the chart   Hyperglycemia Lab Results  Component Value Date   HGBA1C 5.2 11/25/2021   Stable, pt to continue current medical treatment  - diet   Essential hypertension BP Readings from Last 3 Encounters:  11/25/21 120/80  11/23/21 138/77  11/22/21 118/70   Stable, pt to continue medical treatment losartan  Hyperlipidemia Lab Results  Component Value Date   LDLCALC 82 11/25/2021   Stable, pt to continue current statin low chol diet   Memory loss Etiology unclear, for MRI and vit d and b12 levels, consider neuropsych referral  INSOMNIA Mild worsening uncontrolled, for add belsomra prn,  to f/u any worsening symptoms or concerns  Followup: Return in about 1 year (around 11/25/2022).  [name], MD 11/25/2021 9:27 PM [facility]

## 2021-11-25 NOTE — Assessment & Plan Note (Signed)
Age and sex appropriate education and counseling updated with regular exercise and diet Referrals for preventative services - none needed Immunizations addressed - declines flu shot, shingrix Smoking counseling  - none needed Evidence for depression or other mood disorder - none significant Most recent labs reviewed. I have personally reviewed and have noted: 1) the patient's medical and social history 2) The patient's current medications and supplements 3) The patient's height, weight, and BMI have been recorded in the chart

## 2021-12-03 ENCOUNTER — Encounter: Payer: Self-pay | Admitting: Internal Medicine

## 2021-12-07 ENCOUNTER — Encounter: Payer: Self-pay | Admitting: Internal Medicine

## 2021-12-08 NOTE — Telephone Encounter (Signed)
Ok to contact pt ? ?Xray per GI showed possible left ureter (kidney) stone, but this is not clear ? ?Recent UA 2 wks ago did NOT show blood ? ?I would lean to Not ordering the CT scan to further evaluate, but I would if the patient wants to check this as possible source of pain ? ?Please let me know if pt wants me to order the CT scan to check for possible kidney stone on the left causing pain ?

## 2021-12-09 ENCOUNTER — Ambulatory Visit: Payer: 59 | Admitting: Nurse Practitioner

## 2021-12-21 ENCOUNTER — Ambulatory Visit
Admission: RE | Admit: 2021-12-21 | Discharge: 2021-12-21 | Disposition: A | Discharge status: Home / Self Care | Payer: 59 | Source: Ambulatory Visit | Attending: Internal Medicine | Admitting: Internal Medicine

## 2021-12-21 ENCOUNTER — Other Ambulatory Visit: Payer: Self-pay | Admitting: Internal Medicine

## 2021-12-21 ENCOUNTER — Other Ambulatory Visit: Payer: Self-pay

## 2021-12-21 DIAGNOSIS — R413 Other amnesia: Secondary | ICD-10-CM | POA: Diagnosis not present

## 2021-12-21 MED ORDER — ASPIRIN 81 MG PO TBEC: 81.0000 mg | DELAYED_RELEASE_TABLET | Freq: Every day | ORAL | 12 refills | Status: AC

## 2021-12-22 ENCOUNTER — Encounter: Payer: Self-pay | Admitting: Neurology

## 2021-12-23 DIAGNOSIS — G4733 Obstructive sleep apnea (adult) (pediatric): Secondary | ICD-10-CM | POA: Diagnosis not present

## 2022-02-08 ENCOUNTER — Ambulatory Visit: Payer: Self-pay | Admitting: Neurology

## 2022-02-17 ENCOUNTER — Other Ambulatory Visit: Payer: Self-pay | Admitting: Internal Medicine

## 2022-02-17 NOTE — Telephone Encounter (Signed)
Please refill as per office routine med refill policy (all routine meds to be refilled for 3 mo or monthly (per pt preference) up to one year from last visit, then month to month grace period for 3 mo, then further med refills will have to be denied) ? ?

## 2022-02-21 ENCOUNTER — Encounter: Payer: Self-pay | Admitting: Adult Health

## 2022-03-10 ENCOUNTER — Encounter: Payer: Self-pay | Admitting: Internal Medicine

## 2022-03-16 MED ORDER — TADALAFIL 5 MG PO TABS: 5.0000 mg | ORAL_TABLET | Freq: Every day | ORAL | 3 refills | Status: DC | PRN

## 2022-05-09 ENCOUNTER — Encounter: Payer: Self-pay | Admitting: Internal Medicine

## 2022-05-10 NOTE — Progress Notes (Unsigned)
05/11/2022 Nathaniel [name] [id number] 31-Dec-1965   ASSESSMENT AND PLAN:   Loose stools with lower AB pain, bloating and anal leakage IBS-D - recent normal colonoscopy 07/2021 with Dr. Henrene Pastor- Recall 5 years - negative celiac - continue IBGARD, citracel - will do trial of xifaxin for IBS-D/SIBO, if not helping can consider pelvic floor PT versus anal rectal manometry Had some tight rectal tone, unknown if clenching versus increased rectal tone. Has some increased urinary frequency, feels he never empties bladder-? Pelvic floor issue? - consider repeat CT versus lumbar Xray.   Will set up next OV with Dr. Henrene Pastor.  Future Appointments  Date Time Provider [place]  07/27/2022  2:00 PM Irene Shipper, MD [facility]    History of Present Illness:  56 y.o. male  with a past medical history of OSA on CPAP, HTN, diverticulosis with history of perforation, BPH, depression,  and others listed below, known to Dr. Henrene Pastor returns to clinic today for evaluation of change in bowel habits.  Patient last seen in the office 11/22/2021 for change in bowel habits.  Normal colonoscopy 07/2021. Told to start Citrucel and IBgard, bentyl as needed, IBGARD helped the AB pain the most.   Patient had negative celiac panel. KUB showed moderate colonic stool burden.   Suggested doing MiraLAX, did for a little while.  Declined rectal exam last time.   Thought his symptoms were improving but then got worse.  States symptoms are worse in the evening after dinner.  Continues to feel he has rectal leakage, can feel wetness and there can be very small stain of stool, no large volume.  No rectal itching, no pain, no hematochezia.   He has BM regularly per patient but states after every meal, has loose stools but still formed.  He has AB bloating.  Denies weight loss.   Previous GI history: 07/19/2021 colonoscopy for change in bowel habits, loose stools showed 3 polyps ranging from 3 to 5 mm, 2  polyps were tubular adenoma without high-grade dysplasia, sessile serrated polyp, hyperplastic polyp, diverticulosis, internal hemorrhoids.  Recall 5 years Normal CRP 06/2021 negative GI pathogen, C. Difficile June 24, 2014 colonoscopy  mild left-sided diverticulosis.  The colon and terminal ileum were otherwise normal.   Routine follow-up in 10 years recommended.  12/05/2019 CT abdomen and pelvis for abdominal pain 1. Persistent right mid and distal hydroureter extending to the level of the UVJ, without an obvious mass or stone identified. This could be due to chronic mild stricturing at the right UVJ or a mild congenital megaureter. 2. 1 mm left kidney lower pole nonobstructive renal calculus. 3. Considerable central narrowing of the thecal sac of the L4-5 level due to degenerative facet arthropathy and 4 mm degenerative anterolisthesis. 4. Several small sub-centimeter hepatic hypodense lesions are unchanged from 04/26/2017 and most likely benign. 5. Mild prostatomegaly. 6. Small umbilical hernia contains adipose tissue. 7. Sigmoid diverticula noted, without active diverticulitis.  No results found.  Current Medications:    Current Outpatient Medications (Cardiovascular):    losartan (COZAAR) 50 MG tablet, TAKE ONE TABLET BY MOUTH DAILY   tadalafil (CIALIS) 5 MG tablet, Take 1 tablet (5 mg total) by mouth daily as needed for erectile dysfunction.  Current Outpatient Medications (Respiratory):    fluticasone (FLONASE) 50 MCG/ACT nasal spray, Place into both nostrils daily.  Current Outpatient Medications (Analgesics):    aspirin 81 MG EC tablet, Take 1 tablet (81 mg total) by mouth daily. Swallow whole.  Current Outpatient Medications (Other):    rifaximin (XIFAXAN) 550 MG TABS tablet, Take 1 tablet (550 mg total) by mouth 3 (three) times daily for 14 days.   Suvorexant (BELSOMRA) 10 MG TABS, 1-2 tab by mouth at bedtime as needed   traZODone (DESYREL) 100 MG tablet, Take 1  tablet (100 mg total) by mouth at bedtime.   Surgical History:  He  has a past surgical history that includes Tonsillectomy; Nasal sinus surgery; and ORIF ankle fracture (Right, 05/04/2020). Family History:  His family history includes Cancer in his maternal grandfather; Liver disease in his sister; Sleep apnea in his father. Social History:   reports that he has never smoked. He has never used smokeless tobacco. He reports current alcohol use of about 4.0 standard drinks of alcohol per week. He reports that he does not use drugs.  Current Medications, Allergies, Past Medical History, Past Surgical History, Family History and Social History were reviewed in [employer] record.  Physical Exam: BP 126/68   Pulse 79   Ht '5\' 8"'$  (1.727 m)   Wt 207 lb (93.9 kg)   BMI 31.47 kg/m  General:   Pleasant, well developed male in no acute distress Eyes: sclerae anicteric,conjunctive pink  Heart:  regular rate and rhythm Pulm: Clear anteriorly; no wheezing Abdomen:  Soft, Obese AB, skin exam normal, Normal bowel sounds. mild tenderness in the lower abdomen. Without guarding and Without rebound, without hepatomegaly. Rectal exam: Rectal: Normal external rectal exam small liquid stool at anus, increased rectal tone on exam, difficult exam, negative hemoccult, brown stool.  Extremities:  With edema. Peripheral pulses intact.  Neurologic:  Alert and  oriented x4;  grossly normal neurologically. Skin:   Dry and intact without significant lesions or rashes. Psychiatric: Demonstrates good judgement and reason without abnormal affect or behaviors.  Vladimir Crofts, PA-C 05/11/22

## 2022-05-11 ENCOUNTER — Other Ambulatory Visit (HOSPITAL_COMMUNITY): Payer: Self-pay

## 2022-05-11 ENCOUNTER — Telehealth: Payer: Self-pay | Admitting: Pharmacy Technician

## 2022-05-11 ENCOUNTER — Ambulatory Visit (INDEPENDENT_AMBULATORY_CARE_PROVIDER_SITE_OTHER): Payer: 59 | Admitting: Physician Assistant

## 2022-05-11 ENCOUNTER — Encounter: Payer: Self-pay | Admitting: Internal Medicine

## 2022-05-11 ENCOUNTER — Encounter: Payer: Self-pay | Admitting: Adult Health

## 2022-05-11 ENCOUNTER — Encounter: Payer: Self-pay | Admitting: Physician Assistant

## 2022-05-11 VITALS — BP 126/68 | HR 79 | Ht 68.0 in | Wt 207.0 lb

## 2022-05-11 DIAGNOSIS — K580 Irritable bowel syndrome with diarrhea: Secondary | ICD-10-CM

## 2022-05-11 DIAGNOSIS — R140 Abdominal distension (gaseous): Secondary | ICD-10-CM | POA: Diagnosis not present

## 2022-05-11 DIAGNOSIS — R195 Other fecal abnormalities: Secondary | ICD-10-CM | POA: Diagnosis not present

## 2022-05-11 DIAGNOSIS — R159 Full incontinence of feces: Secondary | ICD-10-CM | POA: Diagnosis not present

## 2022-05-11 DIAGNOSIS — R1084 Generalized abdominal pain: Secondary | ICD-10-CM

## 2022-05-11 MED ORDER — RIFAXIMIN 550 MG PO TABS: 550.0000 mg | ORAL_TABLET | Freq: Three times a day (TID) | ORAL | 0 refills | Status: AC

## 2022-05-11 MED ORDER — TRAZODONE HCL 100 MG PO TABS: 100.0000 mg | ORAL_TABLET | Freq: Every day | ORAL | 1 refills | Status: AC

## 2022-05-11 NOTE — Telephone Encounter (Signed)
[facility] for trazodone 100 mg qhs as needed - done erx

## 2022-05-11 NOTE — Patient Instructions (Addendum)
We may want to evaluate you for small intestinal bacterial overgrowth, this can cause increase gas, bloating, loose stools or constipation.  There is a test for this we can do or sometimes we will treat a patient with an antibiotic to see if it helps.   If this is not better may want to try pelvic floor physical therapy/evaluation  Toileting tips to help with your constipation - Drink at least 64-80 ounces of water/liquid per day. - Establish a time to try to move your bowels every day.  For many people, this is after a cup of coffee or after a meal such as breakfast. - Sit all of the way back on the toilet keeping your back fairly straight and while sitting up, try to rest the tops of your forearms on your upper thighs.   - Raising your feet with a step stool/squatty potty can be helpful to improve the angle that allows your stool to pass through the rectum. - Relax the rectum feeling it bulge toward the toilet water.  If you feel your rectum raising toward your body, you are contracting rather than relaxing. - Breathe in and slowly exhale. "Belly breath" by expanding your belly towards your belly button. Keep belly expanded as you gently direct pressure down and back to the anus.  A low pitched GRRR sound can assist with increasing intra-abdominal pressure.  - Repeat 3-4 times. If unsuccessful, contract the pelvic floor to restore normal tone and get off the toilet.  Avoid excessive straining. - To reduce excessive wiping by teaching your anus to normally contract, place hands on outer aspect of knees and resist knee movement outward.  Hold 5-10 second then place hands just inside of knees and resist inward movement of knees.  Hold 5 seconds.  Repeat a few times each way.  _______________________________________________________  If you are age 36 or older, your body mass index should be between 23-30. Your Body mass index is 31.47 kg/m. If this is out of the aforementioned range listed, please  consider follow up with your Primary Care Provider.  If you are age 58 or younger, your body mass index should be between 19-25. Your Body mass index is 31.47 kg/m. If this is out of the aformentioned range listed, please consider follow up with your Primary Care Provider.   ________________________________________________________  The [facility] providers would like to encourage you to use [facility] to communicate with providers for non-urgent requests or questions.  Due to long hold times on the telephone, sending your provider a message by [facility] may be a faster and more efficient way to get a response.  Please allow 48 business hours for a response.  Please remember that this is for non-urgent requests.  _______________________________________________________   I appreciate the  opportunity to care for you  Thank You   [facility]

## 2022-05-11 NOTE — Telephone Encounter (Addendum)
Patient Advocate Encounter  Prior Authorization for [facility] '550MG'$  has been approved.    PA# HMO [id number] SS Effective dates: 8.9.23 through 8.23.23  Jalan Fariss B. CPhT P: [phone] F: [phone]    Received notification from [employer] that prior authorization for XIFAXAN '550MG'$  is required.   PA submitted on 8.9.23 Key [id number] Status is pending    Luciano Cutter, CPhT Patient Advocate Phone: [phone]

## 2022-05-12 ENCOUNTER — Other Ambulatory Visit: Payer: Self-pay

## 2022-05-12 MED ORDER — DOXYCYCLINE HYCLATE 100 MG PO CAPS: 100.0000 mg | ORAL_CAPSULE | Freq: Two times a day (BID) | ORAL | 0 refills | Status: AC

## 2022-05-12 NOTE — Telephone Encounter (Signed)
Spoke with patient regarding alternative medication. New prescription has been sent to pharmacy.

## 2022-05-12 NOTE — Telephone Encounter (Signed)
If amendable we could do trazodone 25 mg at bedtime

## 2022-05-12 NOTE — Telephone Encounter (Signed)
Actually looks like he is already on trazodone? Could increase to 150 mg Can you verify? Is he having trouble staying asleep or falling asleep. May just need to come in to discuss.

## 2022-05-18 ENCOUNTER — Encounter: Payer: Self-pay | Admitting: Internal Medicine

## 2022-05-23 ENCOUNTER — Other Ambulatory Visit: Payer: Self-pay

## 2022-05-23 ENCOUNTER — Encounter: Payer: Self-pay | Admitting: Internal Medicine

## 2022-05-26 ENCOUNTER — Encounter: Payer: Self-pay | Admitting: Internal Medicine

## 2022-06-16 ENCOUNTER — Encounter: Payer: Self-pay | Admitting: Internal Medicine

## 2022-06-17 MED ORDER — TESTOSTERONE CYPIONATE 200 MG/ML IM SOLN: 200.0000 mg | INTRAMUSCULAR | 1 refills | Status: AC

## 2022-07-04 ENCOUNTER — Encounter: Payer: Self-pay | Admitting: Internal Medicine

## 2022-07-27 ENCOUNTER — Encounter: Payer: Self-pay | Admitting: Internal Medicine

## 2022-07-27 ENCOUNTER — Ambulatory Visit (INDEPENDENT_AMBULATORY_CARE_PROVIDER_SITE_OTHER): Payer: 59 | Admitting: Internal Medicine

## 2022-07-27 VITALS — BP 114/80 | HR 80 | Ht 68.0 in | Wt 213.0 lb

## 2022-07-27 DIAGNOSIS — Z86010 Personal history of colonic polyps: Secondary | ICD-10-CM

## 2022-07-27 DIAGNOSIS — K648 Other hemorrhoids: Secondary | ICD-10-CM | POA: Diagnosis not present

## 2022-07-27 DIAGNOSIS — R143 Flatulence: Secondary | ICD-10-CM | POA: Diagnosis not present

## 2022-07-27 DIAGNOSIS — R151 Fecal smearing: Secondary | ICD-10-CM | POA: Diagnosis not present

## 2022-07-27 MED ORDER — HYDROCORTISONE ACETATE 25 MG RE SUPP: 25.0000 mg | Freq: Every day | RECTAL | 1 refills | Status: AC

## 2022-07-27 MED ORDER — METRONIDAZOLE 250 MG PO TABS: 250.0000 mg | ORAL_TABLET | Freq: Three times a day (TID) | ORAL | 0 refills | Status: DC

## 2022-07-27 NOTE — Patient Instructions (Signed)
_______________________________________________________  If you are age 58 or older, your body mass index should be between 23-30. Your Body mass index is 32.39 kg/m. If this is out of the aforementioned range listed, please consider follow up with your Primary Care Provider.  If you are age 39 or younger, your body mass index should be between 19-25. Your Body mass index is 32.39 kg/m. If this is out of the aformentioned range listed, please consider follow up with your Primary Care Provider.   ________________________________________________________  The  GI providers would like to encourage you to use [facility] to communicate with providers for non-urgent requests or questions.  Due to long hold times on the telephone, sending your provider a message by [facility] may be a faster and more efficient way to get a response.  Please allow 48 business hours for a response.  Please remember that this is for non-urgent requests.  _______________________________________________________  We have sent the following medications to your pharmacy for you to pick up at your convenience:  Anusol suppositories; Metronidazole  Take 2 tablespoons of Citrucel daily

## 2022-07-27 NOTE — Progress Notes (Signed)
HISTORY OF PRESENT ILLNESS:  Nathaniel [name] is a 56 y.o. male with past medical history as listed below who presents today regarding chronic problems with bowel habits, increased gas, and minor fecal leakage.  He was last seen in the office by the GI physician assistant May 11, 2022 regarding the same.  See that dictation.  He continues on IBgard and states that taking that prior to meals helps with abdominal discomfort.  He has been taking Citrucel tablets.  Xifaxan was not affordable.  His rectal exam was unremarkable.  He has had colonoscopy on 2 occasions, most recently October 2022.  At that time he was found to have internal hemorrhoids, sigmoid diverticulosis, and diminutive colon polyps, including 2 tubular adenomas.  Random colon biopsies were normal.  He tells me that he generally has 1 bowel movement per day.  He could have 2 or 3 bowel movements per day.  Sometimes they are formed and solid.  Often they are soft.  Occasionally has what he describes as a "blowout".  He does report increased intestinal gas which is bothersome.  There is a postprandial component at times.  He does not have fecal soiling or incontinence.  Rather, he describes a wet sensation in the anal region several times per day.  He is wondering if this is something serious.  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 He has never used smokeless tobacco. He reports current alcohol use of about 4.0 standard drinks of alcohol per week. He reports that he does not use drugs.  family history includes Atrial fibrillation in his father; Prostate cancer in his maternal grandfather; Sleep apnea in his father.  No Known Allergies     PHYSICAL EXAMINATION: Vital signs: BP 114/80 (BP Location: Left Arm, Patient Position: Sitting, Cuff Size: Normal)   Pulse 80   Ht '5\' 8"'$  (1.727 m)   Wt 213 lb (96.6 kg)   BMI 32.39 kg/m   Constitutional: generally well-appearing, no acute distress Psychiatric: alert and oriented x3, cooperative Eyes: extraocular movements intact, anicteric, conjunctiva pink Mouth: oral pharynx moist, no lesions Neck: supple no lymphadenopathy Cardiovascular: heart regular rate and rhythm, no murmur Lungs: clear to auscultation bilaterally Abdomen: soft, nontender, nondistended, no obvious ascites, no peritoneal signs, normal bowel sounds, no organomegaly.  Small umbilical hernia Rectal: Not repeated.  See August office visit  Extremities: no clubbing, cyanosis, or lower extremity edema bilaterally Skin: no lesions on visible extremities Neuro: No focal  deficits.  Cranial nerves intact  ASSESSMENT:  1.  Fecal leakage as described 2.  Irregular bowel habits 3.  Increased intestinal gas 4.  Colonoscopy October 2022 with diminutive adenomas, diverticulosis, and internal hemorrhoids 5.  Health related anxiety  PLAN:  1.  Citrucel 2 tablespoons daily 2.  Prescribe metronidazole 250 mg 3 times daily x10 days.  Medication effects and side effects  reviewed.  No alcohol while taking this drug.  Advised 3.  Prescribe Anusol HC suppositories.  1 per rectum at night.  This to treat internal hemorrhoids which may contribute to mucus formation. 4.  Routine office follow-up 6 to 8 weeks 5.  Surveillance colonoscopy around October 2027 A total time of 30 minutes was spent preparing to see the patient, obtaining interval history, performing medically appropriate physical examination, counseling and educating the patient regarding the above listed issues, prescribing medications, arranging follow-up, and documenting clinical information in the health record

## 2022-08-01 IMAGING — MR MR HEAD W/O CM
10 series · 48 of 48 positions shown · non-contrast
Comparison: No pertinent prior exams available for comparison.

CLINICAL DATA: Memory loss. Additional history provided by scanning
technologist: Memory loss/confusion for 2-3 years.

EXAM:
MRI HEAD WITHOUT CONTRAST
TECHNIQUE: Multiplanar, multiecho pulse sequences of the brain and surrounding
structures were obtained without intravenous contrast.

[Series 2: T1 · sagittal · 5.0mm · 0.49mm/px · 3 of 22 slices shown]
[im 1/22]
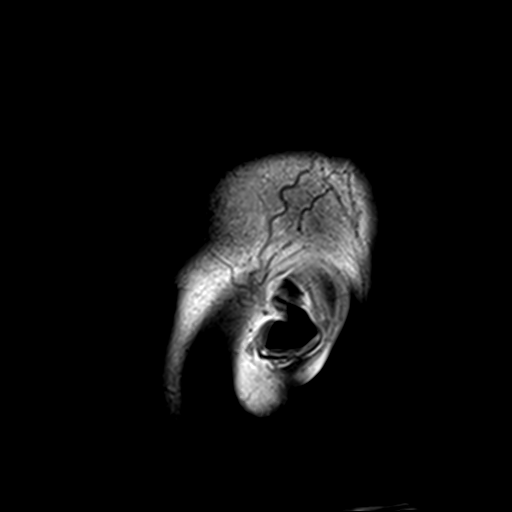
[im 11/22]
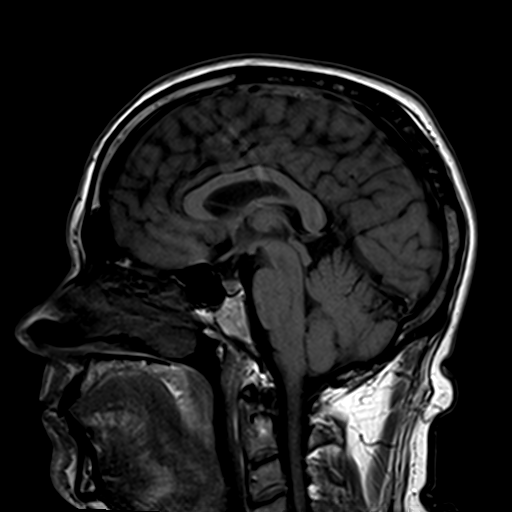
[im 22/22]
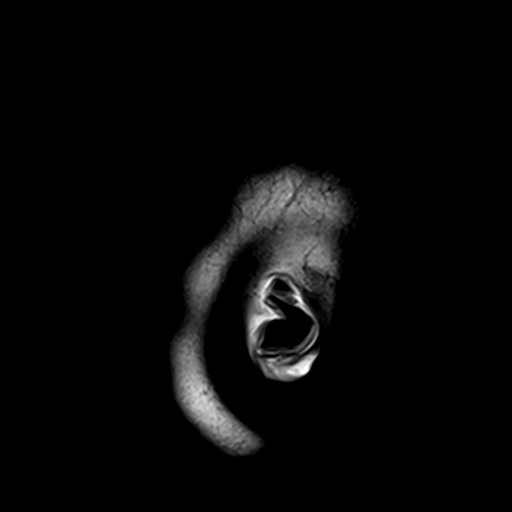

[Series 3: DWI · axial · 3.0mm · 1.88mm/px · z∈[-57,+100]mm · 9 of 108 slices shown (1 of 4)]
[im 1/108]
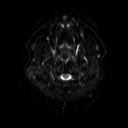
[im 14/108]
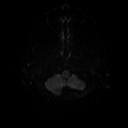
[im 27/108]
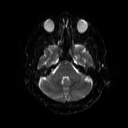
[im 41/108]
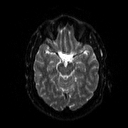
[im 54/108]
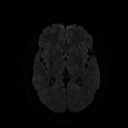
[im 67/108]
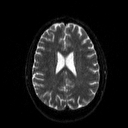
[im 81/108]
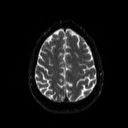
[im 94/108]
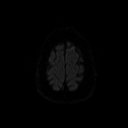
[im 108/108]
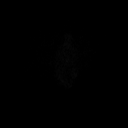

[Series 4: DWI · axial · 3.0mm · 1.88mm/px · z∈[-57,+100]mm · 4 of 52 slices shown (2 of 4)]
[im 1/52]
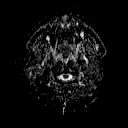
[im 18/52]
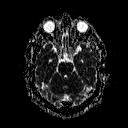
[im 35/52]
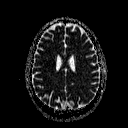
[im 52/52]
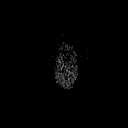

[Series 5: DWI · coronal · 5.0mm · 1.80mm/px · 6 of 74 slices shown (3 of 4)]
[im 1/74]
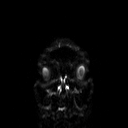
[im 15/74]
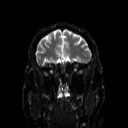
[im 30/74]
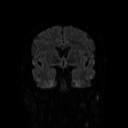
[im 44/74]
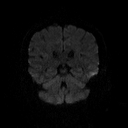
[im 59/74]
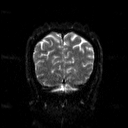
[im 74/74]
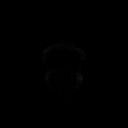

[Series 6: DWI · coronal · 5.0mm · 1.80mm/px · 3 of 37 slices shown (4 of 4)]
[im 1/37]
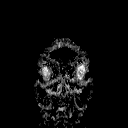
[im 19/37]
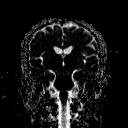
[im 37/37]
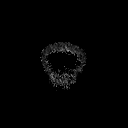

[Series 7: T2 · axial · 5.0mm · 0.62mm/px · z∈[-53,+99]mm · 2 of 23 slices shown (1 of 2)]
[im 1/23]
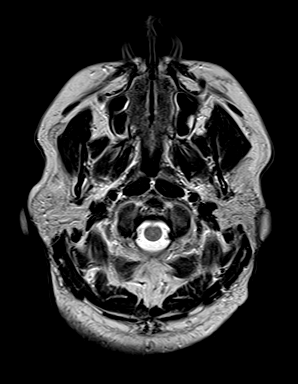
[im 23/23]
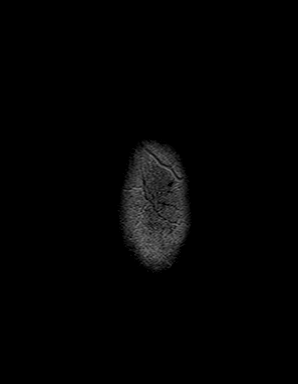

[Series 8: FLAIR · axial · 3.0mm · 0.47mm/px · z∈[-54,+97]mm · 3 of 34 slices shown]
[im 1/34]
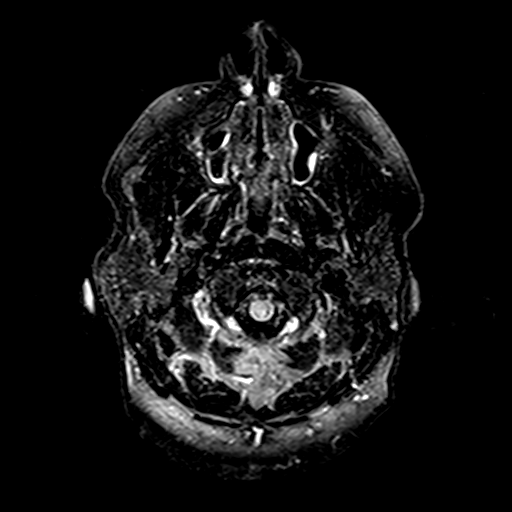
[im 17/34]
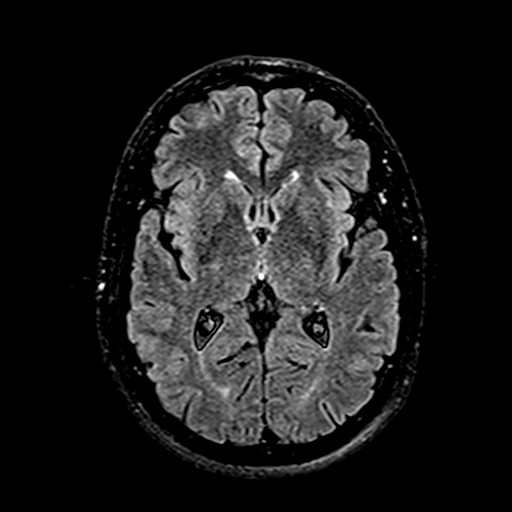
[im 34/34]
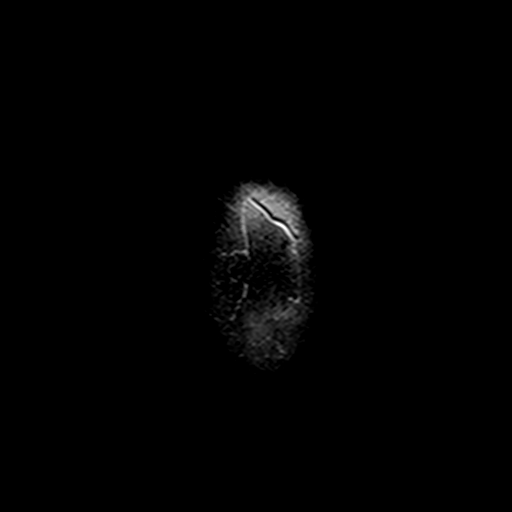

[Series 10: swi_images · axial · 4.0mm · 0.94mm/px · z∈[-55,+99]mm · 3 of 40 slices shown]
[im 1/40]
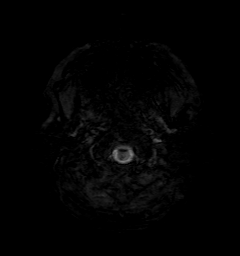
[im 20/40]
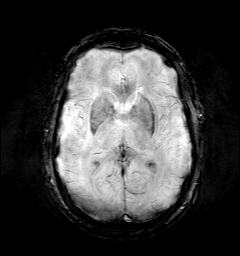
[im 40/40]
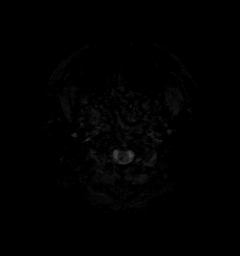

[Series 11: t1_mpr_tra · axial · 1.0mm · 0.75mm/px · z∈[-55,+102]mm · 13 of 160 slices shown]
[im 1/160]
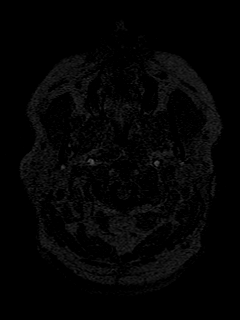
[im 14/160]
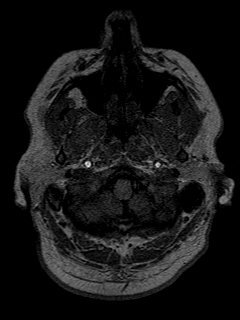
[im 27/160]
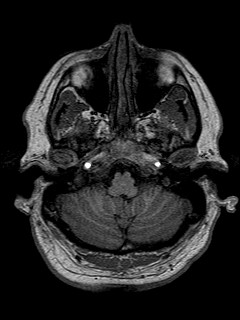
[im 40/160]
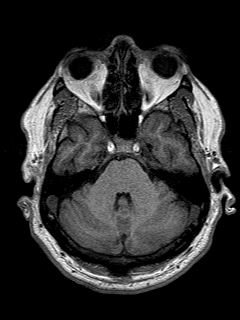
[im 54/160]
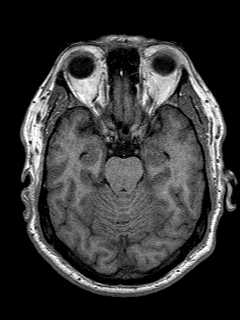
[im 67/160]
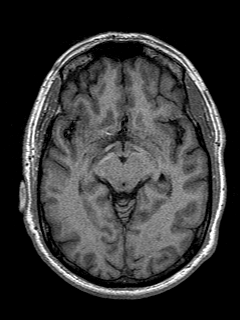
[im 80/160]
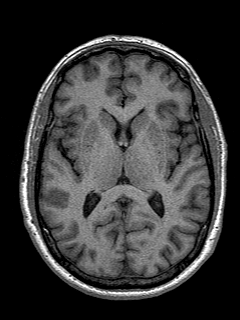
[im 93/160]
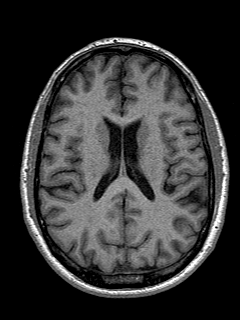
[im 107/160]
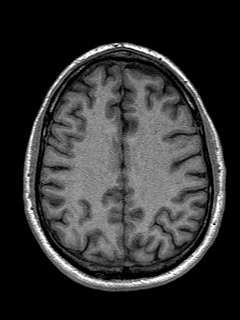
[im 120/160]
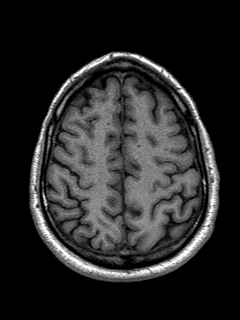
[im 133/160]
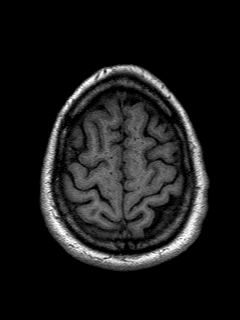
[im 146/160]
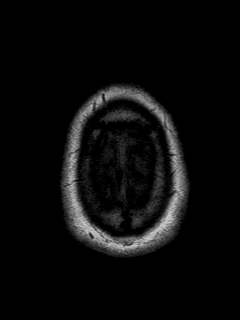
[im 160/160]
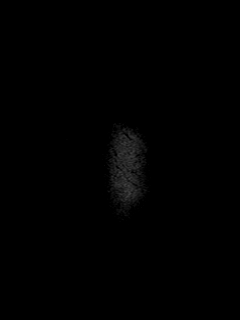

[Series 12: T2 · coronal · 5.0mm · 0.45mm/px · 2 of 29 slices shown (2 of 2)]
[im 1/29]
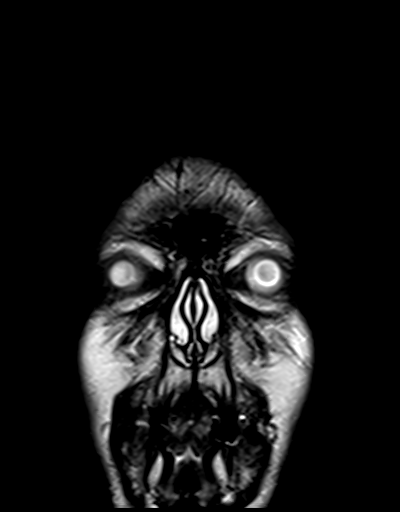
[im 29/29]
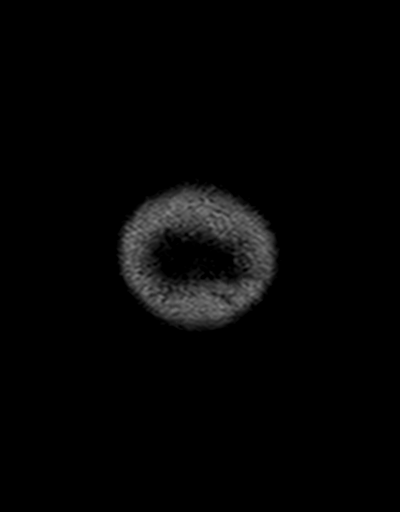

[48 of 48 positions shown; findings below may reference images not displayed]

FINDINGS: Brain:

Cerebral volume appears within normal limits for age.

Mild multifocal T2 FLAIR hyperintense signal abnormality within the
cerebral white matter, nonspecific but most often secondary to
chronic small vessel ischemia.

There is no acute infarct.

No evidence of an intracranial mass.

No chronic intracranial blood products.

No extra-axial fluid collection.

No midline shift.

Vascular: Maintained flow voids within the proximal large arterial
vessels.

Skull and upper cervical spine: No focal suspicious marrow lesion.

Sinuses/Orbits: Visualized orbits show no acute finding. Minimal
mucosal thickening within the bilateral ethmoid and maxillary
sinuses.

Other: Small-volume fluid within the bilateral mastoid air cells.
IMPRESSION: No evidence of acute intracranial abnormality.

Mild multifocal T2 FLAIR hyperintense signal abnormality within the
cerebral white matter, nonspecific but most often secondary to
chronic small vessel ischemia.

Otherwise unremarkable non-contrast MRI appearance of the brain for
age.

Minimal mucosal thickening within the bilateral ethmoid and
maxillary sinuses.

Small-volume fluid within the bilateral mastoid air cells.

## 2022-08-03 ENCOUNTER — Encounter: Payer: Self-pay | Admitting: Internal Medicine

## 2022-08-04 NOTE — Telephone Encounter (Signed)
Please advise, patient is not getting the results he want from 5 mg of Tadalafil ('5mg'$ ) taken once a day as he gets from one sildenafil citrate, is he able to take one in the morning and one at night?

## 2022-08-04 NOTE — Telephone Encounter (Signed)
Unfortunately the 5 mg is the only approved daily dose, thanks

## 2022-09-03 ENCOUNTER — Other Ambulatory Visit: Payer: Self-pay | Admitting: Internal Medicine

## 2022-09-04 ENCOUNTER — Encounter: Payer: Self-pay | Admitting: Internal Medicine

## 2022-09-05 ENCOUNTER — Other Ambulatory Visit: Payer: Self-pay | Admitting: Internal Medicine

## 2022-09-05 MED ORDER — PANTOPRAZOLE SODIUM 40 MG PO TBEC: 40.0000 mg | DELAYED_RELEASE_TABLET | Freq: Every day | ORAL | 1 refills | Status: DC

## 2022-09-05 NOTE — Telephone Encounter (Signed)
Patient is requesting pantoprazole which was last prescribed back in early 2019, LOV 11/16/21. Please advise

## 2022-09-06 ENCOUNTER — Encounter: Payer: Self-pay | Admitting: Internal Medicine

## 2022-09-07 ENCOUNTER — Other Ambulatory Visit: Payer: Self-pay

## 2022-09-07 MED ORDER — TADALAFIL 5 MG PO TABS: 5.0000 mg | ORAL_TABLET | Freq: Every day | ORAL | 3 refills | Status: AC | PRN

## 2022-09-07 MED ORDER — SILDENAFIL CITRATE 100 MG PO TABS: 100.0000 mg | ORAL_TABLET | Freq: Every day | ORAL | 11 refills | Status: AC | PRN

## 2022-09-07 NOTE — Telephone Encounter (Signed)
Ok viagra done erx

## 2022-09-07 NOTE — Telephone Encounter (Signed)
Med never refilled by PCP.Marland Kitchen Pls advise.Marland KitchenJohny Chess

## 2022-09-09 ENCOUNTER — Encounter: Payer: Self-pay | Admitting: Internal Medicine

## 2022-09-09 ENCOUNTER — Ambulatory Visit (INDEPENDENT_AMBULATORY_CARE_PROVIDER_SITE_OTHER): Payer: 59 | Admitting: Internal Medicine

## 2022-09-09 VITALS — BP 130/80 | HR 69 | Ht 68.0 in | Wt 211.0 lb

## 2022-09-09 DIAGNOSIS — R151 Fecal smearing: Secondary | ICD-10-CM

## 2022-09-09 DIAGNOSIS — R194 Change in bowel habit: Secondary | ICD-10-CM

## 2022-09-09 DIAGNOSIS — K649 Unspecified hemorrhoids: Secondary | ICD-10-CM

## 2022-09-09 DIAGNOSIS — R1084 Generalized abdominal pain: Secondary | ICD-10-CM

## 2022-09-09 NOTE — Progress Notes (Signed)
HISTORY OF PRESENT ILLNESS:  Nathaniel [name] is a 56 y.o. male with past medical history as listed below who presents today for follow-up.  He was last seen in the office July 27, 2022 regarding fecal leakage, irregular bowel habits, symptomatic hemorrhoids and increased intestinal gas.  See that dictation for details.  He was prescribed metronidazole for 10 days, Anusol suppositories, and instructed to take Citrucel daily.  Routine follow-up at this time recommended.  The patient is pleased to report that all of his previous issues have improved.  He is currently not taking Citrucel regularly.  He does report ongoing problems with some suprapubic abdominal discomfort which she notices mostly when lying down at night.  He does have bladder outlet symptoms for which she has been on Flomax, but no longer.  No weight loss or other features.  His last colonoscopy was performed October 2022.  Previous CT scan for abdominal discomfort and bloating performed March 2021 revealed urologic abnormalities.  No acute abdominal findings.  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 He has never used smokeless tobacco. He reports current alcohol use of about 4.0 standard drinks of alcohol per week. He reports that he does not use drugs.  family history includes Atrial fibrillation in his father; Prostate cancer in his maternal grandfather; Sleep apnea in his father.  No Known Allergies     PHYSICAL EXAMINATION: Vital signs: BP 130/80   Pulse 69   Ht '5\' 8"'$  (1.727 m)   Wt 211 lb (95.7 kg)   BMI 32.08 kg/m   Constitutional: generally well-appearing, no acute distress Psychiatric: alert and oriented x 3, cooperative Eyes: extraocular movements intact, anicteric, conjunctiva pink Mouth: oral pharynx moist, no lesions Neck: supple no lymphadenopathy Cardiovascular: heart regular rate and rhythm, no murmur Lungs: clear to auscultation bilaterally Abdomen: soft, nontender, nondistended, no obvious ascites, no peritoneal signs, normal bowel sounds, no organomegaly.  Very small umbilical hernia Rectal: Omitted Extremities: no clubbing, cyanosis, or lower extremity edema bilaterally Skin: no lesions on visible extremities Neuro: No focal deficits.  Cranial nerves intact  ASSESSMENT:  1.  Fecal leakage.  Improved on fiber 2.  Irregular bowel habits and increased intestinal gas improved after course of metronidazole 3.  Hemorrhoids.  Responded to medicated suppositories 4.  History of adenomatous colon polyps.  Surveillance up-to-date 5.  Chronic suprapubic discomfort noted at night.  Suspect bladder related.  Encouraged  to return to his urologist   PLAN:  1.  Continue Citrucel 2.  Anusol suppositories as needed 3.  Surveillance colonoscopy around 2027 4.  Interval follow-up as needed Total time 30 minutes spent.  See the patient, obtaining interval history, performing medically  appropriate physical exam, counseling and educating the patient regarding the above listed issues, answering questions, regarding medications as needed, and documenting clinical information in the health record

## 2022-09-09 NOTE — Patient Instructions (Signed)
_______________________________________________________  If you are age 58 or older, your body mass index should be between 23-30. Your Body mass index is 32.08 kg/m. If this is out of the aforementioned range listed, please consider follow up with your Primary Care Provider.  If you are age 82 or younger, your body mass index should be between 19-25. Your Body mass index is 32.08 kg/m. If this is out of the aformentioned range listed, please consider follow up with your Primary Care Provider.   ________________________________________________________  The [facility] providers would like to encourage you to use [facility] to communicate with providers for non-urgent requests or questions.  Due to long hold times on the telephone, sending your provider a message by [facility] may be a faster and more efficient way to get a response.  Please allow 48 business hours for a response.  Please remember that this is for non-urgent requests.  _______________________________________________________   Please follow up as needed

## 2022-10-22 DIAGNOSIS — G4733 Obstructive sleep apnea (adult) (pediatric): Secondary | ICD-10-CM | POA: Diagnosis not present

## 2022-10-24 ENCOUNTER — Encounter: Payer: Self-pay | Admitting: Neurology

## 2022-11-08 ENCOUNTER — Encounter: Payer: Self-pay | Admitting: Internal Medicine

## 2022-11-09 ENCOUNTER — Encounter: Payer: Self-pay | Admitting: Internal Medicine

## 2022-11-09 ENCOUNTER — Other Ambulatory Visit: Payer: Self-pay

## 2022-11-09 MED ORDER — METRONIDAZOLE 250 MG PO TABS: 250.0000 mg | ORAL_TABLET | Freq: Three times a day (TID) | ORAL | 0 refills | Status: DC

## 2022-11-09 MED ORDER — HYDROCORTISONE ACETATE 25 MG RE SUPP: 25.0000 mg | Freq: Every evening | RECTAL | 2 refills | Status: AC

## 2022-11-09 NOTE — Telephone Encounter (Signed)
Virtual visit unnecessary.  Recommendations: 1.  Resume Citrucel 2 tablespoons daily.  Need to be compliant 2.  Anusol HC suppository; #30; 2 refills.  Take 1 at night for 2 weeks 3.  Repeat empiric course of metronidazole 250 mg p.o. 3 times daily x 10 days 4.  Have him give [place] a progress report in 2 weeks Thanks, JP

## 2022-11-11 ENCOUNTER — Telehealth (INDEPENDENT_AMBULATORY_CARE_PROVIDER_SITE_OTHER): Payer: 59 | Admitting: Internal Medicine

## 2022-11-11 DIAGNOSIS — E538 Deficiency of other specified B group vitamins: Secondary | ICD-10-CM

## 2022-11-11 DIAGNOSIS — Z125 Encounter for screening for malignant neoplasm of prostate: Secondary | ICD-10-CM

## 2022-11-11 DIAGNOSIS — E559 Vitamin D deficiency, unspecified: Secondary | ICD-10-CM | POA: Diagnosis not present

## 2022-11-11 DIAGNOSIS — R739 Hyperglycemia, unspecified: Secondary | ICD-10-CM

## 2022-11-11 DIAGNOSIS — R051 Acute cough: Secondary | ICD-10-CM

## 2022-11-11 DIAGNOSIS — E7849 Other hyperlipidemia: Secondary | ICD-10-CM | POA: Diagnosis not present

## 2022-11-11 MED ORDER — AZITHROMYCIN 250 MG PO TABS: ORAL_TABLET | ORAL | 1 refills | Status: AC

## 2022-11-11 MED ORDER — HYDROCODONE BIT-HOMATROP MBR 5-1.5 MG/5ML PO SOLN: 5.0000 mL | Freq: Four times a day (QID) | ORAL | 0 refills | Status: AC | PRN

## 2022-11-11 NOTE — Progress Notes (Unsigned)
Patient ID: Nathaniel [name], male   DOB: 1966/02/04, 57 y.o.   MRN: [id number]  Virtual Visit via Video Note  I connected with [name] on 11/13/22 at  9:00 AM EST by a video enabled telemedicine application and verified that I am speaking with the correct person using two identifiers.  Location of all participants today Patient: at home Provider: at office   I discussed the limitations of evaluation and management by telemedicine and the availability of in person appointments. The patient expressed understanding and agreed to proceed.  History of Present Illness: Here with acute onset mild to mod 2-3 days ST, HA, general weakness and malaise, with prod cough greenish sputum, but Pt denies chest pain, increased sob or doe, wheezing, orthopnea, PND, increased LE swelling, palpitations, dizziness or syncope. Was covid neg at home yesterday, but willing to recheck again.   Pt denies polydipsia, polyuria, or new focal neuro s/s.    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 He has never used  smokeless tobacco. He reports current alcohol use of about 4.0 standard drinks of alcohol per week. He reports that he does not use drugs. family history includes Atrial fibrillation in his father; Prostate cancer in his maternal grandfather; Sleep apnea in his father. No Known Allergies Current Outpatient Medications on File Prior to Visit  Medication Sig Dispense Refill   hydrocortisone (ANUSOL-HC) 25 MG suppository Place 1 suppository (25 mg total) rectally at bedtime. 30 suppository 2   metroNIDAZOLE (FLAGYL) 250 MG tablet Take 1 tablet (250 mg total) by mouth 3 (three) times daily. 30 tablet 0   aspirin 81 MG EC tablet Take 1 tablet (81 mg total) by mouth daily. Swallow whole. 30 tablet 12   fluticasone (FLONASE) 50 MCG/ACT nasal spray Place into both nostrils daily.     hydrocortisone (ANUSOL-HC) 25 MG suppository Place 1 suppository (25 mg total) rectally at bedtime. 24 suppository 1   losartan (COZAAR) 50 MG tablet TAKE ONE TABLET BY MOUTH DAILY 90 tablet 3   metroNIDAZOLE (FLAGYL) 250 MG tablet Take 1 tablet (250 mg total) by mouth 3 (three) times daily. 30 tablet 0   pantoprazole (PROTONIX) 40 MG tablet Take 1 tablet (40 mg total) by mouth daily. 90 tablet 1   sildenafil (VIAGRA) 100 MG tablet Take 1 tablet (100 mg total) by mouth daily as needed for erectile dysfunction. 10 tablet 11   Suvorexant (BELSOMRA) 10 MG TABS 1-2 tab by mouth at bedtime as needed 60 tablet 3  tadalafil (CIALIS) 5 MG tablet Take 1 tablet (5 mg total) by mouth daily as needed for erectile dysfunction. 90 tablet 3   testosterone cypionate (DEPOTESTOSTERONE CYPIONATE) 200 MG/ML injection Inject 1 mL (200 mg total) into the muscle every 14 (fourteen) days. 10 mL 1   traZODone (DESYREL) 100 MG tablet Take 1 tablet (100 mg total) by mouth at bedtime. 90 tablet 1   No current facility-administered medications on file prior to visit.   Observations/Objective: Alert, NAD, appropriate mood and affect, resps normal, cn  2-12 intact, moves all 4s, no visible rash or swelling Lab Results  Component Value Date   WBC 7.4 11/25/2021   HGB 16.3 11/25/2021   HCT 48.6 11/25/2021   PLT 244.0 11/25/2021   GLUCOSE 99 11/25/2021   CHOL 156 11/25/2021   TRIG 158.0 (H) 11/25/2021   HDL 42.70 11/25/2021   LDLDIRECT 110.0 10/05/2016   LDLCALC 82 11/25/2021   ALT 25 11/25/2021   AST 21 11/25/2021   NA 139 11/25/2021   K 4.2 11/25/2021   CL 101 11/25/2021   CREATININE 1.05 11/25/2021   BUN 15 11/25/2021   CO2 32 11/25/2021   TSH 2.54 11/25/2021   PSA 0.58 11/25/2021   HGBA1C 5.2 11/25/2021   Assessment and Plan: See notes  Follow Up Instructions: See notes   I discussed the assessment and treatment plan with the patient. The patient was provided an opportunity to ask questions and all were answered. The patient agreed with the plan and demonstrated an understanding of the instructions.   The patient was advised to call back or seek an in-person evaluation if the symptoms worsen or if the condition fails to improve as anticipated.   [name], MD

## 2022-11-11 NOTE — Patient Instructions (Signed)
Please take all new medication as prescribed - the antibiotic, and cough medicine as needed

## 2022-11-13 ENCOUNTER — Encounter: Payer: Self-pay | Admitting: Internal Medicine

## 2022-11-13 DIAGNOSIS — R059 Cough, unspecified: Secondary | ICD-10-CM | POA: Insufficient documentation

## 2022-11-13 NOTE — Assessment & Plan Note (Signed)
Exam c/w bronchitis vs pna, pt to recheck covid at home, also zpack x 1, OTC cough med per pt preference,  to f/u any worsening symptoms or concerns

## 2022-11-13 NOTE — Assessment & Plan Note (Signed)
Lab Results  Component Value Date   HGBA1C 5.2 11/25/2021   Stable, pt to continue current medical treatment  - diet, wt control

## 2022-11-13 NOTE — Assessment & Plan Note (Signed)
Lab Results  Component Value Date   LDLCALC 82 11/25/2021   Stable, pt to continue current low chol diet

## 2022-11-13 NOTE — Assessment & Plan Note (Signed)
Last vitamin D Lab Results  Component Value Date   VD25OH 30.53 11/25/2021   Low, reminded to start oral replacement

## 2022-11-22 DIAGNOSIS — G4733 Obstructive sleep apnea (adult) (pediatric): Secondary | ICD-10-CM | POA: Diagnosis not present

## 2022-11-23 ENCOUNTER — Encounter: Payer: Self-pay | Admitting: Internal Medicine

## 2022-11-23 NOTE — Telephone Encounter (Signed)
Pt has stated "Hope all is well. The Z - pack you prescribed seemed to have worked but then over the weekend my head became very stopped up and now I am coughing and blowing my nose constantly.  Also, I now am coughing up mucus again. What would you recommend?"

## 2022-11-24 ENCOUNTER — Encounter: Payer: Self-pay | Admitting: Internal Medicine

## 2022-11-24 ENCOUNTER — Ambulatory Visit: Payer: 59 | Admitting: Internal Medicine

## 2022-11-24 NOTE — Telephone Encounter (Signed)
Please to let the front desk know that pt does not need appt today, ok to cancel and I was hoping he would not be charged the $50 no show fee

## 2022-11-25 ENCOUNTER — Other Ambulatory Visit (INDEPENDENT_AMBULATORY_CARE_PROVIDER_SITE_OTHER): Payer: 59

## 2022-11-25 DIAGNOSIS — E538 Deficiency of other specified B group vitamins: Secondary | ICD-10-CM

## 2022-11-25 DIAGNOSIS — R739 Hyperglycemia, unspecified: Secondary | ICD-10-CM | POA: Diagnosis not present

## 2022-11-25 DIAGNOSIS — Z125 Encounter for screening for malignant neoplasm of prostate: Secondary | ICD-10-CM | POA: Diagnosis not present

## 2022-11-25 DIAGNOSIS — E559 Vitamin D deficiency, unspecified: Secondary | ICD-10-CM | POA: Diagnosis not present

## 2022-11-25 DIAGNOSIS — E7849 Other hyperlipidemia: Secondary | ICD-10-CM | POA: Diagnosis not present

## 2022-11-25 LAB — HEPATIC FUNCTION PANEL
ALT: 31 U/L (ref 0–53)
AST: 22 U/L (ref 0–37)
Albumin: 4.5 g/dL (ref 3.5–5.2)
Alkaline Phosphatase: 94 U/L (ref 39–117)
Bilirubin, Direct: 0.1 mg/dL (ref 0.0–0.3)
Total Bilirubin: 0.6 mg/dL (ref 0.2–1.2)
Total Protein: 7 g/dL (ref 6.0–8.3)

## 2022-11-25 LAB — URINALYSIS, ROUTINE W REFLEX MICROSCOPIC
Bilirubin Urine: NEGATIVE
Hgb urine dipstick: NEGATIVE
Ketones, ur: NEGATIVE
Leukocytes,Ua: NEGATIVE
Nitrite: NEGATIVE
RBC / HPF: NONE SEEN (ref 0–?)
Specific Gravity, Urine: 1.01 (ref 1.000–1.030)
Total Protein, Urine: NEGATIVE
Urine Glucose: NEGATIVE
Urobilinogen, UA: 0.2 (ref 0.0–1.0)
pH: 6.5 (ref 5.0–8.0)

## 2022-11-25 LAB — BASIC METABOLIC PANEL
BUN: 13 mg/dL (ref 6–23)
CO2: 29 mEq/L (ref 19–32)
Calcium: 9.7 mg/dL (ref 8.4–10.5)
Chloride: 99 mEq/L (ref 96–112)
Creatinine, Ser: 0.95 mg/dL (ref 0.40–1.50)
GFR: 87.98 mL/min (ref >60.00)
Glucose, Bld: 89 mg/dL (ref 70–99)
Potassium: 4.3 mEq/L (ref 3.5–5.1)
Sodium: 136 mEq/L (ref 135–145)

## 2022-11-25 LAB — CBC WITH DIFFERENTIAL/PLATELET
Basophils Absolute: 0.1 10*3/uL (ref 0.0–0.1)
Basophils Relative: 0.8 % (ref 0.0–3.0)
Eosinophils Absolute: 0.2 10*3/uL (ref 0.0–0.7)
Eosinophils Relative: 2.6 % (ref 0.0–5.0)
HCT: 48.5 % (ref 39.0–52.0)
Hemoglobin: 16.7 g/dL (ref 13.0–17.0)
Lymphocytes Relative: 32.1 % (ref 12.0–46.0)
Lymphs Abs: 2.1 10*3/uL (ref 0.7–4.0)
MCHC: 34.4 g/dL (ref 30.0–36.0)
MCV: 90.3 fl (ref 78.0–100.0)
Monocytes Absolute: 0.9 10*3/uL (ref 0.1–1.0)
Monocytes Relative: 13.8 % — ABNORMAL HIGH (ref 3.0–12.0)
Neutro Abs: 3.4 10*3/uL (ref 1.4–7.7)
Neutrophils Relative %: 50.7 % (ref 43.0–77.0)
Platelets: 241 10*3/uL (ref 150.0–400.0)
RBC: 5.37 Mil/uL (ref 4.22–5.81)
RDW: 13.4 % (ref 11.5–15.5)
WBC: 6.6 10*3/uL (ref 4.0–10.5)

## 2022-11-25 LAB — LDL CHOLESTEROL, DIRECT: Direct LDL: 123 mg/dL

## 2022-11-25 LAB — PSA: PSA: 0.53 ng/mL (ref 0.10–4.00)

## 2022-11-25 LAB — HEMOGLOBIN A1C: Hgb A1c MFr Bld: 5.6 % (ref 4.6–6.5)

## 2022-11-25 LAB — TSH: TSH: 3.6 u[IU]/mL (ref 0.35–5.50)

## 2022-11-25 LAB — LIPID PANEL
Cholesterol: 192 mg/dL (ref 0–200)
HDL: 35.3 mg/dL — ABNORMAL LOW (ref >39.00)
NonHDL: 156.57
Total CHOL/HDL Ratio: 5
Triglycerides: 358 mg/dL — ABNORMAL HIGH (ref 0.0–149.0)
VLDL: 71.6 mg/dL — ABNORMAL HIGH (ref 0.0–40.0)

## 2022-11-28 LAB — VITAMIN B12: Vitamin B-12: 628 pg/mL (ref 211–911)

## 2022-11-28 LAB — VITAMIN D 25 HYDROXY (VIT D DEFICIENCY, FRACTURES): VITD: 21.14 ng/mL — ABNORMAL LOW (ref 30.00–100.00)

## 2022-11-29 ENCOUNTER — Encounter: Payer: Self-pay | Admitting: Internal Medicine

## 2022-12-01 ENCOUNTER — Encounter: Payer: Self-pay | Admitting: Internal Medicine

## 2022-12-02 NOTE — Telephone Encounter (Signed)
Please try to follow a low fat diet, and we can recheck at next visit if he likes

## 2022-12-02 NOTE — Telephone Encounter (Signed)
Patient made an appointment for March 11 and would like to know what he can do in the mean time based on my very high triglycerides, etc. Please advise if possible

## 2022-12-12 ENCOUNTER — Ambulatory Visit (INDEPENDENT_AMBULATORY_CARE_PROVIDER_SITE_OTHER): Payer: 59 | Admitting: Internal Medicine

## 2022-12-12 ENCOUNTER — Encounter: Payer: Self-pay | Admitting: Internal Medicine

## 2022-12-12 VITALS — BP 126/76 | HR 74 | Temp 98.2°F | Ht 68.0 in | Wt 214.0 lb

## 2022-12-12 DIAGNOSIS — Z0001 Encounter for general adult medical examination with abnormal findings: Secondary | ICD-10-CM | POA: Diagnosis not present

## 2022-12-12 DIAGNOSIS — E559 Vitamin D deficiency, unspecified: Secondary | ICD-10-CM | POA: Diagnosis not present

## 2022-12-12 DIAGNOSIS — I10 Essential (primary) hypertension: Secondary | ICD-10-CM

## 2022-12-12 DIAGNOSIS — R739 Hyperglycemia, unspecified: Secondary | ICD-10-CM

## 2022-12-12 DIAGNOSIS — F411 Generalized anxiety disorder: Secondary | ICD-10-CM | POA: Diagnosis not present

## 2022-12-12 DIAGNOSIS — K219 Gastro-esophageal reflux disease without esophagitis: Secondary | ICD-10-CM | POA: Diagnosis not present

## 2022-12-12 DIAGNOSIS — E7849 Other hyperlipidemia: Secondary | ICD-10-CM

## 2022-12-12 DIAGNOSIS — R9431 Abnormal electrocardiogram [ECG] [EKG]: Secondary | ICD-10-CM

## 2022-12-12 MED ORDER — PANTOPRAZOLE SODIUM 40 MG PO TBEC: 40.0000 mg | DELAYED_RELEASE_TABLET | Freq: Every day | ORAL | 3 refills | Status: DC

## 2022-12-12 NOTE — Assessment & Plan Note (Signed)
Worsening subjectively recently, declines trial ssri for now or referral counseling

## 2022-12-12 NOTE — Assessment & Plan Note (Signed)
BP Readings from Last 3 Encounters:  12/12/22 126/76  09/09/22 130/80  07/27/22 114/80   Stable, pt to continue medical treatment losartan 50 mg qd

## 2022-12-12 NOTE — Assessment & Plan Note (Addendum)
Lab Results  Component Value Date   LDLCALC 82 11/25/2021   Stable, pt to continue a lower chol diet, delcines statin for now, will recheck next visit; also for cardiac CT score

## 2022-12-12 NOTE — Addendum Note (Signed)
Addended by: Biagio Borg on: 12/12/2022 08:40 PM   Modules accepted: Orders

## 2022-12-12 NOTE — Assessment & Plan Note (Signed)
Lab Results  Component Value Date   HGBA1C 5.6 11/25/2022   Stable, pt to continue current medical treatment  - diet, wt control

## 2022-12-12 NOTE — Assessment & Plan Note (Signed)
Age and sex appropriate education and counseling updated with regular exercise and diet Referrals for preventative services - none needed Immunizations addressed - declines covid booster, for shingrix at pharmacy Smoking counseling  - none needed Evidence for depression or other mood disorder - worsening anxiety, declines trial  Most recent labs reviewed. I have personally reviewed and have noted: 1) the patient's medical and social history 2) The patient's current medications and supplements 3) The patient's height, weight, and BMI have been recorded in the chart

## 2022-12-12 NOTE — Assessment & Plan Note (Signed)
With hoarseness and cough daytime - for restart protonix 40 mg qd

## 2022-12-12 NOTE — Progress Notes (Signed)
Patient ID: Nathaniel [name], male   DOB: 1966-01-24, 57 y.o.   MRN: [id number]         Chief Complaint:: wellness exam and HLD, low vit d, htn, ADD       HPI:  Nathaniel [name] is a 57 y.o. male here for wellness exam; declines flu shot, for shingrix at the pharmacy, o/w up to date                        Also has gained several lbs recently, already lost 5 lbs in past week after he saw his previsit labs.  Not taking Vit D.  Pt denies chest pain, increased sob or doe, wheezing, orthopnea, PND, increased LE swelling, palpitations, dizziness or syncope.   Pt denies polydipsia, polyuria, or new focal neuro s/s.    Pt denies fever, wt loss, night sweats, loss of appetite, or other constitutional symptoms  ADD med tx working well recently with social and work Systems analyst.  Taking low dose vit d but not sure of the units.  Plans to work on lower fat low chol diet  Denies worsening reflux, abd pain, dysphagia, n/v, bowel change or blood, but does have recurring hoarseness and cough better before with protonix.  Denies worsening depressive symptoms, suicidal ideation, or panic; but has ongoing anxiety, increased recently for unclear reason, not sure if he wants to start ssri such as celexa.  .   Wt Readings from Last 3 Encounters:  12/12/22 214 lb (97.1 kg)  09/09/22 211 lb (95.7 kg)  07/27/22 213 lb (96.6 kg)   BP Readings from Last 3 Encounters:  12/12/22 126/76  09/09/22 130/80  07/27/22 114/80   Immunization History  Administered Date(s) Administered   Influenza Split 07/14/2006, 06/27/2008, 09/10/2009, 07/27/2010, 09/13/2012, 07/30/2015, 09/05/2016, 10/05/2016, 06/27/2017, 09/05/2018, 09/17/2018   Influenza Whole 07/14/2006, 06/27/2008, 09/10/2009, 07/27/2010   Influenza, Seasonal, Injecte, Preservative Fre 09/13/2012   Influenza,inj,Quad PF,6+ Mos 07/30/2015, 10/05/2016, 06/27/2017, 08/23/2019, 08/24/2020   Influenza-Unspecified 09/13/2012, 07/30/2015, 09/05/2016, 10/05/2016, 06/27/2017,  09/05/2018   PFIZER(Purple Top)SARS-COV-2 Vaccination 03/05/2020, 04/02/2020   Td 05/03/2007   Td (Adult),5 Lf Tetanus Toxid, Preservative Free 05/03/2007   Tdap 10/05/2016  There are no preventive care reminders to display for this patient.    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 He has never used smokeless tobacco. He reports current alcohol use of about 4.0 standard drinks of alcohol per week. He reports that he does not use drugs. family history includes Atrial fibrillation in his father; Prostate cancer in his maternal grandfather; Sleep apnea in his father. No Known  Allergies Current Outpatient Medications on File Prior to Visit  Medication Sig Dispense Refill   aspirin 81 MG EC tablet Take 1 tablet (81 mg total) by mouth daily. Swallow whole. 30 tablet 12   fluticasone (FLONASE) 50 MCG/ACT nasal spray Place into both nostrils daily.     hydrocortisone (ANUSOL-HC) 25 MG suppository Place 1 suppository (25 mg total) rectally at bedtime. 24 suppository 1   hydrocortisone (ANUSOL-HC) 25 MG suppository Place 1 suppository (25 mg total) rectally at bedtime. 30 suppository 2    losartan (COZAAR) 50 MG tablet TAKE ONE TABLET BY MOUTH DAILY 90 tablet 3   metroNIDAZOLE (FLAGYL) 250 MG tablet Take 1 tablet (250 mg total) by mouth 3 (three) times daily. 30 tablet 0   metroNIDAZOLE (FLAGYL) 250 MG tablet Take 1 tablet (250 mg total) by mouth 3 (three) times daily. 30 tablet 0   sildenafil (VIAGRA) 100 MG tablet Take 1 tablet (100 mg total) by mouth daily as needed for erectile dysfunction. 10 tablet 11   Suvorexant (BELSOMRA) 10 MG TABS 1-2 tab by mouth at bedtime as needed 60 tablet 3   tadalafil (CIALIS) 5 MG tablet Take 1 tablet (5 mg total) by mouth daily as needed for erectile dysfunction. 90 tablet 3   testosterone cypionate (DEPOTESTOSTERONE CYPIONATE) 200 MG/ML injection Inject 1 mL (200 mg total) into the muscle every 14 (fourteen) days. 10 mL 1   traZODone (DESYREL) 100 MG tablet Take 1 tablet (100 mg total) by mouth at bedtime. 90 tablet 1   No current facility-administered medications on file prior to visit.        ROS:  All others reviewed and negative.  Objective        PE:  BP 126/76   Pulse 74   Temp 98.2 F (36.8 C) (Oral)   Ht '5\' 8"'$  (1.727 m)   Wt 214 lb (97.1 kg)   SpO2 95%   BMI 32.54 kg/m                 Constitutional: Pt appears in NAD               HENT: Head: NCAT.                Right Ear: External ear normal.                 Left Ear: External ear normal.                Eyes: . Pupils are equal, round, and reactive to light. Conjunctivae and EOM are normal               Nose: without d/c or deformity               Neck: Neck supple. Gross normal ROM               Cardiovascular: Normal rate and regular rhythm.                 Pulmonary/Chest: Effort normal and breath sounds without rales or wheezing.                Abd:  Soft, NT, ND, + BS, no organomegaly               Neurological: Pt is alert. At baseline orientation, motor grossly intact               Skin: Skin is warm. No rashes, no other new lesions, LE  edema - none                Psychiatric: Pt behavior is normal without agitation   Micro: none  Cardiac tracings I have personally interpreted today:  none  Pertinent Radiological findings (summarize): none   Lab Results  Component Value Date   WBC 6.6 11/25/2022   HGB 16.7 11/25/2022   HCT 48.5 11/25/2022   PLT 241.0 11/25/2022   GLUCOSE 89 11/25/2022   CHOL 192 11/25/2022   TRIG 358.0 (H) 11/25/2022   HDL 35.30 (L) 11/25/2022   LDLDIRECT 123.0 11/25/2022   LDLCALC 82 11/25/2021   ALT 31 11/25/2022   AST 22 11/25/2022   NA 136 11/25/2022   K 4.3 11/25/2022   CL 99 11/25/2022   CREATININE 0.95 11/25/2022   BUN 13 11/25/2022   CO2 29 11/25/2022   TSH 3.60 11/25/2022   PSA 0.53 11/25/2022   HGBA1C 5.6 11/25/2022   Assessment/Plan:  Nathaniel [name] is a 57 y.o. White or Caucasian [1] male with  has a past medical history of ADD (05/03/2007), ALLERGIC RHINITIS (11/25/2007), ANXIETY (05/03/2007), CHEST PAIN (07/28/2010), Closed right ankle fracture, Diverticulitis large intestine w/o perforation or abscess w/bleeding, Diverticulosis, ERECTILE DYSFUNCTION, ORGANIC (01/06/2010), GERD (07/31/2010), HYPERLIPIDEMIA (08/01/2007), Hypertension, INSOMNIA (10/17/2007), Memory loss (11/23/2007), PSA, INCREASED (01/06/2010), and SLEEP APNEA, OBSTRUCTIVE, SEVERE (05/03/2007).  Encounter for well adult exam with abnormal findings Age and sex appropriate education and counseling updated with regular exercise and diet Referrals for preventative services - none needed Immunizations addressed - declines covid booster, for shingrix at pharmacy Smoking counseling  - none needed Evidence for depression or other mood disorder - worsening anxiety, declines trial  Most recent labs reviewed. I have personally reviewed and have noted: 1) the patient's medical and social history 2) The patient's current medications and supplements 3) The patient's height, weight, and BMI have been recorded in the chart   Essential hypertension BP  Readings from Last 3 Encounters:  12/12/22 126/76  09/09/22 130/80  07/27/22 114/80   Stable, pt to continue medical treatment losartan 50 mg qd   Hyperglycemia Lab Results  Component Value Date   HGBA1C 5.6 11/25/2022   Stable, pt to continue current medical treatment  - diet, wt control   Hyperlipidemia Lab Results  Component Value Date   LDLCALC 82 11/25/2021   Stable, pt to continue a lower chol diet, delcines statin for now, will recheck next visit; also for cardiac CT score   Anxiety state Worsening subjectively recently, declines trial ssri for now or referral counseling  GERD With hoarseness and cough daytime - for restart protonix 40 mg qd  Vitamin D deficiency Last vitamin D Lab Results  Component Value Date   VD25OH 21.14 (L) 11/25/2022   Low, to double his current oral replacement  Followup: Return in about 6 months (around 06/14/2023).  [name], MD 12/12/2022 8:35 PM [facility]

## 2022-12-12 NOTE — Assessment & Plan Note (Signed)
Last vitamin D Lab Results  Component Value Date   VD25OH 21.14 (L) 11/25/2022   Low, to double his current oral replacement

## 2022-12-12 NOTE — Patient Instructions (Addendum)
Please have your Shingrix (shingles) shots done at your local pharmacy.  Please take all new medication as prescribed - the protonix 40 mg per day  Please call if you change your mind about starting Celexa (citalopram)  Please continue all other medications as before, and refills have been done if requested.  Please have the pharmacy call with any other refills you may need.  Please continue your efforts at being more active, low cholesterol diet, and weight control.  You are otherwise up to date with prevention measures today.  Please keep your appointments with your specialists as you may have planned  You will be contacted regarding the referral for: CT Cardiac Score  Please make an Appointment to return in 6 months, or sooner if needed, also with Lab Appointment for testing done 3-5 days before at the [facility] (so this is for TWO appointments - please see the scheduling desk as you leave)

## 2022-12-21 DIAGNOSIS — G4733 Obstructive sleep apnea (adult) (pediatric): Secondary | ICD-10-CM | POA: Diagnosis not present

## 2023-03-11 ENCOUNTER — Other Ambulatory Visit: Payer: Self-pay | Admitting: Internal Medicine

## 2023-04-20 DIAGNOSIS — G4733 Obstructive sleep apnea (adult) (pediatric): Secondary | ICD-10-CM | POA: Diagnosis not present

## 2023-07-25 ENCOUNTER — Encounter: Payer: Self-pay | Admitting: Internal Medicine

## 2023-10-16 ENCOUNTER — Encounter: Payer: Self-pay | Admitting: Internal Medicine

## 2023-11-24 ENCOUNTER — Ambulatory Visit: Payer: Self-pay | Admitting: Physician Assistant

## 2023-11-24 ENCOUNTER — Ambulatory Visit (HOSPITAL_COMMUNITY): Payer: Self-pay

## 2023-11-24 ENCOUNTER — Encounter: Payer: Self-pay | Admitting: Physician Assistant

## 2023-11-24 ENCOUNTER — Telehealth: Payer: Self-pay | Admitting: Physician Assistant

## 2023-11-24 ENCOUNTER — Telehealth: Payer: Self-pay | Admitting: Internal Medicine

## 2023-11-24 VITALS — BP 132/70 | HR 84 | Ht 68.0 in | Wt 214.8 lb

## 2023-11-24 DIAGNOSIS — R110 Nausea: Secondary | ICD-10-CM

## 2023-11-24 DIAGNOSIS — Z8719 Personal history of other diseases of the digestive system: Secondary | ICD-10-CM

## 2023-11-24 DIAGNOSIS — R1084 Generalized abdominal pain: Secondary | ICD-10-CM

## 2023-11-24 DIAGNOSIS — R630 Anorexia: Secondary | ICD-10-CM

## 2023-11-24 DIAGNOSIS — R197 Diarrhea, unspecified: Secondary | ICD-10-CM

## 2023-11-24 DIAGNOSIS — R1032 Left lower quadrant pain: Secondary | ICD-10-CM

## 2023-11-24 DIAGNOSIS — G8929 Other chronic pain: Secondary | ICD-10-CM

## 2023-11-24 DIAGNOSIS — R140 Abdominal distension (gaseous): Secondary | ICD-10-CM

## 2023-11-24 DIAGNOSIS — R1030 Lower abdominal pain, unspecified: Secondary | ICD-10-CM

## 2023-11-24 MED ORDER — CIPROFLOXACIN HCL 500 MG PO TABS: 500.0000 mg | ORAL_TABLET | Freq: Two times a day (BID) | ORAL | 0 refills | Status: AC

## 2023-11-24 MED ORDER — METRONIDAZOLE 500 MG PO TABS: 500.0000 mg | ORAL_TABLET | Freq: Three times a day (TID) | ORAL | 0 refills | Status: AC

## 2023-11-24 NOTE — Telephone Encounter (Signed)
Patient called requesting to speak with a nurse stated he feels like he has Diverticulitis.

## 2023-11-24 NOTE — Progress Notes (Signed)
Reviewed. Agree with CT scan ordered STAT (stat so it is read quickly).

## 2023-11-24 NOTE — Addendum Note (Signed)
Addended by: Craig Guess on: 11/24/2023 04:44 PM   Modules accepted: Orders

## 2023-11-24 NOTE — Telephone Encounter (Signed)
Pt states he has been having lower abd pain and has a h/x of diverticulitis. He has had a perforation in the past. Pt scheduled to see Hyacinth Meeker PA today at 3pm. Pt aware of appt.

## 2023-11-24 NOTE — Telephone Encounter (Signed)
Patient called and stated that he was wanting to know where he is needing to go for his CT Scan. Patient  is requesting a call back. Please advise.

## 2023-11-24 NOTE — Telephone Encounter (Signed)
There were no appointments available over the weekend. The soonest appointment is for Monday, 11/27/23 at 5 pm at [facility]. I have attached their address and contact number below. Pt has been advised if he develops severe pain, fever, or chills over the weekend please go to ED for evaluation. Order had to be changed to Dr. Marina Goodell since Hyacinth Meeker did not come up as a provider for their facility. Kim requested that I fax order to them at [phone]. I provided kim with auth number.

## 2023-11-24 NOTE — Patient Instructions (Signed)
You have been scheduled for a CT scan of the abdomen and pelvis at [facility], 1st floor Radiology. You are scheduled on 11/24/23 at 5:15. You should arrive 15 minutes prior to your appointment time for registration.  We are giving you 2 bottles of contrast today that you will need to drink before arriving for the exam. The solution may taste better if refrigerated so put them in the refrigerator when you get home, but do NOT add ice or any other liquid to this solution as that would dilute it. Shake well before drinking.   Please follow the written instructions below on the day of your exam:   1) Do not eat anything 4 hours prior to your test  You may take any medications as prescribed with a small amount of water, if necessary. If you take any of the following medications: METFORMIN, GLUCOPHAGE, GLUCOVANCE, AVANDAMET, RIOMET, FORTAMET, ACTOPLUS MET, JANUMET, GLUMETZA or METAGLIP, you MAY be asked to HOLD this medication 48 hours AFTER the exam.   The purpose of you drinking the oral contrast is to aid in the visualization of your intestinal tract. The contrast solution may cause some diarrhea. Depending on your individual set of symptoms, you may also receive an intravenous injection of x-ray contrast/dye. Plan on being at [facility] for 45 minutes or longer, depending on the type of exam you are having performed.   If you have any questions regarding your exam or if you need to reschedule, you may call [facility] at [phone] between the hours of 8:00 am and 5:00 pm, Monday-Friday.   _______________________________________________________  If your blood pressure at your visit was 140/90 or greater, please contact your primary care physician to follow up on this.  _______________________________________________________  If you are age 53 or older, your body mass index should be between 23-30. Your Body mass index is 32.66 kg/m. If this is out of the aforementioned range  listed, please consider follow up with your Primary Care Provider.  If you are age 63 or younger, your body mass index should be between 19-25. Your Body mass index is 32.66 kg/m. If this is out of the aformentioned range listed, please consider follow up with your Primary Care Provider.   ________________________________________________________  The [facility] providers would like to encourage you to use [facility] to communicate with providers for non-urgent requests or questions.  Due to long hold times on the telephone, sending your provider a message by [facility] may be a faster and more efficient way to get a response.  Please allow 48 business hours for a response.  Please remember that this is for non-urgent requests.  _______________________________________________________  Thank you for trusting me with your gastrointestinal care!   Vicente Serene, PA

## 2023-11-24 NOTE — Telephone Encounter (Signed)
It looks like [facility] is showing up out of network with the pt's plan, the two facilities in network are [facility] in [place] and [facility]. Pt would like to know if he could be accommodated at any of these two locations for insurance purposes.  [facility] [address]  [place] , [place] [address] Phone: [phone] Fax: [phone]  [facility] [address]  [place] , [place] [address] Phone: [phone] Fax: [phone]

## 2023-11-24 NOTE — Progress Notes (Signed)
Chief Complaint: Lower abdominal pain and a history of diverticulitis  HPI:    [name] is a 58 year old male with a past medical history as listed below including OSA on CPAP, known to Dr. Marina Goodell, who presents to clinic today with a complaint of lower abdominal pain and a history of diverticulitis.    04/26/2017 CT the abdomen pelvis with acute sigmoid diverticulitis with perforation and a small pericolonic abscess with air and fluid.    12/05/2019 CT the abdomen pelvis with contrast showed persistent right mid and distal hydroureter extending to the level of the UVJ.  Considerable narrowing of the thecal sac of L4-5.  Sigmoid diverticula.    07/19/2021 colonoscopy with three 3-5 mm polyps in the rectum and ascending colon, diverticulosis in the sigmoid colon internal hemorrhoids.  Repeat recommended 5 years.    09/09/2022 patient seen in clinic by Dr. Marina Goodell for fecal leakage/smearing.  This is all improved after using Metronidazole for 10 days with Anusol suppositories and Citrucel.  At that time recommended Anusol suppositories as needed continue Citrucel and schedule repeat surveillance colonoscopy 2027.    11/24/2023 patient seen by urgent care.  Described abdominal pain in upper part of his abdomen that now localized to the lower end of the right side of his abdomen for 4 days.  He wanted to have a CT scan to rule out diverticulitis.    Today, patient presents to clinic and tells me that he has been having his chronic GI issues but they have gotten worse over the past week.  Tells me that he typically has some generalized abdominal discomfort and bloating/gas but over the past week since Monday he has had an increased amount of pain which seem to have started around the top of his abdomen and now is more in bilateral lower quadrants and sometimes shoots down into his groin.  He tells me that standing seems to be more comfortable than sitting at this moment.  Has also had increased diarrheal  stools over the past couple of days but is still also passing some normal stools here and there.  Has also noted increase in gas and feels like his appetite is just decreased in general and he "does not feel good".  Tells me he might of had some chills last night but no documented fever.  Tells me he is worried that he might have diverticulitis again and does not wanted to get to the point where it is perforated like his first presentation back in 2018.  He has stopped his fiber supplement.    Does ask if alcohol could affect his symptoms, typically has 4-5 vodka tonic tonics on the weekends.    Denies fever, weight loss, vomiting or symptoms that awaken him from sleep.     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   GERD 07/31/2010   HYPERLIPIDEMIA 08/01/2007   Hypertension    INSOMNIA 10/17/2007   Memory loss 11/23/2007   PSA, INCREASED 01/06/2010   SLEEP APNEA, OBSTRUCTIVE, SEVERE 05/03/2007   uses CPAP nightly    Past Surgical History:  Procedure Laterality Date   NASAL SINUS SURGERY     dr Jearld Fenton   ORIF ANKLE FRACTURE Right 05/04/2020   Procedure:  OPEN REDUCTION INTERNAL FIXATION (ORIF) ANKLE FRACTURE;  Surgeon: Beverely Low, MD;  Location: [facility];  Service: Orthopedics;  Laterality: Right;   TONSILLECTOMY      Current Outpatient Medications  Medication Sig Dispense Refill   aspirin 81 MG EC tablet Take 1 tablet (81 mg total) by mouth daily. Swallow whole. 30 tablet 12   fluticasone (FLONASE) 50 MCG/ACT nasal spray Place into both nostrils daily.     hydrocortisone (ANUSOL-HC) 25 MG suppository Place 1 suppository (25 mg total) rectally at bedtime. 24 suppository 1   hydrocortisone (ANUSOL-HC) 25 MG suppository Place  1 suppository (25 mg total) rectally at bedtime. 30 suppository 2   losartan (COZAAR) 50 MG tablet TAKE 1 TABLET BY MOUTH DAILY 90 tablet 2   metroNIDAZOLE (FLAGYL) 250 MG tablet Take 1 tablet (250 mg total) by mouth 3 (three) times daily. 30 tablet 0   metroNIDAZOLE (FLAGYL) 250 MG tablet Take 1 tablet (250 mg total) by mouth 3 (three) times daily. 30 tablet 0   pantoprazole (PROTONIX) 40 MG tablet Take 1 tablet (40 mg total) by mouth daily. 90 tablet 3   sildenafil (VIAGRA) 100 MG tablet Take 1 tablet (100 mg total) by mouth daily as needed for erectile dysfunction. 10 tablet 11   Suvorexant (BELSOMRA) 10 MG TABS 1-2 tab by mouth at bedtime as needed 60 tablet 3   tadalafil (CIALIS) 5 MG tablet Take 1 tablet (5 mg total) by mouth daily as needed for erectile dysfunction. 90 tablet 3   testosterone cypionate (DEPOTESTOSTERONE CYPIONATE) 200 MG/ML injection Inject 1 mL (200 mg total) into the muscle every 14 (fourteen) days. 10 mL 1   traZODone (DESYREL) 100 MG tablet Take 1 tablet (100 mg total) by mouth at bedtime. 90 tablet 1   No current facility-administered medications for this visit.    Allergies as of 11/24/2023   (No Known Allergies)    Family History  Problem Relation Age of Onset   Sleep apnea Father    Atrial fibrillation Father    Prostate cancer Maternal Grandfather    Colon cancer Neg Hx    Rectal cancer Neg Hx    Stomach cancer Neg Hx     Social History   Socioeconomic History   Marital status: Divorced    Spouse name: Not on file   Number of children: 3   Years of education: Not on file   Highest education level: Not on file  Occupational History   Occupation: Multimedia programmer estate  Tobacco Use   Smoking status: Never   Smokeless tobacco: Never  Vaping Use   Vaping status: Never Used  Substance and Sexual Activity   Alcohol use: Yes    Alcohol/week: 4.0 standard drinks of alcohol    Types: 4 Cans of beer per week    Comment: socially on weekends    Drug use: No   Sexual activity: Not on file  Other Topics Concern   Not on file  Social History Narrative   Not on file   Social Drivers of Health   Financial Resource Strain: Not on file  Food Insecurity: Not on file  Transportation Needs: Not on file  Physical Activity: Not on file  Stress: Not on file  Social Connections: Not on file  Intimate Partner Violence: Not on file    Review of Systems:    Constitutional: No weight loss Skin: No rash  Cardiovascular: No chest pain   Respiratory: No SOB  Gastrointestinal: See HPI and  otherwise negative Genitourinary: No dysuria  Neurological: No headache, dizziness or syncope Musculoskeletal: No new muscle or joint pain Hematologic: No bleeding Psychiatric: No history of depression or anxiety   Physical Exam:  Vital signs: BP 132/70 (BP Location: Left Arm, Patient Position: Sitting, Cuff Size: Normal)   Pulse 84   Ht 5\' 8"  (1.727 m)   Wt 214 lb 12.8 oz (97.4 kg)   BMI 32.66 kg/m    Constitutional:   Pleasant overweight Caucasian male appears to be in NAD, Well developed, Well nourished, alert and cooperative Head:  Normocephalic and atraumatic. Eyes:   PEERL, EOMI. No icterus. Conjunctiva pink. Ears:  Normal auditory acuity. Neck:  Supple Throat: Oral cavity and pharynx without inflammation, swelling or lesion.  Respiratory: Respirations even and unlabored. Lungs clear to auscultation bilaterally.   No wheezes, crackles, or rhonchi.  Cardiovascular: Normal S1, S2. No MRG. Regular rate and rhythm. No peripheral edema, cyanosis or pallor.  Gastrointestinal:  Soft, nondistended, mild generalized ttp. No rebound or guarding. Normal bowel sounds. No appreciable masses or hepatomegaly. Rectal:  Not performed.  Msk:  Symmetrical without gross deformities. Without edema, no deformity or joint abnormality.  Neurologic:  Alert and  oriented x4;  grossly normal neurologically.  Skin:   Dry and intact without significant lesions or  rashes. Psychiatric: Demonstrates good judgement and reason without abnormal affect or behaviors.  RELEVANT LABS AND IMAGING: CBC    Component Value Date/Time   WBC 6.6 11/25/2022 1619   RBC 5.37 11/25/2022 1619   HGB 16.7 11/25/2022 1619   HCT 48.5 11/25/2022 1619   PLT 241.0 11/25/2022 1619   MCV 90.3 11/25/2022 1619   MCH 29.5 04/30/2017 0529   MCHC 34.4 11/25/2022 1619   RDW 13.4 11/25/2022 1619   LYMPHSABS 2.1 11/25/2022 1619   MONOABS 0.9 11/25/2022 1619   EOSABS 0.2 11/25/2022 1619   BASOSABS 0.1 11/25/2022 1619    CMP     Component Value Date/Time   NA 136 11/25/2022 1619   K 4.3 11/25/2022 1619   CL 99 11/25/2022 1619   CO2 29 11/25/2022 1619   GLUCOSE 89 11/25/2022 1619   BUN 13 11/25/2022 1619   CREATININE 0.95 11/25/2022 1619   CALCIUM 9.7 11/25/2022 1619   PROT 7.0 11/25/2022 1619   ALBUMIN 4.5 11/25/2022 1619   AST 22 11/25/2022 1619   ALT 31 11/25/2022 1619   ALKPHOS 94 11/25/2022 1619   BILITOT 0.6 11/25/2022 1619   GFRNONAA >60 04/29/2017 0608   GFRAA >60 04/29/2017 1610    Assessment: 1.  Generalized abdominal/lower abdominal pain: Has been treated before in the past for irritable bowel type symptoms with IBgard and empiric Flagyl for SIBO, does recall maybe feeling a little bit better for a time and then symptoms seem to come back, over the past week more severe, does have history of perforated diverticulitis with abscess back in 2018; concern for diverticulitis versus possibly ongoing IBS 2.  Diarrhea: Over the past 2 to 3 days is noticed an increase in diarrheal stool and just does not feel well; likely with above 3.  Decreased appetite 4.  Bloating  Plan: 1.  Scheduled patient for a stat CT of the abdomen pelvis with contrast for concern of diverticulitis. 2.  Ordered labs today including CBC and CMP 3.  Continue IBgard 4.  Recommend the patient start a liquid diet for the next 2 to 3 days and maintain regular bowel habits.  If he feels he is  not going  enough he needs to start taking MiraLAX daily. 5.  Pending results from CT scan could try some Dicyclomine for abdominal discomfort, at this moment we will hold off as this can also cause some decrease in stooling and constipation which should be contraindicated if we are suspecting diverticulitis. 6.  Patient to follow in clinic per recommendations after labs and imaging.  Did go ahead and get him a follow-up in the next 2 to 3 months.  I feel like he has some baseline chronic GI issues which need further workup. 7.  Did give ER precautions if he starts running a fever has increased pain, nausea or vomiting.  Hyacinth Meeker, PA-C [facility] 11/24/2023, 3:05 PM  Cc: Corwin Levins, MD

## 2023-11-25 NOTE — Telephone Encounter (Signed)
 JLL, Please get involved, if necessary. Thanks

## 2023-11-27 NOTE — Telephone Encounter (Signed)
 Called and spoke with patient. Pt states that he is doing better, "medication seems to be working". Pt rescheduled his CT appt to tomorrow at 4 pm at [facility] in [place]. Pt is aware that we will be in touch once we have results. Pt verbalized understanding and had no concerns at the end of the call.

## 2023-11-29 ENCOUNTER — Telehealth: Payer: Self-pay

## 2023-11-29 NOTE — Telephone Encounter (Signed)
 Pt called into the office to inquire about results of CT scan that was completed yesterday. Results are in Care Everywhere. Please advise, thanks.

## 2023-12-09 ENCOUNTER — Other Ambulatory Visit: Payer: Self-pay | Admitting: Internal Medicine

## 2023-12-11 ENCOUNTER — Other Ambulatory Visit: Payer: Self-pay

## 2023-12-14 ENCOUNTER — Other Ambulatory Visit: Payer: Self-pay | Admitting: Internal Medicine

## 2023-12-14 ENCOUNTER — Other Ambulatory Visit: Payer: Self-pay

## 2024-02-14 ENCOUNTER — Encounter: Payer: Self-pay | Admitting: Internal Medicine

## 2024-02-21 ENCOUNTER — Encounter: Payer: Self-pay | Admitting: Internal Medicine

## 2024-02-21 ENCOUNTER — Ambulatory Visit (INDEPENDENT_AMBULATORY_CARE_PROVIDER_SITE_OTHER): Payer: Self-pay | Admitting: Internal Medicine

## 2024-02-21 VITALS — BP 122/80 | HR 76 | Wt 212.0 lb

## 2024-02-21 DIAGNOSIS — R109 Unspecified abdominal pain: Secondary | ICD-10-CM

## 2024-02-21 DIAGNOSIS — R140 Abdominal distension (gaseous): Secondary | ICD-10-CM

## 2024-02-21 DIAGNOSIS — R151 Fecal smearing: Secondary | ICD-10-CM

## 2024-02-21 DIAGNOSIS — R1030 Lower abdominal pain, unspecified: Secondary | ICD-10-CM

## 2024-02-21 DIAGNOSIS — Z8719 Personal history of other diseases of the digestive system: Secondary | ICD-10-CM

## 2024-02-21 DIAGNOSIS — R159 Full incontinence of feces: Secondary | ICD-10-CM

## 2024-02-21 DIAGNOSIS — K219 Gastro-esophageal reflux disease without esophagitis: Secondary | ICD-10-CM

## 2024-02-21 DIAGNOSIS — Z860101 Personal history of adenomatous and serrated colon polyps: Secondary | ICD-10-CM

## 2024-02-21 MED ORDER — METRONIDAZOLE 250 MG PO TABS
250.0000 mg | ORAL_TABLET | Freq: Three times a day (TID) | ORAL | 0 refills | Status: AC
Start: 2024-02-21 — End: ?

## 2024-02-21 NOTE — Progress Notes (Signed)
 HISTORY OF PRESENT ILLNESS:  Nathaniel [name] [name] is a 58 y.o. male with past medical history as listed below who is followed in this office for history of complicated diverticulitis, abdominal complaints, and fecal seepage.  She was last seen in this office by the GI physician assistant on November 24, 2023.  Issues at that time were generalized lower abdominal discomfort, diarrhea, bloating, decreased appetite.  A CT scan of the abdomen and pelvis was performed December 05, 2019.  No evidence for diverticulitis.  He was noted to have persistent right-sided hydroureter which is chronic.  Other incidental findings as noted.  Blood work including comprehensive metabolic panel and CBC were unremarkable.  Other historical background as follows:  -04/26/2017 CT the abdomen pelvis with acute sigmoid diverticulitis with perforation and a small pericolonic abscess with air and fluid.  -12/05/2019 CT the abdomen pelvis with contrast showed persistent right mid and distal hydroureter extending to the level of the UVJ.  Considerable narrowing of the thecal sac of L4-5.  Sigmoid diverticula.  -07/19/2021 colonoscopy with three 3-5 mm polyps in the rectum and ascending colon, diverticulosis in the sigmoid colon internal hemorrhoids.  Repeat recommended 5 years.  -09/09/2022 patient seen in clinic by Dr. Elvin Hammer for fecal leakage/smearing.  This is all improved after using Metronidazole  for 10 days with Anusol  suppositories and Citrucel.  At that time recommended Anusol  suppositories as needed continue Citrucel and schedule repeat surveillance colonoscopy 2027.  -11/24/2023 patient seen by urgent care.  Described abdominal pain in upper part of his abdomen that now localized to the lower end of the right side of his abdomen for 4 days.  He wanted to have a CT scan to rule out diverticulitis.  Patient presents today for follow-up.  He reports ongoing lower abdominal discomfort with bloating discomfort.  Has had weight gain.  Also  reports recurrent fecal leakage.  No longer taking fiber.  Does feel that occasional courses of antibiotics has helped with lower abdominal discomfort/bloating symptoms.  His last complete colonoscopy was performed July 19, 2021.  In addition to diverticulosis and internal hemorrhoids he was noted to have a tubular adenoma and sessile serrated polyp.  Random colon biopsies were normal.  Follow-up in 5 years recommended.  He does tell me that due to insurance preference, he may need to switch his specialty health care elsewhere.  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   GERD 07/31/2010   HYPERLIPIDEMIA 08/01/2007   Hypertension    INSOMNIA 10/17/2007   Memory loss 11/23/2007   PSA, INCREASED 01/06/2010   SLEEP APNEA, OBSTRUCTIVE, SEVERE 05/03/2007   uses CPAP nightly    Past Surgical History:  Procedure Laterality Date   NASAL SINUS SURGERY     dr Virgia Griffins   ORIF ANKLE FRACTURE Right 05/04/2020   Procedure: OPEN REDUCTION INTERNAL FIXATION (ORIF) ANKLE FRACTURE;  Surgeon: Winston Hawking, MD;  Location: [facility];  Service: Orthopedics;  Laterality: Right;   TONSILLECTOMY      Social History Nathaniel [name]  reports that he has never smoked. He has never used smokeless tobacco. He reports  current alcohol use of about 4.0 standard drinks of alcohol per week. He reports that he does not use drugs.  family history includes Atrial fibrillation in his father; Prostate cancer in his maternal grandfather; Sleep apnea in his father.  No Known Allergies     PHYSICAL EXAMINATION: Vital signs: BP  122/80   Pulse 76   Wt 212 lb (96.2 kg)   BMI 32.23 kg/m   Constitutional: generally well-appearing, no acute distress Psychiatric: alert and oriented x3, cooperative Eyes: extraocular movements intact, anicteric, conjunctiva pink Mouth: oral pharynx moist, no lesions Neck: supple no lymphadenopathy Cardiovascular: heart regular rate and rhythm, no murmur Lungs: clear to auscultation bilaterally Abdomen: soft, nontender, nondistended, no obvious ascites, no peritoneal signs, normal bowel sounds, no organomegaly.  Small umbilical hernia Rectal: Omitted Extremities: no clubbing, cyanosis, or lower extremity edema bilaterally Skin: no lesions on visible extremities Neuro: No focal deficits.  Cranial nerves  ASSESSMENT:  1.  History of perforated diverticulitis.  No evidence of active diverticulitis at this time. 2.  Chronic problems with lower abdominal bloating discomfort. 3.  Fecal leakage 4.  History of adenomatous and sessile serrated polyps. 5.  GERD.  Symptoms controlled with PPI   PLAN:  1.  For fecal leakage recommend Citrucel 2 tablespoons daily 2.  Prescribe metronidazole  250 mg p.o. 3 times daily x 1 week for bloating discomfort possibly related to bacterial overgrowth. 3.  Surveillance colonoscopy around October 2027.  I did provide the patient a copy of his last colonoscopy report as well as his pathology, should he switch specialty providers.  He understands his follow-up interval 4.  Reflux precautions 5.  Weight loss 6.  Continue pantoprazole  7.  Resume general medical care with PCP.  GI follow-up as needed

## 2024-02-21 NOTE — Patient Instructions (Signed)
 We have sent the following medications to your pharmacy for you to pick up at your convenience:  Metronidazole   Take 2 tablespoons of Citrucel daily.  _______________________________________________________  If your blood pressure at your visit was 140/90 or greater, please contact your primary care physician to follow up on this.  _______________________________________________________  If you are age 60 or older, your body mass index should be between 23-30. Your Body mass index is 32.23 kg/m. If this is out of the aforementioned range listed, please consider follow up with your Primary Care Provider.  If you are age 26 or younger, your body mass index should be between 19-25. Your Body mass index is 32.23 kg/m. If this is out of the aformentioned range listed, please consider follow up with your Primary Care Provider.   ________________________________________________________  The [facility] providers would like to encourage you to use MYCHART to communicate with providers for non-urgent requests or questions.  Due to long hold times on the telephone, sending your provider a message by [facility] may be a faster and more efficient way to get a response.  Please allow 48 business hours for a response.  Please remember that this is for non-urgent requests.  _______________________________________________________
